# Patient Record
Sex: Male | Born: 1960
Health system: Southern US, Community
[De-identification: ages and names within clinical notes are randomized; demographics above are authoritative.]

## PROBLEM LIST (undated history)

## (undated) DIAGNOSIS — T7840XA Allergy, unspecified, initial encounter: Secondary | ICD-10-CM

## (undated) DIAGNOSIS — D239 Other benign neoplasm of skin, unspecified: Secondary | ICD-10-CM

## (undated) DIAGNOSIS — D179 Benign lipomatous neoplasm, unspecified: Secondary | ICD-10-CM

## (undated) DIAGNOSIS — I10 Essential (primary) hypertension: Secondary | ICD-10-CM

## (undated) DIAGNOSIS — K2210 Ulcer of esophagus without bleeding: Secondary | ICD-10-CM

## (undated) DIAGNOSIS — K219 Gastro-esophageal reflux disease without esophagitis: Secondary | ICD-10-CM

## (undated) HISTORY — PX: SHOULDER SURGERY: SHX246

## (undated) HISTORY — DX: Other benign neoplasm of skin, unspecified: D23.9

## (undated) HISTORY — PX: COLONOSCOPY: SHX174

## (undated) HISTORY — DX: Benign lipomatous neoplasm, unspecified: D17.9

## (undated) HISTORY — DX: Gastro-esophageal reflux disease without esophagitis: K21.9

## (undated) HISTORY — DX: Ulcer of esophagus without bleeding: K22.10

## (undated) HISTORY — DX: Allergy, unspecified, initial encounter: T78.40XA

## (undated) HISTORY — DX: Essential (primary) hypertension: I10

## (undated) HISTORY — PX: BICEPS TENDON REPAIR: SHX566

## (undated) HISTORY — PX: KNEE SURGERY: SHX244

---

## 1999-10-16 ENCOUNTER — Encounter: Admission: RE | Admit: 1999-10-16 | Discharge: 1999-11-15 | Payer: Self-pay | Admitting: Family Medicine

## 2004-07-10 ENCOUNTER — Ambulatory Visit: Payer: Self-pay | Admitting: Family Medicine

## 2004-10-21 ENCOUNTER — Ambulatory Visit: Payer: Self-pay | Admitting: Family Medicine

## 2004-12-03 ENCOUNTER — Ambulatory Visit: Payer: Self-pay | Admitting: Family Medicine

## 2005-02-06 ENCOUNTER — Ambulatory Visit: Payer: Self-pay | Admitting: Family Medicine

## 2006-04-15 ENCOUNTER — Ambulatory Visit: Payer: Self-pay | Admitting: Family Medicine

## 2006-04-29 ENCOUNTER — Ambulatory Visit: Payer: Self-pay | Admitting: Gastroenterology

## 2006-05-20 ENCOUNTER — Encounter (INDEPENDENT_AMBULATORY_CARE_PROVIDER_SITE_OTHER): Payer: Self-pay | Admitting: *Deleted

## 2006-05-20 ENCOUNTER — Ambulatory Visit: Payer: Self-pay | Admitting: Gastroenterology

## 2006-07-14 ENCOUNTER — Ambulatory Visit: Payer: Self-pay | Admitting: Gastroenterology

## 2006-07-29 ENCOUNTER — Ambulatory Visit: Payer: Self-pay | Admitting: Family Medicine

## 2006-08-28 ENCOUNTER — Ambulatory Visit: Payer: Self-pay | Admitting: Family Medicine

## 2007-01-13 ENCOUNTER — Ambulatory Visit: Payer: Self-pay | Admitting: Family Medicine

## 2007-01-13 LAB — CONVERTED CEMR LAB
ALT: 38 units/L (ref 0–40)
AST: 41 units/L — ABNORMAL HIGH (ref 0–37)
Alkaline Phosphatase: 44 units/L (ref 39–117)
BUN: 17 mg/dL (ref 6–23)
Basophils Absolute: 0 10*3/uL (ref 0.0–0.1)
Basophils Relative: 0.2 % (ref 0.0–1.0)
CO2: 34 meq/L — ABNORMAL HIGH (ref 19–32)
Cholesterol: 228 mg/dL (ref 0–200)
Creatinine, Ser: 0.9 mg/dL (ref 0.4–1.5)
Eosinophils Absolute: 0.3 10*3/uL (ref 0.0–0.6)
Eosinophils Relative: 6.4 % — ABNORMAL HIGH (ref 0.0–5.0)
HDL: 38.2 mg/dL — ABNORMAL LOW (ref >39.0)
MCHC: 35.6 g/dL (ref 30.0–36.0)
MCV: 83.9 fL (ref 78.0–100.0)
Monocytes Absolute: 0.4 10*3/uL (ref 0.2–0.7)
Neutro Abs: 2.8 10*3/uL (ref 1.4–7.7)
Platelets: 200 10*3/uL (ref 150–400)
Sodium: 143 meq/L (ref 135–145)
Total Protein: 7.5 g/dL (ref 6.0–8.3)
Triglycerides: 129 mg/dL (ref 0–149)
WBC: 5.1 10*3/uL (ref 4.5–10.5)

## 2007-01-21 ENCOUNTER — Ambulatory Visit: Payer: Self-pay | Admitting: Family Medicine

## 2007-02-10 ENCOUNTER — Ambulatory Visit: Payer: Self-pay | Admitting: Family Medicine

## 2007-02-22 ENCOUNTER — Encounter: Payer: Self-pay | Admitting: Family Medicine

## 2007-02-22 DIAGNOSIS — J309 Allergic rhinitis, unspecified: Secondary | ICD-10-CM

## 2007-02-22 DIAGNOSIS — I10 Essential (primary) hypertension: Secondary | ICD-10-CM | POA: Insufficient documentation

## 2007-02-22 DIAGNOSIS — J45909 Unspecified asthma, uncomplicated: Secondary | ICD-10-CM

## 2007-03-17 ENCOUNTER — Ambulatory Visit: Payer: Self-pay | Admitting: Family Medicine

## 2007-03-17 DIAGNOSIS — F528 Other sexual dysfunction not due to a substance or known physiological condition: Secondary | ICD-10-CM

## 2007-07-16 DIAGNOSIS — J069 Acute upper respiratory infection, unspecified: Secondary | ICD-10-CM | POA: Insufficient documentation

## 2007-07-20 ENCOUNTER — Ambulatory Visit: Payer: Self-pay | Admitting: Family Medicine

## 2007-11-21 DIAGNOSIS — J029 Acute pharyngitis, unspecified: Secondary | ICD-10-CM | POA: Insufficient documentation

## 2007-11-22 ENCOUNTER — Ambulatory Visit: Payer: Self-pay | Admitting: Family Medicine

## 2007-11-22 LAB — CONVERTED CEMR LAB: Rapid Strep: NEGATIVE

## 2008-01-14 ENCOUNTER — Ambulatory Visit: Payer: Self-pay | Admitting: Family Medicine

## 2008-01-14 LAB — CONVERTED CEMR LAB
ALT: 26 units/L (ref 0–53)
BUN: 14 mg/dL (ref 6–23)
Basophils Absolute: 0 10*3/uL (ref 0.0–0.1)
Calcium: 9.3 mg/dL (ref 8.4–10.5)
Creatinine, Ser: 0.8 mg/dL (ref 0.4–1.5)
Eosinophils Relative: 2.9 % (ref 0.0–5.0)
GFR calc Af Amer: 134 mL/min
HCT: 43.1 % (ref 39.0–52.0)
LDL Cholesterol: 131 mg/dL — ABNORMAL HIGH (ref 0–99)
Lymphocytes Relative: 20.9 % (ref 12.0–46.0)
MCHC: 34.9 g/dL (ref 30.0–36.0)
Neutro Abs: 4.7 10*3/uL (ref 1.4–7.7)
Neutrophils Relative %: 69.1 % (ref 43.0–77.0)
Potassium: 4.4 meq/L (ref 3.5–5.1)
Sodium: 140 meq/L (ref 135–145)
Total CHOL/HDL Ratio: 5.8
Triglycerides: 144 mg/dL (ref 0–149)
VLDL: 29 mg/dL (ref 0–40)
WBC: 6.8 10*3/uL (ref 4.5–10.5)

## 2008-01-20 ENCOUNTER — Encounter: Payer: Self-pay | Admitting: Family Medicine

## 2008-01-21 ENCOUNTER — Ambulatory Visit: Payer: Self-pay | Admitting: Family Medicine

## 2008-01-21 DIAGNOSIS — R0789 Other chest pain: Secondary | ICD-10-CM | POA: Insufficient documentation

## 2008-01-24 ENCOUNTER — Telehealth: Payer: Self-pay | Admitting: *Deleted

## 2008-01-24 ENCOUNTER — Telehealth (INDEPENDENT_AMBULATORY_CARE_PROVIDER_SITE_OTHER): Payer: Self-pay | Admitting: *Deleted

## 2008-01-28 ENCOUNTER — Ambulatory Visit: Payer: Self-pay | Admitting: Family Medicine

## 2008-01-31 ENCOUNTER — Ambulatory Visit: Payer: Self-pay | Admitting: Family Medicine

## 2008-01-31 DIAGNOSIS — D179 Benign lipomatous neoplasm, unspecified: Secondary | ICD-10-CM

## 2008-02-05 DIAGNOSIS — I781 Nevus, non-neoplastic: Secondary | ICD-10-CM

## 2008-02-07 ENCOUNTER — Ambulatory Visit: Payer: Self-pay | Admitting: Family Medicine

## 2008-05-08 ENCOUNTER — Ambulatory Visit: Payer: Self-pay | Admitting: Family Medicine

## 2009-06-21 ENCOUNTER — Ambulatory Visit: Payer: Self-pay | Admitting: Family Medicine

## 2009-06-21 DIAGNOSIS — F325 Major depressive disorder, single episode, in full remission: Secondary | ICD-10-CM | POA: Insufficient documentation

## 2009-06-28 ENCOUNTER — Ambulatory Visit: Payer: Self-pay | Admitting: Family Medicine

## 2009-07-23 ENCOUNTER — Ambulatory Visit: Payer: Self-pay | Admitting: Family Medicine

## 2009-07-23 LAB — CONVERTED CEMR LAB
Albumin: 4.2 g/dL (ref 3.5–5.2)
BUN: 22 mg/dL (ref 6–23)
Basophils Absolute: 0 10*3/uL (ref 0.0–0.1)
Basophils Relative: 0.7 % (ref 0.0–3.0)
CO2: 32 meq/L (ref 19–32)
Calcium: 9 mg/dL (ref 8.4–10.5)
Cholesterol: 193 mg/dL (ref 0–200)
Eosinophils Absolute: 0.2 10*3/uL (ref 0.0–0.7)
Eosinophils Relative: 5.7 % — ABNORMAL HIGH (ref 0.0–5.0)
Glucose, Bld: 94 mg/dL (ref 70–99)
HCT: 46 % (ref 39.0–52.0)
HDL: 39.4 mg/dL (ref >39.00)
Ketones, ur: NEGATIVE mg/dL
LDL Cholesterol: 140 mg/dL — ABNORMAL HIGH (ref 0–99)
Leukocytes, UA: NEGATIVE
Monocytes Relative: 17.3 % — ABNORMAL HIGH (ref 3.0–12.0)
Neutro Abs: 1.3 10*3/uL — ABNORMAL LOW (ref 1.4–7.7)
Neutrophils Relative %: 42.4 % — ABNORMAL LOW (ref 43.0–77.0)
Potassium: 4 meq/L (ref 3.5–5.1)
Sodium: 142 meq/L (ref 135–145)
Specific Gravity, Urine: >=1.03 (ref 1.000–1.030)
TSH: 0.65 microintl units/mL (ref 0.35–5.50)
Total Bilirubin: 1.5 mg/dL — ABNORMAL HIGH (ref 0.3–1.2)
Total CHOL/HDL Ratio: 5
Urobilinogen, UA: 0.2 (ref 0.0–1.0)
VLDL: 14 mg/dL (ref 0.0–40.0)

## 2009-07-30 ENCOUNTER — Ambulatory Visit: Payer: Self-pay | Admitting: Family Medicine

## 2009-07-30 DIAGNOSIS — R195 Other fecal abnormalities: Secondary | ICD-10-CM

## 2009-07-31 ENCOUNTER — Encounter (INDEPENDENT_AMBULATORY_CARE_PROVIDER_SITE_OTHER): Payer: Self-pay | Admitting: *Deleted

## 2009-09-19 ENCOUNTER — Encounter (INDEPENDENT_AMBULATORY_CARE_PROVIDER_SITE_OTHER): Payer: Self-pay | Admitting: *Deleted

## 2009-09-19 ENCOUNTER — Ambulatory Visit: Payer: Self-pay | Admitting: Gastroenterology

## 2009-09-19 DIAGNOSIS — K921 Melena: Secondary | ICD-10-CM

## 2009-09-19 DIAGNOSIS — K219 Gastro-esophageal reflux disease without esophagitis: Secondary | ICD-10-CM

## 2009-11-06 ENCOUNTER — Ambulatory Visit: Payer: Self-pay | Admitting: Gastroenterology

## 2009-11-06 LAB — HM COLONOSCOPY

## 2009-11-07 ENCOUNTER — Encounter: Payer: Self-pay | Admitting: Gastroenterology

## 2009-11-07 ENCOUNTER — Telehealth (INDEPENDENT_AMBULATORY_CARE_PROVIDER_SITE_OTHER): Payer: Self-pay | Admitting: *Deleted

## 2009-11-22 ENCOUNTER — Ambulatory Visit (HOSPITAL_COMMUNITY): Admission: RE | Admit: 2009-11-22 | Discharge: 2009-11-22 | Payer: Self-pay | Admitting: Gastroenterology

## 2010-09-10 NOTE — Letter (Signed)
Summary: [facility] Instructions  [facility]  [address] [place], [place] [address]   Phone: [phone]  Fax: [phone]       Eduardo Turner    Jan 29, 1961    MRN: [id number]        Procedure Day /Date: Tuesday March 8th, 2011     Arrival Time: 1:30pm     Procedure Time: 2:30pm     Location of Procedure:                    _x _  [facility] (4th Floor)                        PREPARATION FOR COLONOSCOPY WITH MOVIPREP   Starting 5 days prior to your procedure  10/11/09 do not eat nuts, seeds, popcorn, corn, beans, peas,  salads, or any raw vegetables.  Do not take any fiber supplements (e.g. Metamucil, Citrucel, and Benefiber).  THE DAY BEFORE YOUR PROCEDURE         DATE:  10/15/09   DAY:  Monday   1.  Drink clear liquids the entire day-NO SOLID FOOD  2.  Do not drink anything colored red or purple.  Avoid juices with pulp.  No orange juice.  3.  Drink at least 64 oz. (8 glasses) of fluid/clear liquids during the day to prevent dehydration and help the prep work efficiently.  CLEAR LIQUIDS INCLUDE: Water Jello Ice Popsicles Tea (sugar ok, no milk/cream) Powdered fruit flavored drinks Coffee (sugar ok, no milk/cream) Gatorade Juice: apple, white grape, white cranberry  Lemonade Clear bullion, consomm, broth Carbonated beverages (any kind) Strained chicken noodle soup Hard Candy                             4.  In the morning, mix first dose of MoviPrep solution:    Empty 1 Pouch A and 1 Pouch B into the disposable container    Add lukewarm drinking water to the top line of the container. Mix to dissolve    Refrigerate (mixed solution should be used within 24 hrs)  5.  Begin drinking the prep at 5:00 p.m. The MoviPrep container is divided by 4 marks.   Every 15 minutes drink the solution down to the next mark (approximately 8 oz) until the full liter is complete.   6.  Follow completed prep with 16 oz of clear liquid of your choice  (Nothing red or purple).  Continue to drink clear liquids until bedtime.  7.  Before going to bed, mix second dose of MoviPrep solution:    Empty 1 Pouch A and 1 Pouch B into the disposable container    Add lukewarm drinking water to the top line of the container. Mix to dissolve    Refrigerate  THE DAY OF YOUR PROCEDURE      DATE:  10/16/09  DAY:  Tuesday  Beginning at  9:30 a.m. (5 hours before procedure):         1. Every 15 minutes, drink the solution down to the next mark (approx 8 oz) until the full liter is complete.  2. Follow completed prep with 16 oz. of clear liquid of your choice.    3. You may drink clear liquids until  12:30pm  (2 HOURS BEFORE PROCEDURE).   MEDICATION INSTRUCTIONS  Unless otherwise instructed, you should take regular prescription medications with a small sip of  water   as early as possible the morning of your procedure.           OTHER INSTRUCTIONS  You will need a responsible adult at least 49 years of age to accompany you and drive you home.   This person must remain in the waiting room during your procedure.  Wear loose fitting clothing that is easily removed.  Leave jewelry and other valuables at home.  However, you may wish to bring a book to read or  an iPod/MP3 player to listen to music as you wait for your procedure to start.  Remove all body piercing jewelry and leave at home.  Total time from sign-in until discharge is approximately 2-3 hours.  You should go home directly after your procedure and rest.  You can resume normal activities the  day after your procedure.  The day of your procedure you should not:   Drive   Make legal decisions   Operate machinery   Drink alcohol   Return to work  You will receive specific instructions about eating, activities and medications before you leave.    The above instructions have been reviewed and explained to me by   Marchelle Folks.     I fully understand and can verbalize these  instructions _____________________________ Date _________

## 2010-09-10 NOTE — Procedures (Signed)
Summary: Colonoscopy  Patient: Eduardo Turner Note: All result statuses are Final unless otherwise noted.  Tests: (1) Colonoscopy (COL)   COL Colonoscopy           DONE     [facility]     [address]     [place], [place]  [address]           COLONOSCOPY PROCEDURE REPORT           PATIENT:  Eduardo Turner, Eduardo Turner  MR#:  [id number]     BIRTHDATE:  [birth year], 48 yrs. old  GENDER:  male           ENDOSCOPIST:  Judie Petit T. [name], MD, [facility]     Referred by:  Eugenio Hoes Tawanna Cooler, M.D.           PROCEDURE DATE:  11/06/2009     PROCEDURE:  Colonoscopy 19147     ASA CLASS:  Class II     INDICATIONS:  1) hematochezia           MEDICATIONS:   Fentanyl 125 mcg IV, Versed 13 mg IV           DESCRIPTION OF PROCEDURE:   After the risks benefits and     alternatives of the procedure were thoroughly explained, informed     consent was obtained.  Digital rectal exam was performed and     revealed no abnormalities.  The LB PCF-[id number] endoscope     was introduced through the anus and advanced to the hepatic     flexure, limited by a tortuous and redundant colon, and patient     discomfort.  The quality of the prep was excellent, using     MoviPrep.  The instrument was then slowly withdrawn as the colon     was fully examined.     <<PROCEDUREIMAGES>>           FINDINGS:  The hepatic flexure, transverse, splenic flexure,     descending, sigmoid colon, and rectum appeared unremarkable.     Retroflexed views in the rectum revealed no abnormalities.  The     time to cecum =  18.25  minutes. The scope was then withdrawn     (time =  7  min) from the patient and the procedure completed.           COMPLICATIONS:  None           ENDOSCOPIC IMPRESSION:     1) Normal colon           RECOMMENDATIONS:     1) Air Contrast Barium Enema in 2-3 weeks           Cymone Yeske T. [name], MD, [facility] Graham           n.     eSIGNED:   Venita Lick. Lis Savitt at 11/06/2009 03:15 PM           Ramond Dial, [id number]  Note:  An exclamation mark (!) indicates a result that was not dispersed into the flowsheet. Document Creation Date: 11/06/2009 3:16 PM _______________________________________________________________________  (1) Order result status: Final Collection or observation date-time: 11/06/2009 15:07 Requested date-time:  Receipt date-time:  Reported date-time:  Referring Physician:   Ordering Physician: Claudette Head [phone]) Specimen Source:  Source: Launa Grill Order Number: [id number] Lab site:

## 2010-09-10 NOTE — Assessment & Plan Note (Signed)
Summary: INTERMITTENT RECTAL BLEED X12MONTHS/YF   History of Present Illness Visit Type: Initial Consult Primary GI MD: Elie Goody MD [facility] Primary Provider: Kelle Darting, MD Requesting Provider: Kelle Darting, MD Chief Complaint: Patient complains of rectal bleeding that started about 6 months ago, which has increased recently. He denies any other complaints, no constipation, no hemorrhoids.  History of Present Illness:   This is a 50 year oldwhite male who relates an increasing frequency of bright red blood per rectum year. He is noted. The symptoms for about 6 months, but they have increased in the past few weeks. He frequently notes blood on the tissue and in the commode. He has a history of a peptic esophageal stricture and has breakthrough reflux symptoms occurring about every 2-3 days.   GI Review of Systems      Denies abdominal pain, acid reflux, belching, bloating, chest pain, dysphagia with liquids, dysphagia with solids, heartburn, loss of appetite, nausea, vomiting, vomiting blood, weight loss, and  weight gain.      Reports rectal bleeding.     Denies anal fissure, black tarry stools, change in bowel habit, constipation, diarrhea, diverticulosis, fecal incontinence, heme positive stool, hemorrhoids, irritable bowel syndrome, jaundice, light color stool, liver problems, and  rectal pain.   Current Medications (verified): 1)  Azmacort 75 Mcg/act Aers (Triamcinolone Acetonide) .... Prn 2)  Maxair Autohaler 200 Mcg/inh Aerb (Pirbuterol Acetate) .... Prn 3)  Prilosec 20 Mg Cpdr (Omeprazole) .... One Daily 4)  Claritin 10 Mg  Caps (Loratadine) .... As Needed For Seasonal Allergies 5)  Lisinopril 20 Mg  Tabs (Lisinopril) .... Take 1 Tablet By Mouth Every Morning 6)  Celexa 20 Mg Tabs (Citalopram Hydrobromide) .[name].. 1 Tab @ Bedtime  Allergies (verified): 1)  Erythromycin Ethylsuccinate  Past History:  Past Medical History: Allergic  rhinitis Asthma Hypertension lipomas Dysplastic nevi Peptic esophageal stricture GERD  Past Surgical History: bilateral SHOULDER SURG  Family History: Reviewed history from 09/18/2009 and no changes required. late 50s in good health.  Mother, her mid 28s in good health.  No brothers.  One sister in good health  bipolar depression No FH of Colon Cancer  Social History: Occupation: Production designer, theatre/television/film at [employer] Married Never Smoked Drug use-no Regular exercise-yes Alcohol Use - yes social  Daily Caffeine Use 4-5 cans per day  Review of Systems       The patient complains of fatigue, headaches-new, muscle pains/cramps, shortness of breath, sleeping problems, and urination - excessive.         The pertinent positives and negatives are noted as above and in the HPI. All other ROS were reviewed and were negative.   Vital Signs:  Patient profile:   50 year old male Height:      69.5 inches Weight:      219.0 pounds BMI:     31.99 Pulse rate:   64 / minute Pulse rhythm:   regular BP sitting:   114 / 74  (left arm) Cuff size:   regular  Vitals Entered By: Harlow Mares CMA Duncan Dull) (September 19, 2009 1:50 PM)  Physical Exam  General:  Well developed, well nourished, no acute distress. Head:  Normocephalic and atraumatic. Eyes:  PERRLA, no icterus. Ears:  Normal auditory acuity. Mouth:  No deformity or lesions, dentition normal. Neck:  Supple; no masses or thyromegaly. Lungs:  Clear throughout to auscultation. Heart:  Regular rate and rhythm; no murmurs, rubs,  or bruits. Abdomen:  Soft, nontender and nondistended. No masses, hepatosplenomegaly or hernias  noted. Normal bowel sounds. Rectal:  deferred until time of colonoscopy.  DRE performed by Dr. Tawanna Cooler 6 weeks ago showed no lesions and Hemoccult-positive stool. Msk:  Symmetrical with no gross deformities. Normal posture. Pulses:  Normal pulses noted. Extremities:  No clubbing, cyanosis, edema or deformities noted. Neurologic:   Alert and  oriented x4;  grossly normal neurologically. Skin:  Intact without significant lesions or rashes. Cervical Nodes:  No significant cervical adenopathy. Axillary Nodes:  No significant axillary adenopathy. Psych:  Alert and cooperative. Normal mood and affect.  Impression & Recommendations:  Problem # 1:  HEMATOCHEZIA (ICD-578.1) Worsening small-volume hematochezia. Rule out hemorrhoids, proctitis, colorectal neoplasms, and other disorders. The risks, benefits and alternatives to colonoscopy with possible biopsy, possible destruction of hemorrhoids and possible polypectomy were discussed with the patient and they consent to proceed. The procedure will be scheduled electively. Orders: Colonoscopy (Colon)  Problem # 2:  BLOOD IN STOOL, OCCULT (ICD-792.1) As in problem #1.  Problem # 3:  GERD (ICD-530.81) GERD with a history of prior peptic stricture. Frequent breakthrough symptoms. Intensify antireflux measures. Change omeprazole 40 mg q.a.m.  Patient Instructions: 1)  Colonoscopy brochure given.  2)  Conscious Sedation brochure given.  3)  Pick up your prescription at your pharmacy.  4)  Copy sent to : Kelle Darting, MD 5)  The medication list was reviewed and reconciled.  All changed / newly prescribed medications were explained.  A complete medication list was provided to the patient / caregiver. 6)  Avoid foods high in acid content ( tomatoes, citrus juices, spicy foods) . Avoid eating within 3 to 4 hours of lying down or before exercising. Do not over eat; try smaller more frequent meals. Elevate head of bed four inches when sleeping.   Prescriptions: OMEPRAZOLE 40 MG CPDR (OMEPRAZOLE) one tablet by mouth once daily  #30 x 11   Entered by:   Christie Nottingham CMA (AAMA)   Authorized by:   Meryl Dare MD [facility]   Signed by:   Meryl Dare MD [facility] on 09/19/2009   Method used:   Electronically to        [facility]* (retail)       [address] [place], [place]  [address]       Ph: [phone]       Fax: [phone]   RxID:   [id number] MOVIPREP 100 GM  SOLR (PEG-KCL-NACL-NASULF-NA ASC-C) As per prep instructions.  #1 x 0   Entered by:   Christie Nottingham CMA (AAMA)   Authorized by:   Meryl Dare MD [facility]   Signed by:   Meryl Dare MD [facility] on 09/19/2009   Method used:   Electronically to        [facility]* (retail)       [address] [place], [place]  [address]       Ph: [phone]       Fax: [phone]   RxID:   [id number]

## 2010-09-10 NOTE — Miscellaneous (Signed)
Summary: ac. Barium Enema order  Clinical Lists Changes  Orders: Added new Test order of Air Contrast  BE (Air Contrast BE) - Signed

## 2010-09-10 NOTE — Procedures (Signed)
Summary: EGD and biopsy   EGD  Procedure date:  05/20/2006  Findings:      Findings: Stricture:  Location: [facility]   Patient Name: Eduardo Turner, Eduardo Turner. MRN:  Procedure Procedures: Panendoscopy (EGD) CPT: 43235.    with biopsy(s)/brushing(s). CPT: D1846139.    with esophageal dilation. CPT: G9296129.  Personnel: Endoscopist: Venita Lick. [name], MD, [facility] Graham.  Referred By: Eugenio Hoes Tawanna Cooler, MD.  Exam Location: Exam performed in Outpatient Clinic. Outpatient  Patient Consent: Procedure, Alternatives, Risks and Benefits discussed, consent obtained, from patient. Consent was obtained by the RN.  Indications Symptoms: Dysphagia. Reflux symptoms  History  Current Medications: Patient is not currently taking Coumadin.  Pre-Exam Physical: Performed May 20, 2006  Cardio-pulmonary exam, HEENT exam, Abdominal exam, Mental status exam WNL.  Comments: Pt. history reviewed/updated, physical exam performed prior to initiation of sedation?Yes Exam Exam Info: Maximum depth of insertion Duodenum, intended Duodenum. Vocal cords not visualized. Gastric retroflexion performed. ASA Classification: II. Tolerance: excellent.  Sedation Meds: Patient assessed and found to be appropriate for moderate (conscious) sedation. Fentanyl 75 mcg. given IV. Versed 7 mg. given IV. Cetacaine Spray 2 sprays given aerosolized.  Monitoring: BP and pulse monitoring done. Oximetry used. Supplemental O2 given  Findings Normal: Proximal Esophagus to Mid Esophagus.  STRICTURE / STENOSIS: Stricture in Distal Esophagus.  39 cm from mouth. Lumen diameter is 15 mm. Biopsy of Stricture/Steno  taken. ICD9: Esophageal Stricture: 530.3.  - Dilation: Distal Esophagus. Savary dilator used, Diameter: 16 mm, Minimal Resistance, No Heme present on extraction. Savary dilator used, Diameter: 17 mm, Minimal Resistance, No Heme present on extraction. Patient tolerance excellent. Outcome: successful.  Normal: Cardia to  Duodenal 2nd Portion.   Assessment  Diagnoses: 530.3: Esophageal Stricture.   Events  Unplanned Intervention: No unplanned interventions were required.  Unplanned Events: There were no complications. Plans Instructions: Nothing to eat or drink for 1 hour.  Clear or full liquids: 1 hour.  Medication(s): Await pathology. PPI: QAM, for indefinitely.   Patient Education: Patient given standard instructions for: Reflux. Stenosis / Stricture.  Disposition: After procedure patient sent to recovery. After recovery patient sent home.  Scheduling: Office Visit, to [employer]. [name], MD, [facility], around Jun 20, 2006.    This report was created from the original endoscopy report, which was reviewed and signed by the above listed endoscopist.    cc: Tinnie Gens A. Tawanna Cooler, MD       SP Surgical Pathology - STATUS: Final             By: Gwenlyn Saran  ,        Perform Date: 10Oct07 00:01  Ordered By: Rica Records Date: 11Oct07 14:39  Facility: [facility]                               Department: CPATH  Service Report Text  [facility]   [address] [address]   [place], [place] [address]   Telephone [phone] or [phone] Fax [phone]    REPORT OF SURGICAL PATHOLOGY    Case #: [id number]   Patient Name: Eduardo Turner   Office Chart Number: [id number]    MRN: [id number]   Pathologist: Berneta Levins, MD   DOB/Age 07-Jul-1961 (Age: 50) Gender: M   Date Taken: 05/20/2006   Date Received: 05/21/2006    FINAL DIAGNOSIS    ***  MICROSCOPIC EXAMINATION AND DIAGNOSIS***    ESOPHAGUS: FINDINGS CONSISTENT WITH GASTROESOPHAGEAL REFLUX. NO   INTESTINAL METAPLASIA, DYSPLASIA OR MALIGNANCY IDENTIFIED.   (BIOPSIES, DISTAL STRICTURE)    COMMENT   An Alcian Blue stain is performed to determine the presence of   intestinal metaplasia (goblet cell metaplasia). No intestinal   metaplasia (goblet cell metaplasia) is identified with the  Alcian   Blue stain. The control stained appropriately. There is   gastroesophageal junction type mucosa associated with mild   inflammation. Intraepithelial eosinophils are seen in the   squamous component consistent with gastroesophageal reflux   disease in the appropriate clinical setting. There is no atypia   or evidence of malignancy. (JAS:mj 05/22/06)    mj   Date Reported: 05/22/2006 Berneta Levins, MD   *** Electronically Signed Out By JAS ***    Clinical information   R/O peptic stricture; dysphagia/GERD (gt)    specimen(s) obtained   Esophagus, biopsy, distal stricture    Gross Description   Received in formalin are tan, soft tissue fragments that are   submitted in toto. Number: three   Size: 0.1 to 0.4 cm. block summary: toto one cassette. (JBM:gt,   05/21/06)    gdt/

## 2010-09-10 NOTE — Progress Notes (Signed)
   Phone Note Outgoing Call   Summary of Call: Pt. contacted and scheduled for [facility] Barium Enema on 11/22/09 at 9 a.m.per order of Dr.Stark.

## 2010-09-20 ENCOUNTER — Encounter: Payer: Self-pay | Admitting: Family Medicine

## 2010-09-20 ENCOUNTER — Ambulatory Visit (INDEPENDENT_AMBULATORY_CARE_PROVIDER_SITE_OTHER): Payer: BC Managed Care – PPO | Admitting: Family Medicine

## 2010-09-20 VITALS — BP 120/88 | Temp 97.9°F | Ht 69.0 in | Wt 236.0 lb

## 2010-09-20 DIAGNOSIS — G43109 Migraine with aura, not intractable, without status migrainosus: Secondary | ICD-10-CM

## 2010-09-20 DIAGNOSIS — G43809 Other migraine, not intractable, without status migrainosus: Secondary | ICD-10-CM

## 2010-09-20 NOTE — Progress Notes (Signed)
  Subjective:    Patient ID: Eduardo Turner, male    DOB: 11-12-1960, 50 y.o.   MRN: [id number]  HPICarl is a 50 year old male, who comes in today for evaluation of ophthalmic migraine.  He drinks 5 cans of caffeinated Coca-Cola per day.  This morning at work.  He had a 15 minute episodes of blurred vision without a headache.  It went away and then came back.  Again, no headache.  Neurologic review of systems negative except that he states 20 years ago.  He had increased CSF pressure from taking steroids for asthma and had to have a spinal tap to relieve the pressure.  He set a history of many migraines in the past.  Negative, except for above    Review of Systems     Objective:   Physical Exam    Well-developed well-nourished, male in no acute distress.  Examination of the HEENT was negative.  Neck was supple.  Neurologic exam normal.  Disk flat    Assessment & Plan:  Ophthalmic migraine.  Caffeine abuse.  Plan slowly decreased her caffeine over a couple weeks.  Return p.r.n.

## 2010-09-20 NOTE — Patient Instructions (Signed)
Slowly decreased her caffeine consumption over the next couple weeks, and the symptoms u described  should go away if they do not have become worse and let us know

## 2010-09-25 ENCOUNTER — Other Ambulatory Visit: Payer: Self-pay | Admitting: Family Medicine

## 2010-09-25 ENCOUNTER — Telehealth: Payer: Self-pay | Admitting: Family Medicine

## 2010-09-25 ENCOUNTER — Telehealth: Payer: Self-pay | Admitting: *Deleted

## 2010-09-25 DIAGNOSIS — L509 Urticaria, unspecified: Secondary | ICD-10-CM

## 2010-09-25 MED ORDER — PREDNISONE 20 MG PO TABS: 20.0000 mg | ORAL_TABLET | Freq: Every day | ORAL | Status: AC

## 2010-09-25 NOTE — Telephone Encounter (Signed)
Please advise 

## 2010-09-25 NOTE — Telephone Encounter (Signed)
Left message on machine for patient and rx sent

## 2010-09-25 NOTE — Telephone Encounter (Signed)
Has poison ivy, requesting rx sent to [facility] in [place]

## 2010-09-25 NOTE — Telephone Encounter (Signed)
Prednisone 20 mg, dispense 30 tabs directions two tabs x 3 days, one x 3 days, a half x 3 days, then half a tablet Monday, Wednesday, Friday, for a two week taper, refills x 1 

## 2010-09-25 NOTE — Telephone Encounter (Signed)
Appt scheduled for today.

## 2010-09-26 ENCOUNTER — Ambulatory Visit: Payer: BC Managed Care – PPO | Admitting: Family Medicine

## 2010-10-28 ENCOUNTER — Other Ambulatory Visit: Payer: Self-pay | Admitting: Family Medicine

## 2010-11-05 ENCOUNTER — Other Ambulatory Visit: Payer: Self-pay | Admitting: Gastroenterology

## 2010-12-25 ENCOUNTER — Other Ambulatory Visit: Payer: Self-pay | Admitting: Family Medicine

## 2010-12-27 NOTE — Assessment & Plan Note (Signed)
[facility]                           GASTROENTEROLOGY OFFICE NOTE   Eduardo Turner, Eduardo Turner                        MRN:          [id number]  DATE:04/29/2006                            DOB:          [birth year]    REFERRING PHYSICIAN:  Tinnie Gens A. Tawanna Cooler, MD   REASON FOR REFERRAL:  Dysphagia and heartburn.   HISTORY OF PRESENT ILLNESS:  Eduardo Turner is a very nice 50 year old white  male referred through the courtesy of Eduardo Turner.  He relates a  history of reflux symptoms for about the past 2-3 years.  He has been taking  either generic omeprazole or Prilosec OTC for about the past 2 years with  fairly good control of his symptoms.  Over the past several months, though  he does not occasionally breakthrough symptoms despite remaining on Prilosec  quite regularly.  Over the past 2 months he has had several episodes of  solid food dysphagia.  These symptoms are associated with significant chest  discomfort until the food passes.  He has noted occasional mild  periumbilical pain and some rare episodes of a scant amount of blood on the  tissue paper occurring maybe every 3-4 months.  He is most concerned about  his swallowing difficulties and chest pain.  He notes no change in stool  caliber, odynophagia, or weight loss.  There is no family history of colon  cancer, colon polyps, or inflammatory bowel disease.   CURRENT MEDICATIONS:  Listed on the chart are being reviewed.   MEDICATION ALLERGIES:  ERYTHROMYCIN.   PAST MEDICAL HISTORY:  1. Asthma.  2. Allergies.  3. GERD.  4. History of an arm fracture as a child.   SOCIAL HISTORY:  Per the handwritten evaluation form.   REVIEW OF SYSTEMS:  Per the handwritten evaluation form.   PHYSICAL EXAMINATION:  GENERAL:  No acute distress.  VITAL SIGNS:  Height 5 feet 9 inches, weight 215.4 pounds, blood pressure  128/86, pulse 70 regular.  HEENT:  Anicteric sclerae, oropharynx clear.  CHEST:  Clear to  auscultation bilaterally.  CARDIAC:  Regular rate and rhythm without murmurs.  ABDOMEN:  Soft, nontender, nondistended.  Normoactive bowel sounds.  No  palpable organomegaly, masses, or hernias.  EXTREMITIES:  Without clubbing, cyanosis, or edema.  NEUROLOGIC:  Alert and oriented x3.  Grossly nonfocal.   ASSESSMENT/PLAN:  Chronic GERD with new onset solid food dysphagia.  Rule  out peptic strictures and esophagitis.  Discontinue Prilosec and begin  Prevacid 30 mg p.o. q.a.m.  Begin all standard anti-reflux measures.  Risks,  benefits and alternatives to upper endoscopy with possible biopsy, possible  dilation discussed with the patient and he consents to proceed.  This will  be scheduled electively.                                   Venita Lick. [name], MD, [facility]   MTS/MedQ  DD:  04/29/2006  DT:  05/01/2006  Job #:  [id number]   cc:   Eduardo Gens A. Tawanna Cooler,  MD

## 2010-12-27 NOTE — Assessment & Plan Note (Signed)
[name]                         GASTROENTEROLOGY OFFICE NOTE   Eduardo Turner, Eduardo Turner                        MRN:          [id number]  DATE:07/14/2006                            DOB:          [birth year]    This is a return office visit for reflux and dysphagia.  Upper endoscopy  revealed a peptic stricture, which was dilated.  Biopsies revealed  evidence of GERD with no other abnormalities.  He has had an excellent  response to dilation and notes no dysphagia.  His reflux symptoms are  under very good control on daily Prilosec OTC.   CURRENT MEDICATIONS:  Listed on the chart, updated, and reviewed.   MEDICATION ALLERGIES:  ERYTHROMYCIN.   EXAM:  In no acute distress.  Weight 221 pounds.  Blood pressure is 136/80, pulse 68 and regular.  CHEST:  Clear to auscultation bilaterally.  CARDIAC:  Regular rate and rhythm without murmurs.  ABDOMEN:  Soft and nontender with normoactive bowel sounds.   ASSESSMENT AND PLAN:  Gastroesophageal reflux disease complicated by a  peptic stricture.  Maintain standard antireflux measures and a proton  pump inhibitor on a daily basis, long term.  Return office visit with me  p.r.n.  Ongoing followup with Dr. Kelle Darting.     Venita Lick. [name], MD, [facility]  Electronically Signed    MTS/MedQ  DD: 07/14/2006  DT: 07/14/2006  Job #: [id number]   cc:   Tinnie Gens A. Tawanna Cooler, MD

## 2011-01-23 ENCOUNTER — Other Ambulatory Visit: Payer: BC Managed Care – PPO

## 2011-01-30 ENCOUNTER — Encounter: Payer: BC Managed Care – PPO | Admitting: Family Medicine

## 2011-03-12 ENCOUNTER — Other Ambulatory Visit (INDEPENDENT_AMBULATORY_CARE_PROVIDER_SITE_OTHER): Payer: BC Managed Care – PPO

## 2011-03-12 DIAGNOSIS — Z0000 Encounter for general adult medical examination without abnormal findings: Secondary | ICD-10-CM

## 2011-03-12 LAB — BASIC METABOLIC PANEL
BUN: 18 mg/dL (ref 6–23)
Calcium: 8.8 mg/dL (ref 8.4–10.5)
Chloride: 100 mEq/L (ref 96–112)
Glucose, Bld: 83 mg/dL (ref 70–99)
Sodium: 140 mEq/L (ref 135–145)

## 2011-03-12 LAB — CBC WITH DIFFERENTIAL/PLATELET
HCT: 44.6 % (ref 39.0–52.0)
Lymphocytes Relative: 24.3 % (ref 12.0–46.0)
Lymphs Abs: 1.8 10*3/uL (ref 0.7–4.0)
Monocytes Relative: 5.6 % (ref 3.0–12.0)
Neutrophils Relative %: 66 % (ref 43.0–77.0)
RBC: 5.13 Mil/uL (ref 4.22–5.81)
WBC: 7.3 10*3/uL (ref 4.5–10.5)

## 2011-03-12 LAB — POCT URINALYSIS DIPSTICK
Glucose, UA: NEGATIVE
Spec Grav, UA: 1.03

## 2011-03-12 LAB — LIPID PANEL
Cholesterol: 220 mg/dL — ABNORMAL HIGH (ref 0–200)
Triglycerides: 217 mg/dL — ABNORMAL HIGH (ref 0.0–149.0)
VLDL: 43.4 mg/dL — ABNORMAL HIGH (ref 0.0–40.0)

## 2011-03-13 ENCOUNTER — Other Ambulatory Visit: Payer: BC Managed Care – PPO

## 2011-03-13 LAB — HEPATIC FUNCTION PANEL
AST: 26 U/L (ref 0–37)
Alkaline Phosphatase: 47 U/L (ref 39–117)
Total Bilirubin: 0.8 mg/dL (ref 0.3–1.2)

## 2011-03-27 ENCOUNTER — Ambulatory Visit (INDEPENDENT_AMBULATORY_CARE_PROVIDER_SITE_OTHER): Payer: BC Managed Care – PPO | Admitting: Family Medicine

## 2011-03-27 ENCOUNTER — Encounter: Payer: Self-pay | Admitting: Family Medicine

## 2011-03-27 DIAGNOSIS — J309 Allergic rhinitis, unspecified: Secondary | ICD-10-CM

## 2011-03-27 DIAGNOSIS — K219 Gastro-esophageal reflux disease without esophagitis: Secondary | ICD-10-CM

## 2011-03-27 DIAGNOSIS — Z0000 Encounter for general adult medical examination without abnormal findings: Secondary | ICD-10-CM

## 2011-03-27 DIAGNOSIS — F329 Major depressive disorder, single episode, unspecified: Secondary | ICD-10-CM

## 2011-03-27 DIAGNOSIS — I10 Essential (primary) hypertension: Secondary | ICD-10-CM

## 2011-03-27 DIAGNOSIS — F3289 Other specified depressive episodes: Secondary | ICD-10-CM

## 2011-03-27 MED ORDER — CITALOPRAM HYDROBROMIDE 20 MG PO TABS: 20.0000 mg | ORAL_TABLET | Freq: Every day | ORAL | Status: DC

## 2011-03-27 MED ORDER — LISINOPRIL 20 MG PO TABS: 20.0000 mg | ORAL_TABLET | Freq: Every day | ORAL | Status: DC

## 2011-03-27 NOTE — Progress Notes (Signed)
  Subjective:    Patient ID: Eduardo Turner, male    DOB: 08-24-1960, 50 y.o.   MRN: [id number]  HPIkarl Is a 50 year old, married male, nonsmoker, who comes in today for evaluation of depression, hypertension, allergic rhinitis and reflux esophagitis.  He takes Celexa 20 mg nightly for mild depression.  Doing well.  He takes 20 mg of lisinopril daily.  BP 120/88.  He takes OTC Claritin 10 mg plain for allergic rhinitis.  He takes OTC Prilosec 40 mg daily for reflux.  Review of systems negative except 49 year old daughter Jerrel Ivory is not doing well in school and is changed.  Groups.  Advised to see Dr.  Marvell Fuller  for evaluation immediately    Review of Systems  Constitutional: Negative.   HENT: Negative.   Eyes: Negative.   Respiratory: Negative.   Cardiovascular: Negative.   Gastrointestinal: Negative.   Genitourinary: Negative.   Musculoskeletal: Negative.   Skin: Negative.   Neurological: Negative.   Hematological: Negative.   Psychiatric/Behavioral: Negative.        Objective:   Physical Exam  Constitutional: He is oriented to person, place, and time. He appears well-developed and well-nourished.  HENT:  Head: Normocephalic and atraumatic.  Right Ear: External ear normal.  Left Ear: External ear normal.  Nose: Nose normal.  Mouth/Throat: Oropharynx is clear and moist.  Eyes: Conjunctivae and EOM are normal. Pupils are equal, round, and reactive to light.  Neck: Normal range of motion. Neck supple. No JVD present. No tracheal deviation present. No thyromegaly present.  Cardiovascular: Normal rate, regular rhythm, normal heart sounds and intact distal pulses.  Exam reveals no gallop and no friction rub.   No murmur heard. Pulmonary/Chest: Effort normal and breath sounds normal. No stridor. No respiratory distress. He has no wheezes. He has no rales. He exhibits no tenderness.  Abdominal: Soft. Bowel sounds are normal. He exhibits no distension and no mass. There is no  tenderness. There is no rebound and no guarding.  Genitourinary: Rectum normal, prostate normal and penis normal. Guaiac negative stool. No penile tenderness.  Musculoskeletal: Normal range of motion. He exhibits no edema and no tenderness.  Lymphadenopathy:    He has no cervical adenopathy.  Neurological: He is alert and oriented to person, place, and time. He has normal reflexes. No cranial nerve deficit. He exhibits normal muscle tone.  Skin: Skin is warm and dry. No rash noted. No erythema. No pallor.  Psychiatric: He has a normal mood and affect. His behavior is normal. Judgment and thought content normal.          Assessment & Plan:  Healthy male.  History of mild depression continue Celexa 20 nightly  Hypertension.  Continue lisinopril 20 daily  Allergic rhinitis.  Continue Claritin 10 daily.  Reflux esophagitis.  Continue on omeprazole 40 daily OTC.  Return in one year, sooner if any problems

## 2011-03-27 NOTE — Patient Instructions (Signed)
Continue your current medications.  Return in one year, sooner for any problems.  Take a baby aspirin daily

## 2011-05-05 ENCOUNTER — Ambulatory Visit (INDEPENDENT_AMBULATORY_CARE_PROVIDER_SITE_OTHER): Payer: BC Managed Care – PPO | Admitting: Family Medicine

## 2011-05-05 ENCOUNTER — Encounter: Payer: Self-pay | Admitting: Family Medicine

## 2011-05-05 VITALS — BP 120/76 | HR 72 | Temp 98.5°F | Wt 230.0 lb

## 2011-05-05 DIAGNOSIS — IMO0002 Reserved for concepts with insufficient information to code with codable children: Secondary | ICD-10-CM

## 2011-05-05 MED ORDER — CEPHALEXIN 500 MG PO CAPS: 500.0000 mg | ORAL_CAPSULE | Freq: Three times a day (TID) | ORAL | Status: AC

## 2011-05-05 NOTE — Progress Notes (Signed)
  Subjective:    Patient ID: Eduardo Turner, male    DOB: April 08, 1961, 50 y.o.   MRN: [id number]  HPI Here for one week of a painful swollen left thumb. No hx of trauma.    Review of Systems  Constitutional: Negative.   Skin: Positive for color change.       Objective:   Physical Exam  Constitutional: He appears well-developed and well-nourished.  Skin:       The skin at the base of the left thumb is red, swollen, and tender          Assessment & Plan:  Use hot soaks with Epsom salts.

## 2011-11-11 ENCOUNTER — Telehealth: Payer: Self-pay | Admitting: Family Medicine

## 2011-11-11 NOTE — Telephone Encounter (Signed)
Pt called and said that he woke up yesterday morning and his rt elbow is swollen and possibly has some fluid in it. Pt said that it feels warm to touch, redness, and is painful. Pt req call back from nurse asap and would like a work in ov to see Dr Tawanna Cooler tomorrow, if possible.

## 2011-11-11 NOTE — Telephone Encounter (Signed)
Appointment made

## 2011-11-12 ENCOUNTER — Ambulatory Visit (INDEPENDENT_AMBULATORY_CARE_PROVIDER_SITE_OTHER): Payer: BC Managed Care – PPO | Admitting: Family Medicine

## 2011-11-12 ENCOUNTER — Encounter: Payer: Self-pay | Admitting: Family Medicine

## 2011-11-12 VITALS — BP 130/80 | Temp 98.3°F | Wt 234.0 lb

## 2011-11-12 DIAGNOSIS — IMO0002 Reserved for concepts with insufficient information to code with codable children: Secondary | ICD-10-CM

## 2011-11-12 DIAGNOSIS — L03119 Cellulitis of unspecified part of limb: Secondary | ICD-10-CM

## 2011-11-12 MED ORDER — DOXYCYCLINE HYCLATE 100 MG PO TABS: 100.0000 mg | ORAL_TABLET | Freq: Two times a day (BID) | ORAL | Status: AC

## 2011-11-12 MED ORDER — CEFTRIAXONE SODIUM 1 G IJ SOLR
1.0000 g | INTRAMUSCULAR | Status: AC
  Administered 2011-11-12: 1 g via INTRAMUSCULAR

## 2011-11-12 MED ORDER — SULFAMETHOXAZOLE-TRIMETHOPRIM 800-160 MG PO TABS: 1.0000 | ORAL_TABLET | Freq: Two times a day (BID) | ORAL | Status: AC

## 2011-11-12 NOTE — Patient Instructions (Signed)
Doxycycline and Septra one of each twice daily  Continuous elevation of your right arm with heating pad low heat,,,,,,,,, remember to wrap a towel around her arm and put the heating pad over-  Return Thursday afternoon for followup

## 2011-11-12 NOTE — Progress Notes (Signed)
  Subjective:    Patient ID: Eduardo Turner, male    DOB: 18-Jul-1961, 51 y.o.   MRN: [id number]  HPI Eduardo Turner is a 51 year old married male nonsmoker who comes in today for atraumatic swelling of his right elbow for 2 days  He is from he woke up the other day his elbow is a little sore yesterday began turning red. No history of trauma   Review of Systems Gen. an infectious review of systems negative no fever chills    Objective:   Physical Exam Well-developed well nourished male in no acute distress examination arm is normal except for some superficial erythema around the elbow full range of motion of his joints       Assessment & Plan:  Superficial cellulitis plan 1 g of Rocephin IM oral antibiotics bed rest at home elevation followup in 24 hours

## 2011-11-13 ENCOUNTER — Encounter: Payer: Self-pay | Admitting: Family Medicine

## 2011-11-13 ENCOUNTER — Ambulatory Visit (INDEPENDENT_AMBULATORY_CARE_PROVIDER_SITE_OTHER): Payer: BC Managed Care – PPO | Admitting: Family Medicine

## 2011-11-13 VITALS — BP 140/90 | Temp 97.9°F | Wt 231.0 lb

## 2011-11-13 DIAGNOSIS — IMO0002 Reserved for concepts with insufficient information to code with codable children: Secondary | ICD-10-CM

## 2011-11-13 DIAGNOSIS — L03119 Cellulitis of unspecified part of limb: Secondary | ICD-10-CM

## 2011-11-13 NOTE — Patient Instructions (Signed)
I have made you an appointment to see Dr. Norlene Campbell orthopedist tomorrow at [phone]

## 2011-11-13 NOTE — Progress Notes (Signed)
  Subjective:    Patient ID: Eduardo Turner, male    DOB: 07-Jun-1961, 51 y.o.   MRN: [id number]  HPI Clavin is a 51 year old male nonsmoker who comes in today for followup of a infected bursa right elbow  We saw him 2 days ago there was an area of erythema about 2 inches. Minimal swelling. We gave him a gram of Rocephin IM and start him on antibiotics doxycycline and Septra 1 of each twice a day he comes back today and is no better. The erythema has increased it's now about 4 inches in diameter   Review of Systems Gen. an orthopedic review of systems otherwise negative    Objective:   Physical Exam  Well-developed well-nourished male in no acute distress examination of right arm shows a 4 inch area of erythema now there is swelling      Assessment & Plan:  Cellulitis right elbow question infected bursa plan consult with Dr. Norlene Campbell tomorrow at [phone]

## 2012-04-16 ENCOUNTER — Other Ambulatory Visit: Payer: Self-pay | Admitting: *Deleted

## 2012-04-16 DIAGNOSIS — I10 Essential (primary) hypertension: Secondary | ICD-10-CM

## 2012-04-16 DIAGNOSIS — F329 Major depressive disorder, single episode, unspecified: Secondary | ICD-10-CM

## 2012-04-16 DIAGNOSIS — F3289 Other specified depressive episodes: Secondary | ICD-10-CM

## 2012-04-16 MED ORDER — LISINOPRIL 20 MG PO TABS: 20.0000 mg | ORAL_TABLET | Freq: Every day | ORAL | Status: DC

## 2012-04-16 MED ORDER — CITALOPRAM HYDROBROMIDE 20 MG PO TABS: 20.0000 mg | ORAL_TABLET | Freq: Every day | ORAL | Status: DC

## 2012-12-10 ENCOUNTER — Encounter: Payer: Self-pay | Admitting: Family Medicine

## 2012-12-10 ENCOUNTER — Ambulatory Visit (INDEPENDENT_AMBULATORY_CARE_PROVIDER_SITE_OTHER): Payer: BC Managed Care – PPO | Admitting: Family Medicine

## 2012-12-10 VITALS — BP 120/80 | Temp 98.3°F | Wt 231.0 lb

## 2012-12-10 DIAGNOSIS — IMO0002 Reserved for concepts with insufficient information to code with codable children: Secondary | ICD-10-CM

## 2012-12-10 DIAGNOSIS — S86911A Strain of unspecified muscle(s) and tendon(s) at lower leg level, right leg, initial encounter: Secondary | ICD-10-CM

## 2012-12-10 NOTE — Progress Notes (Signed)
Chief Complaint  Patient presents with  . calf pain    kind of feels like a cramp     HPI:  Acute visit for calf pain: -R leg cramp - started last night while playing ball - while running felt a strain in muscle and has had some pain since -pain is cramping pain, constant, 5/10 pain -worse with walking -better with ice and ibuprofen -denies: snapping sound, weakness, unable to bear weight, swelling or redness, numbness, fevers  ROS: See pertinent positives and negatives per HPI.  Past Medical History  Diagnosis Date  . Allergy   . Asthma   . Hypertension   . Multiple lipomas   . Dysplastic nevi   . Peptic esophageal ulcer   . GERD (gastroesophageal reflux disease)     Family History  Problem Relation Age of Onset  . Bipolar disorder Sister     History   Social History  . Marital Status: Married    Spouse Name: N/A    Number of Children: N/A  . Years of Education: N/A   Social History Main Topics  . Smoking status: Never Smoker   . Smokeless tobacco: Never Used  . Alcohol Use: 0.6 oz/week    1 Cans of beer per week  . Drug Use: No  . Sexually Active: None   Other Topics Concern  . None   Social History Narrative  . None    Current outpatient prescriptions:citalopram (CELEXA) 20 MG tablet, Take 1 tablet (20 mg total) by mouth daily., Disp: 100 tablet, Rfl: 3;  ibuprofen (ADVIL,MOTRIN) 200 MG tablet, Take 200 mg by mouth as needed.  , Disp: , Rfl: ;  lisinopril (PRINIVIL,ZESTRIL) 20 MG tablet, Take 1 tablet (20 mg total) by mouth daily., Disp: 100 tablet, Rfl: 3;  loratadine (CLARITIN) 10 MG tablet, Take 10 mg by mouth daily.  , Disp: , Rfl:  omeprazole (PRILOSEC) 40 MG capsule, Take 40 mg by mouth daily.  , Disp: , Rfl:   EXAM:  Filed Vitals:   12/10/12 1258  BP: 120/80  Temp: 98.3 F (36.8 C)    Body mass index is 33.64 kg/(m^2).  GENERAL: vitals reviewed and listed above, alert, oriented, appears well hydrated and in no acute distress  MS: moves  all extremities without noticeable abnormality Normal gait Normal appearance both LEs - no swelling, erythema, rash, deformity TTP medial gastroc muscle belly Otherwise normal exam of LE and knees, NV intact, normal muscle strength   PSYCH: pleasant and cooperative, no obvious depression or anxiety  ASSESSMENT AND PLAN:  Discussed the following assessment and plan:  Muscle strain of lower leg, right, initial encounter  -advised per below -HEP provided, return precautions discussed -follow up 4 weeks -Patient advised to return or notify a doctor sooner if symptoms worsen or persist or new concerns arise.  There are no Patient Instructions on file for this visit.   Kriste Basque R.

## 2012-12-10 NOTE — Patient Instructions (Signed)
-  heat for 15 minutes twice daily  -stay active with gentle activity and stretching - no strenuous activity for 1 week  -ibuprofen 400-600mg  twice daily or tylenol 500-1000mg  up to 3 times daily as needed for pain  -topical sports creams such as capsaicin or menthol are also helpful  -exercises provided  -follow up in 4 weeks if needed

## 2013-01-10 ENCOUNTER — Ambulatory Visit: Payer: BC Managed Care – PPO | Admitting: Family Medicine

## 2013-06-14 ENCOUNTER — Other Ambulatory Visit: Payer: Self-pay | Admitting: Family Medicine

## 2013-07-20 ENCOUNTER — Telehealth: Payer: Self-pay | Admitting: Family Medicine

## 2013-07-20 NOTE — Telephone Encounter (Signed)
Per Cornell Barman, RN  Patient Information: Caller Name: Eduardo Turner Phone: [phone] Patient: Eduardo Turner, Eduardo Turner Gender: Male DOB: May 30, 1961 Age: 52 Years PCP: Kelle Darting [facility]  Office Follow Up: Does the office need to follow up with this patient?: No Instructions For The Office: N/A  RN Note: contacted office and spoke with [place] . Appt scheduled for 07/21/13 with cody Daphine Deutscher PA  at [facility] at 09:30 a.m.  Care advice and call back parameters reviewed. Understanding expressed.  Symptoms Reason For Call & Symptoms: Patient states onset of illness Sunday night 07/17/13.  Vomiting on Sunday that stopped.  +headache, +cough non productive  and  Slight runny nose,  +sneezing . He has pain in rib cage with coughing. Feels Hot no thermometer x2 days Reviewed Health History In EMR: Yes Reviewed Medications In EMR: Yes Reviewed Allergies In EMR: Yes Reviewed Surgeries / Procedures: Yes Date of Onset of Symptoms: 07/17/2013 Treatments Tried: Advil and clairten Treatments Tried Worked: No Any Fever: Yes Fever Taken: Tactile Fever Time Of Reading: 14:37:00 Fever Last Reading: N/A  Guideline(s) Used: Cough  Disposition Per Guideline:   See Today or Tomorrow in Office  Reason For Disposition Reached:   Patient wants to be seen  Advice Given: Reassurance Coughing is the way that our lungs remove irritants and mucus. It helps protect our lungs from getting pneumonia. You can get a dry hacking cough after a chest cold. Sometimes this type of cough can last 1-3 weeks, and be worse at night. Cough Medicines: OTC Cough Syrups: The most common cough suppressant in OTC cough medications is dextromethorphan. Often the letters "DM" appear in the name. OTC Cough Drops: Cough drops can help a lot, especially for mild coughs. They reduce coughing by soothing your irritated throat and removing that tickle sensation in the back of the throat. Cough drops also have the  advantage of portability - you can carry them with you. Home Remedy - Hard Candy: Hard candy works just as well as medicine-flavored OTC cough drops. Diabetics should use sugar-free candy. Home Remedy - Honey: This old home remedy has been shown to help decrease coughing at night. The adult dosage is 2 teaspoons (10 ml) at bedtime. Honey should not be given to infants under one year of age. OTC Cough Syrup - Dextromethorphan: Cough syrups containing the cough suppressant dextromethorphan (DM) may help decrease your cough. Cough syrups work best for coughs that keep you awake at night. They can also sometimes help in the late stages of a respiratory infection when the cough is dry and hacking. They can be used along with cough drops. Examples: Benylin, Robitussin DM, Vicks 44 Cough Relief Read the package instructions for dosage, contraindications, and other important information. Coughing Spasms: Drink warm fluids. Inhale warm mist (Reason: both relax the airway and loosen up the phlegm). Suck on cough drops or hard candy to coat the irritated throat. Prevent Dehydration: Drink adequate liquids. This will help soothe an irritated or dry throat and loosen up the phlegm. Fever Medicines: For fevers above 101 F (38.3 C) take either acetaminophen or ibuprofen. They are over-the-counter (OTC) drugs that help treat both fever and pain. You can buy them at the drugstore.l Acetaminophen (e.g., Tylenol): Regular Strength Tylenol: Take 650 mg (two 325 mg pills) by mouth every 4-6 hours as needed. Each Regular Strength Tylenol pill has 325 mg of acetaminophen. Extra Strength Tylenol: Take 1,000 mg (two 500 mg pills) every 8 hours as needed. Each Extra Strength  Tylenol pill has 500 mg of acetaminophen. Ibuprofen (e.g., Motrin, Advil): Another choice is to take 600 mg (three 200 mg pills) by mouth every 8 hours. Call Back If: Difficulty breathing Cough lasts more than 3 weeks Fever lasts > 3 days You  become worse.  Patient Will Follow Care Advice: YES

## 2013-07-21 ENCOUNTER — Encounter: Payer: Self-pay | Admitting: Physician Assistant

## 2013-07-21 ENCOUNTER — Ambulatory Visit (INDEPENDENT_AMBULATORY_CARE_PROVIDER_SITE_OTHER): Payer: BC Managed Care – PPO | Admitting: Physician Assistant

## 2013-07-21 VITALS — BP 132/102 | HR 71 | Temp 98.1°F | Ht 69.5 in | Wt 232.0 lb

## 2013-07-21 DIAGNOSIS — J069 Acute upper respiratory infection, unspecified: Secondary | ICD-10-CM

## 2013-07-21 NOTE — Patient Instructions (Signed)
Please increase fluid intake.  Rest.  Saline nasal spray. Take a daily probiotic.  Place a humidifier in the bedroom.  Continue Advil for tenderness with coughing.  Albuterol refilled.  Please call if symptoms do not continue to improve.  Upper Respiratory Infection, Adult An upper respiratory infection (URI) is also known as the common cold. It is often caused by a type of germ (virus). Colds are easily spread (contagious). You can pass it to others by kissing, coughing, sneezing, or drinking out of the same glass. Usually, you get better in 1 or 2 weeks.  HOME CARE   Only take medicine as told by your doctor.  Use a warm mist humidifier or breathe in steam from a hot shower.  Drink enough water and fluids to keep your pee (urine) clear or pale yellow.  Get plenty of rest.  Return to work when your temperature is back to normal or as told by your doctor. You may use a face mask and wash your hands to stop your cold from spreading. GET HELP RIGHT AWAY IF:   After the first few days, you feel you are getting worse.  You have questions about your medicine.  You have chills, shortness of breath, or brown or red spit (mucus).  You have yellow or brown snot (nasal discharge) or pain in the face, especially when you bend forward.  You have a fever, puffy (swollen) neck, pain when you swallow, or white spots in the back of your throat.  You have a bad headache, ear pain, sinus pain, or chest pain.  You have a high-pitched whistling sound when you breathe in and out (wheezing).  You have a lasting cough or cough up blood.  You have sore muscles or a stiff neck. MAKE SURE YOU:   Understand these instructions.  Will watch your condition.  Will get help right away if you are not doing well or get worse. Document Released: 01/14/2008 Document Revised: 10/20/2011 Document Reviewed: 12/02/2010 [facility] Patient Information 2014 [place], [place].

## 2013-07-21 NOTE — Progress Notes (Signed)
Pre visit review using our clinic review tool, if applicable. No additional management support is needed unless otherwise documented below in the visit note. 

## 2013-07-23 NOTE — Assessment & Plan Note (Signed)
Physical examination unremarkable. Good movement of air throughout lungs. Giving changing symptoms, most likely viral in nature. Increase fluid intake. Rest. Probiotic. Humidifier in bedroom. Saline nasal spray. Delsym for cough. Continue with Claritin daily. Albuterol inhaler as needed. Call or return to clinic if symptoms do not continue to improve.

## 2013-07-23 NOTE — Progress Notes (Signed)
Patient ID: YAW ESCOTO, male   DOB: 1960/11/12, 52 y.o.   MRN: [id number]  Patient presents to clinic today complaining of nonproductive cough and nasal congestion x5 days. Patient states symptoms began gradually. At first, patient experienced postnasal drip, scratchy throat head congestion, that has since resolved. The patient endorses nasal congestion and persistent cough. Denies sputum production. Denies fever, chills, malaise and fatigue. Does have history of asthma and allergy. Denies shortness of breath or wheezing.  Patient with history of high blood pressure. BP elevated at 132/102 in clinic today. Patient states has not taken his medicine. Denies chest pain, palpitations, vision changes, lightheadedness or dizziness.   Past Medical History  Diagnosis Date  . Allergy   . Asthma   . Hypertension   . Multiple lipomas   . Dysplastic nevi   . Peptic esophageal ulcer   . GERD (gastroesophageal reflux disease)     Current Outpatient Prescriptions on File Prior to Visit  Medication Sig Dispense Refill  . citalopram (CELEXA) 20 MG tablet Take 1 tablet (20 mg total) by mouth daily.  100 tablet  3  . ibuprofen (ADVIL,MOTRIN) 200 MG tablet Take 200 mg by mouth as needed.        [name] lisinopril (PRINIVIL,ZESTRIL) 20 MG tablet TAKE 1 TABLET BY MOUTH DAILY  100 tablet  0  . loratadine (CLARITIN) 10 MG tablet Take 10 mg by mouth daily.        [name] omeprazole (PRILOSEC) 40 MG capsule Take 40 mg by mouth daily.         No current facility-administered medications on file prior to visit.    Allergies  Allergen Reactions  . Erythromycin Ethylsuccinate     REACTION: HIVES    Family History  Problem Relation Age of Onset  . Bipolar disorder Sister     History   Social History  . Marital Status: Married    Spouse Name: N/A    Number of Children: N/A  . Years of Education: N/A   Social History Main Topics  . Smoking status: Never Smoker   . Smokeless tobacco: Never Used  . Alcohol  Use: 0.6 oz/week    1 Cans of beer per week  . Drug Use: No  . Sexual Activity: None   Other Topics Concern  . None   Social History Narrative  . None   Review of Systems - see history of present illness. All other review of systems are negative.  Filed Vitals:   07/21/13 0925  BP: 132/102  Pulse: 71  Temp: 98.1 F (36.7 C)   Physical Exam  Vitals reviewed. Constitutional: He is oriented to person, place, and time and well-developed, well-nourished, and in no distress.  HENT:  Head: Normocephalic and atraumatic.  Right Ear: External ear normal.  Left Ear: External ear normal.  Nose: Nose normal.  Mouth/Throat: Oropharynx is clear and moist. No oropharyngeal exudate.  Tympanic membranes within normal limits bilaterally. No tenderness to percussion of sinuses noted on examination.  Eyes: Conjunctivae are normal. Pupils are equal, round, and reactive to light.  Neck: Neck supple.  Cardiovascular: Normal rate, regular rhythm, normal heart sounds and intact distal pulses.   Pulmonary/Chest: Effort normal and breath sounds normal. No respiratory distress. He has no wheezes. He has no rales. He exhibits no tenderness.  Lymphadenopathy:    He has no cervical adenopathy.  Neurological: He is alert and oriented to person, place, and time.  Skin: Skin is warm and dry. No rash noted.  Psychiatric: Affect normal.   Assessment/Plan: VIRAL URI Physical examination unremarkable. Good movement of air throughout lungs. Giving changing symptoms, most likely viral in nature. Increase fluid intake. Rest. Probiotic. Humidifier in bedroom. Saline nasal spray. Delsym for cough. Continue with Claritin daily. Albuterol inhaler as needed. Call or return to clinic if symptoms do not continue to improve.

## 2014-06-30 ENCOUNTER — Encounter: Payer: Self-pay | Admitting: Family Medicine

## 2014-06-30 ENCOUNTER — Ambulatory Visit (INDEPENDENT_AMBULATORY_CARE_PROVIDER_SITE_OTHER): Payer: BC Managed Care – PPO | Admitting: Family Medicine

## 2014-06-30 VITALS — BP 112/74 | HR 83 | Temp 98.2°F | Ht 69.5 in | Wt 223.0 lb

## 2014-06-30 DIAGNOSIS — K120 Recurrent oral aphthae: Secondary | ICD-10-CM

## 2014-06-30 MED ORDER — PREDNISONE 10 MG PO TABS: ORAL_TABLET | ORAL | Status: DC

## 2014-06-30 NOTE — Progress Notes (Signed)
Pre visit review using our clinic review tool, if applicable. No additional management support is needed unless otherwise documented below in the visit note. 

## 2014-06-30 NOTE — Progress Notes (Signed)
   Subjective:    Patient ID: Eduardo Turner, male    DOB: [birth year], 53 y.o.   MRN: [id number]  HPI Here for 5 days of painful ulcers in the mouth. He has never had these before. No recent illnesses but he admits to a lot of stress in his life.    Review of Systems  Constitutional: Negative.   HENT: Positive for mouth sores.   Eyes: Negative.   Respiratory: Negative.        Objective:   Physical Exam  Constitutional: He appears well-developed and well-nourished.  HENT:  Right Ear: External ear normal.  Left Ear: External ear normal.  Nose: Nose normal.  There are multiple small ulcers in the posterior OP, on the cheeks, and on the lower lip  Eyes: Conjunctivae are normal.  Pulmonary/Chest: Effort normal and breath sounds normal.  Lymphadenopathy:    He has no cervical adenopathy.          Assessment & Plan:  Given a prednisone taper.

## 2014-08-22 ENCOUNTER — Other Ambulatory Visit: Payer: Self-pay | Admitting: Family Medicine

## 2014-09-04 ENCOUNTER — Telehealth: Payer: Self-pay | Admitting: Family Medicine

## 2014-09-04 MED ORDER — LISINOPRIL 20 MG PO TABS: 20.0000 mg | ORAL_TABLET | Freq: Every day | ORAL | Status: DC

## 2014-09-04 NOTE — Telephone Encounter (Signed)
Refill sent.

## 2014-09-04 NOTE — Telephone Encounter (Signed)
Pt request refill lisinopril (PRINIVIL,ZESTRIL) 20 MG tablet Pt has made cpe appt, however is out of this med and needs asap. [facility]/ [place] 30 day w/ one refill to get pt to appt

## 2014-09-04 NOTE — Addendum Note (Signed)
Addended by: Westley Hummer B on: 09/04/2014 01:30 PM   Modules accepted: Orders

## 2014-10-10 ENCOUNTER — Other Ambulatory Visit (INDEPENDENT_AMBULATORY_CARE_PROVIDER_SITE_OTHER): Payer: BLUE CROSS/BLUE SHIELD

## 2014-10-10 DIAGNOSIS — Z0000 Encounter for general adult medical examination without abnormal findings: Secondary | ICD-10-CM

## 2014-10-10 LAB — HEPATIC FUNCTION PANEL
ALBUMIN: 4.5 g/dL (ref 3.5–5.2)
ALT: 31 U/L (ref 0–53)
AST: 24 U/L (ref 0–37)
Alkaline Phosphatase: 64 U/L (ref 39–117)
BILIRUBIN DIRECT: 0.2 mg/dL (ref 0.0–0.3)
TOTAL PROTEIN: 6.9 g/dL (ref 6.0–8.3)
Total Bilirubin: 1.9 mg/dL — ABNORMAL HIGH (ref 0.2–1.2)

## 2014-10-10 LAB — POCT URINALYSIS DIPSTICK
Blood, UA: NEGATIVE
GLUCOSE UA: NEGATIVE
Leukocytes, UA: NEGATIVE
Nitrite, UA: NEGATIVE
Protein, UA: NEGATIVE
SPEC GRAV UA: 1.015
Urobilinogen, UA: 2
pH, UA: 5.5

## 2014-10-10 LAB — BASIC METABOLIC PANEL
BUN: 17 mg/dL (ref 6–23)
CALCIUM: 9.3 mg/dL (ref 8.4–10.5)
CO2: 33 mEq/L — ABNORMAL HIGH (ref 19–32)
CREATININE: 0.9 mg/dL (ref 0.40–1.50)
Chloride: 102 mEq/L (ref 96–112)
GFR: 94.1 mL/min (ref >60.00)
GLUCOSE: 94 mg/dL (ref 70–99)
Potassium: 4.4 mEq/L (ref 3.5–5.1)
SODIUM: 139 meq/L (ref 135–145)

## 2014-10-10 LAB — LIPID PANEL
CHOL/HDL RATIO: 6
CHOLESTEROL: 201 mg/dL — AB (ref 0–200)
HDL: 36.2 mg/dL — ABNORMAL LOW (ref >39.00)
LDL CALC: 132 mg/dL — AB (ref 0–99)
NonHDL: 164.8
Triglycerides: 162 mg/dL — ABNORMAL HIGH (ref 0.0–149.0)
VLDL: 32.4 mg/dL (ref 0.0–40.0)

## 2014-10-10 LAB — CBC WITH DIFFERENTIAL/PLATELET
Basophils Absolute: 0 10*3/uL (ref 0.0–0.1)
Basophils Relative: 0.6 % (ref 0.0–3.0)
EOS PCT: 3.9 % (ref 0.0–5.0)
Eosinophils Absolute: 0.2 10*3/uL (ref 0.0–0.7)
HEMATOCRIT: 46.7 % (ref 39.0–52.0)
Hemoglobin: 15.9 g/dL (ref 13.0–17.0)
LYMPHS PCT: 30.6 % (ref 12.0–46.0)
Lymphs Abs: 1.6 10*3/uL (ref 0.7–4.0)
MCHC: 34.1 g/dL (ref 30.0–36.0)
MCV: 85 fl (ref 78.0–100.0)
MONOS PCT: 7.5 % (ref 3.0–12.0)
Monocytes Absolute: 0.4 10*3/uL (ref 0.1–1.0)
NEUTROS PCT: 57.4 % (ref 43.0–77.0)
Neutro Abs: 3 10*3/uL (ref 1.4–7.7)
PLATELETS: 220 10*3/uL (ref 150.0–400.0)
RBC: 5.49 Mil/uL (ref 4.22–5.81)
RDW: 13.3 % (ref 11.5–15.5)
WBC: 5.3 10*3/uL (ref 4.0–10.5)

## 2014-10-10 LAB — TSH: TSH: 1.88 u[IU]/mL (ref 0.35–4.50)

## 2014-10-10 LAB — PSA: PSA: 0.49 ng/mL (ref 0.10–4.00)

## 2014-10-12 ENCOUNTER — Telehealth: Payer: Self-pay | Admitting: Family Medicine

## 2014-10-12 NOTE — Telephone Encounter (Signed)
[facility] - Client [place] [facility]  Patient Name: Eduardo Turner  DOB: Mar 02, 1961    Initial Comment Caller States he is lightheaded and hot feeling.    Nurse Assessment  Nurse: Justine Null, RN, Rodena Piety Date/Time (Eastern Time): 10/12/2014 3:24:42 PM  Confirm and document reason for call. If symptomatic, describe symptoms. ---Caller States he is lightheaded and hot feeling. caller stated that he has been having this for the past 15 minutes ago and he was standing and he felt hot and then he was faint and has still has some lightheaded ness at present  Has the patient traveled out of the country within the last 30 days? ---No  Does the patient require triage? ---Yes  Related visit to physician within the last 2 weeks? ---Yes  Does the PT have any chronic conditions? (i.e. diabetes, asthma, etc.) ---Yes  List chronic conditions. ---asthma HTN     Guidelines    Guideline Title Affirmed Question Affirmed Notes  Dizziness - Lightheadedness SEVERE dizziness (e.g., unable to stand, requires support to walk, feels like passing out now)    Final Disposition User   Go to ED Now (or PCP triage) Justine Null, RN, Rodena Piety

## 2014-10-12 NOTE — Telephone Encounter (Signed)
Followed up with pt who is currently at [facility] ED.  Pt states he is scheduled to see Dr. Sherren Mocha on Monday, 10/16/14, pt advised to keep appt with PCP.

## 2014-10-13 ENCOUNTER — Encounter: Payer: Self-pay | Admitting: Family Medicine

## 2014-10-13 NOTE — Telephone Encounter (Signed)
noted 

## 2014-10-16 ENCOUNTER — Ambulatory Visit (INDEPENDENT_AMBULATORY_CARE_PROVIDER_SITE_OTHER): Payer: BLUE CROSS/BLUE SHIELD | Admitting: Family Medicine

## 2014-10-16 ENCOUNTER — Encounter: Payer: Self-pay | Admitting: Family Medicine

## 2014-10-16 VITALS — BP 152/98 | HR 85 | Temp 98.3°F | Ht 69.0 in | Wt 226.0 lb

## 2014-10-16 DIAGNOSIS — Z23 Encounter for immunization: Secondary | ICD-10-CM

## 2014-10-16 DIAGNOSIS — Z0000 Encounter for general adult medical examination without abnormal findings: Secondary | ICD-10-CM

## 2014-10-16 DIAGNOSIS — K219 Gastro-esophageal reflux disease without esophagitis: Secondary | ICD-10-CM

## 2014-10-16 DIAGNOSIS — I10 Essential (primary) hypertension: Secondary | ICD-10-CM

## 2014-10-16 DIAGNOSIS — F32A Depression, unspecified: Secondary | ICD-10-CM

## 2014-10-16 DIAGNOSIS — F329 Major depressive disorder, single episode, unspecified: Secondary | ICD-10-CM

## 2014-10-16 MED ORDER — OMEPRAZOLE 40 MG PO CPDR: 40.0000 mg | DELAYED_RELEASE_CAPSULE | Freq: Every day | ORAL | Status: DC

## 2014-10-16 MED ORDER — LISINOPRIL 40 MG PO TABS: 40.0000 mg | ORAL_TABLET | Freq: Every day | ORAL | Status: DC

## 2014-10-16 NOTE — Progress Notes (Signed)
Pre visit review using our clinic review tool, if applicable. No additional management support is needed unless otherwise documented below in the visit note. 

## 2014-10-16 NOTE — Progress Notes (Signed)
   Subjective:    Patient ID: Eduardo Turner, male    DOB: April 30, 1961, 54 y.o.   MRN: [id number]  HPI Lopez is a 54 year old married male nonsmoker who comes in today for general physical examination because of a history of mild depression, hypertension, allergic rhinitis, reflux esophagitis  He takes lisinopril 20 mg daily BP not at goal 152/98  He gets routine eye care, dental care, colonoscopy 2011 normal, vaccinations up-to-date except he is due a tetanus booster. Last tetanus booster was 2004. Tetanus booster given today  He's been out of his Celexa for couple months and wishes to restart it.  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   HENT: Negative.   Eyes: Negative.   Respiratory: Negative.   Cardiovascular: Negative.   Gastrointestinal: Negative.   Endocrine: Negative.   Genitourinary: Negative.   Musculoskeletal: Negative.   Skin: Negative.   Allergic/Immunologic: Negative.   Neurological: Negative.   Hematological: Negative.   Psychiatric/Behavioral: Negative.        Objective:   Physical Exam  Constitutional: He is oriented to person, place, and time. He appears well-developed and well-nourished.  HENT:  Head: Normocephalic and atraumatic.  Right Ear: External ear normal.  Left Ear: External ear normal.  Nose: Nose normal.  Mouth/Throat: Oropharynx is clear and moist.  Eyes: Conjunctivae and EOM are normal. Pupils are equal, round, and reactive to light.  Neck: Normal range of motion. Neck supple. No JVD present. No tracheal deviation present. No thyromegaly present.  Cardiovascular: Normal rate, regular rhythm, normal heart sounds and intact distal pulses.  Exam reveals no gallop and no friction rub.   No murmur heard. Pulmonary/Chest: Effort normal and breath sounds normal. No stridor. No respiratory distress. He has no wheezes. He has no rales. He exhibits no tenderness.  Abdominal: Soft. Bowel  sounds are normal. He exhibits no distension and no mass. There is no tenderness. There is no rebound and no guarding.  Genitourinary: Rectum normal, prostate normal and penis normal. Guaiac negative stool. No penile tenderness.  Musculoskeletal: Normal range of motion. He exhibits no edema or tenderness.  Lymphadenopathy:    He has no cervical adenopathy.  Neurological: He is alert and oriented to person, place, and time. He has normal reflexes. No cranial nerve deficit. He exhibits normal muscle tone.  Skin: Skin is warm and dry. No rash noted. No erythema. No pallor.  Total body skin exam normal  Psychiatric: He has a normal mood and affect. His behavior is normal. Judgment and thought content normal.  Nursing note and vitals reviewed.         Assessment & Plan:  Healthy male  Hypertension not at goal,,,,, increase lisinopril to 40 mg daily,,,,,,,, BP check every morning follow-up in 6 weeks  History of mild depression,,,,,, restart Celexa 20 mg daily  Allergic rhinitis continue Claritin  Reflux esophagitis continue omeprazole 40 mg daily,,,

## 2014-10-16 NOTE — Patient Instructions (Signed)
Lisinopril 40 mg,,,,, 1 daily in the morning  Celexa 20 mg,,,,,,, 1 at bedtime  Blood pressure check every morning,,,,,,, an hour after you take your medication,,,,,,,,,,,OMRON,,,,,,, pump up digital blood pressure cuff.... [place]  When you return in April bring a copy of all your data and the device.

## 2014-10-17 ENCOUNTER — Telehealth: Payer: Self-pay | Admitting: Family Medicine

## 2014-10-17 NOTE — Telephone Encounter (Signed)
emmi mailed  °

## 2014-11-23 ENCOUNTER — Ambulatory Visit (INDEPENDENT_AMBULATORY_CARE_PROVIDER_SITE_OTHER): Payer: BLUE CROSS/BLUE SHIELD | Admitting: Family Medicine

## 2014-11-23 ENCOUNTER — Encounter: Payer: Self-pay | Admitting: Family Medicine

## 2014-11-23 VITALS — BP 146/100 | Temp 98.4°F | Wt 231.2 lb

## 2014-11-23 DIAGNOSIS — F32A Depression, unspecified: Secondary | ICD-10-CM

## 2014-11-23 DIAGNOSIS — F329 Major depressive disorder, single episode, unspecified: Secondary | ICD-10-CM

## 2014-11-23 DIAGNOSIS — I10 Essential (primary) hypertension: Secondary | ICD-10-CM

## 2014-11-23 MED ORDER — CITALOPRAM HYDROBROMIDE 40 MG PO TABS: 40.0000 mg | ORAL_TABLET | Freq: Every day | ORAL | Status: DC

## 2014-11-23 MED ORDER — LISINOPRIL 10 MG PO TABS: 10.0000 mg | ORAL_TABLET | Freq: Every day | ORAL | Status: DC

## 2014-11-23 NOTE — Patient Instructions (Signed)
Increase the lisinopril to 50 mg daily  Increase the Celexa to 40 mg daily  Check your blood pressure daily in the morning  Return in 2 weeks for follow-up

## 2014-11-23 NOTE — Progress Notes (Signed)
   Subjective:    Patient ID: Eduardo Turner, male    DOB: 1961-04-23, 54 y.o.   MRN: [id number]  HPI Eduardo Turner is a 54 year old married male nonsmoker who comes in today for evaluation of 2 issues  He has a history of mild depression and there is some family issues and he would like to increase his Celexa.  His blood pressures not at goal on the 40 mg of lisinopril. We increased it from 20-40. BP today is running 146/100. No side effects from medication   Review of Systems Review of systems otherwise negative    Objective:   Physical Exam  Well-developed well-nourished male no acute distress vital signs stable is afebrile BP 146/100      Assessment & Plan:  Hypertension not at goal........ increase lisinopril to 60 mg daily follow-up in 2 weeks  Depression......... increase Celexa 30 mg daily follow-up in 2 weeks.

## 2014-11-23 NOTE — Progress Notes (Signed)
Pre visit review using our clinic review tool, if applicable. No additional management support is needed unless otherwise documented below in the visit note. 

## 2014-12-07 ENCOUNTER — Encounter: Payer: Self-pay | Admitting: Family Medicine

## 2014-12-07 ENCOUNTER — Ambulatory Visit (INDEPENDENT_AMBULATORY_CARE_PROVIDER_SITE_OTHER): Payer: BLUE CROSS/BLUE SHIELD | Admitting: Family Medicine

## 2014-12-07 VITALS — BP 118/80 | Temp 98.2°F | Wt 225.0 lb

## 2014-12-07 DIAGNOSIS — I10 Essential (primary) hypertension: Secondary | ICD-10-CM

## 2014-12-07 NOTE — Progress Notes (Signed)
   Subjective:    Patient ID: Eduardo Turner, male    DOB: 1960-12-13, 54 y.o.   MRN: [id number]  HPI Call is a 54 year old male married nonsmoker who comes in today for reevaluation of hypertension  Her increase his lisinopril to 50 mg daily because his blood pressure was not at goal. Now his blood pressures normal. 118/80   Review of Systems    no side effects from medication Objective:   Physical Exam  Well-developed well-nourished male no acute distress vital signs stable is afebrile BP 118/80 right arm sitting position      Assessment & Plan:  Hypertension at goal,,,,,,,,, continue current therapy,,,,,, monitor blood pressure weekly,,,,,,,, return in March 2017 annual exam sooner if any problem

## 2014-12-07 NOTE — Patient Instructions (Signed)
Continue current medications  Check your blood pressure weekly  Return when necessary  March 2017 second week annual exam........ call in December  Rachel's extension is [place].......[name] our new adult nurse practitioner from [facility]

## 2014-12-07 NOTE — Progress Notes (Signed)
Pre visit review using our clinic review tool, if applicable. No additional management support is needed unless otherwise documented below in the visit note. 

## 2015-02-04 ENCOUNTER — Other Ambulatory Visit: Payer: Self-pay | Admitting: Family Medicine

## 2015-04-24 ENCOUNTER — Telehealth: Payer: Self-pay | Admitting: Family Medicine

## 2015-04-24 NOTE — Telephone Encounter (Signed)
Pt wants Rx for lisinopril 20 mg and lisinopril 40 mg sent to mail order. [facility] [phone]

## 2015-04-25 MED ORDER — LISINOPRIL 20 MG PO TABS: 20.0000 mg | ORAL_TABLET | Freq: Every day | ORAL | Status: DC

## 2015-04-25 MED ORDER — LISINOPRIL 40 MG PO TABS: 40.0000 mg | ORAL_TABLET | Freq: Every day | ORAL | Status: DC

## 2016-01-14 ENCOUNTER — Other Ambulatory Visit: Payer: Self-pay | Admitting: Family Medicine

## 2016-01-14 MED ORDER — LISINOPRIL 20 MG PO TABS: 20.0000 mg | ORAL_TABLET | Freq: Every day | ORAL | Status: DC

## 2016-01-14 MED ORDER — LISINOPRIL 40 MG PO TABS: 40.0000 mg | ORAL_TABLET | Freq: Every day | ORAL | Status: DC

## 2016-01-14 NOTE — Telephone Encounter (Signed)
Spoke to pt, told him Rx's sent to [facility] as requested but needs to schedule physical you are overdue. Pt verbalized understanding and will call back and schedule.

## 2016-01-14 NOTE — Telephone Encounter (Signed)
Pt needs refills on lisinopril 20 mg and 40 mg #90 each w/refills [facility]

## 2016-04-21 ENCOUNTER — Telehealth: Payer: Self-pay | Admitting: Family Medicine

## 2016-04-21 ENCOUNTER — Other Ambulatory Visit: Payer: Self-pay

## 2016-04-21 MED ORDER — LISINOPRIL 20 MG PO TABS: 20.0000 mg | ORAL_TABLET | Freq: Every day | ORAL | 0 refills | Status: DC

## 2016-04-21 NOTE — Telephone Encounter (Signed)
Pt need new Rx for lisinopril 20 and  40 mg     Pharm:  [facility]

## 2016-04-21 NOTE — Telephone Encounter (Signed)
Prescription sent to pharmacy as requested.

## 2016-07-21 ENCOUNTER — Other Ambulatory Visit: Payer: Self-pay | Admitting: Family Medicine

## 2016-07-21 ENCOUNTER — Ambulatory Visit (INDEPENDENT_AMBULATORY_CARE_PROVIDER_SITE_OTHER): Payer: BLUE CROSS/BLUE SHIELD | Admitting: Family Medicine

## 2016-07-21 ENCOUNTER — Encounter: Payer: Self-pay | Admitting: Family Medicine

## 2016-07-21 VITALS — BP 116/78 | HR 73 | Temp 98.6°F | Ht 69.75 in | Wt 231.4 lb

## 2016-07-21 DIAGNOSIS — J4520 Mild intermittent asthma, uncomplicated: Secondary | ICD-10-CM

## 2016-07-21 DIAGNOSIS — Z0000 Encounter for general adult medical examination without abnormal findings: Secondary | ICD-10-CM | POA: Diagnosis not present

## 2016-07-21 DIAGNOSIS — F325 Major depressive disorder, single episode, in full remission: Secondary | ICD-10-CM

## 2016-07-21 DIAGNOSIS — Z114 Encounter for screening for human immunodeficiency virus [HIV]: Secondary | ICD-10-CM

## 2016-07-21 DIAGNOSIS — K219 Gastro-esophageal reflux disease without esophagitis: Secondary | ICD-10-CM

## 2016-07-21 DIAGNOSIS — Z20828 Contact with and (suspected) exposure to other viral communicable diseases: Secondary | ICD-10-CM

## 2016-07-21 DIAGNOSIS — I10 Essential (primary) hypertension: Secondary | ICD-10-CM | POA: Diagnosis not present

## 2016-07-21 DIAGNOSIS — Z1159 Encounter for screening for other viral diseases: Secondary | ICD-10-CM

## 2016-07-21 DIAGNOSIS — E785 Hyperlipidemia, unspecified: Secondary | ICD-10-CM | POA: Insufficient documentation

## 2016-07-21 LAB — LIPID PANEL
CHOLESTEROL: 261 mg/dL — AB (ref 0–200)
HDL: 42.5 mg/dL (ref >39.00)
LDL CALC: 178 mg/dL — AB (ref 0–99)
NONHDL: 218.48
Total CHOL/HDL Ratio: 6
Triglycerides: 200 mg/dL — ABNORMAL HIGH (ref 0.0–149.0)
VLDL: 40 mg/dL (ref 0.0–40.0)

## 2016-07-21 LAB — CBC
HEMATOCRIT: 48.9 % (ref 39.0–52.0)
Hemoglobin: 16.6 g/dL (ref 13.0–17.0)
MCHC: 34 g/dL (ref 30.0–36.0)
MCV: 85.5 fl (ref 78.0–100.0)
Platelets: 244 10*3/uL (ref 150.0–400.0)
RBC: 5.72 Mil/uL (ref 4.22–5.81)
RDW: 13.3 % (ref 11.5–15.5)
WBC: 7.9 10*3/uL (ref 4.0–10.5)

## 2016-07-21 LAB — PSA: PSA: 0.46 ng/mL (ref 0.10–4.00)

## 2016-07-21 LAB — COMPREHENSIVE METABOLIC PANEL
ALT: 35 U/L (ref 0–53)
AST: 26 U/L (ref 0–37)
Albumin: 4.8 g/dL (ref 3.5–5.2)
Alkaline Phosphatase: 52 U/L (ref 39–117)
BUN: 19 mg/dL (ref 6–23)
CHLORIDE: 99 meq/L (ref 96–112)
CO2: 31 mEq/L (ref 19–32)
Calcium: 9.6 mg/dL (ref 8.4–10.5)
Creatinine, Ser: 0.79 mg/dL (ref 0.40–1.50)
GFR: 108.64 mL/min (ref >60.00)
GLUCOSE: 82 mg/dL (ref 70–99)
POTASSIUM: 4.2 meq/L (ref 3.5–5.1)
SODIUM: 138 meq/L (ref 135–145)
Total Bilirubin: 1.5 mg/dL — ABNORMAL HIGH (ref 0.2–1.2)
Total Protein: 7.4 g/dL (ref 6.0–8.3)

## 2016-07-21 MED ORDER — ALBUTEROL SULFATE HFA 108 (90 BASE) MCG/ACT IN AERS: 2.0000 | INHALATION_SPRAY | Freq: Four times a day (QID) | RESPIRATORY_TRACT | 0 refills | Status: DC | PRN

## 2016-07-21 NOTE — Assessment & Plan Note (Signed)
S: controlled on lisinopril 60 mg. Patient had been uncontrolled at 40mg  and was increased above typical dose BP Readings from Last 3 Encounters:  07/21/16 116/78  12/07/14 118/80  11/23/14 (!) 146/100  A/P:Continue current meds:  Doing well despite atypical regimen

## 2016-07-21 NOTE — Assessment & Plan Note (Signed)
S: depression controlled on celexa 20mg  dose A/P: PHQ2 of 0 - continue current medicines

## 2016-07-21 NOTE — Progress Notes (Signed)
Phone: [phone]  Subjective:  Patient presents today for their annual physical. Chief complaint-noted.   See problem oriented charting- ROS- full  review of systems was completed and negative except for: some reflux symptoms if misses dose of omeprazole  The following were reviewed and entered/updated in epic: Past Medical History:  Diagnosis Date  . Allergy   . Asthma   . Dysplastic nevi   . GERD (gastroesophageal reflux disease)   . Hypertension   . Multiple lipomas   . Peptic esophageal ulcer    Patient Active Problem List   Diagnosis Date Noted  . Hyperlipidemia 07/21/2016    Priority: Medium  . GERD 09/19/2009    Priority: Medium  . Depression 06/21/2009    Priority: Medium  . Essential hypertension 02/22/2007    Priority: Medium  . Asthma 02/22/2007    Priority: Medium  . Ophthalmic migraine 09/20/2010    Priority: Low  . DYSPLASTIC NEVUS, BACK 02/05/2008    Priority: Low  . Lipoma 01/31/2008    Priority: Low  . ERECTILE DYSFUNCTION, MILD 03/17/2007    Priority: Low  . Allergic rhinitis 02/22/2007    Priority: Low   Past Surgical History:  Procedure Laterality Date  . SHOULDER SURGERY     bilateral    Family History  Problem Relation Age of Onset  . Bipolar disorder Sister     Medications- reviewed and updated Current Outpatient Prescriptions  Medication Sig Dispense Refill  . citalopram (CELEXA) 20 MG tablet Take 1 tablet (20 mg total) by mouth daily. 100 tablet 3  . ibuprofen (ADVIL,MOTRIN) 200 MG tablet Take 200 mg by mouth as needed.      [name] lisinopril (PRINIVIL,ZESTRIL) 20 MG tablet Take 1 tablet (20 mg total) by mouth daily. 90 tablet 0  . lisinopril (PRINIVIL,ZESTRIL) 40 MG tablet Take 1 tablet (40 mg total) by mouth daily. 90 tablet 0  . loratadine (CLARITIN) 10 MG tablet Take 10 mg by mouth daily.      [name] omeprazole (PRILOSEC) 40 MG capsule Take 1 capsule (40 mg total) by mouth daily. 100 capsule 3  . albuterol (PROVENTIL HFA;VENTOLIN  HFA) 108 (90 Base) MCG/ACT inhaler Inhale 2 puffs into the lungs every 6 (six) hours as needed for wheezing or shortness of breath. 1 Inhaler 0   No current facility-administered medications for this visit.     Allergies-reviewed and updated Allergies  Allergen Reactions  . Erythromycin Ethylsuccinate     REACTION: HIVES    Social History   Social History  . Marital status: Married    Spouse name: N/A  . Number of children: N/A  . Years of education: N/A   Social History Main Topics  . Smoking status: Never Smoker  . Smokeless tobacco: Never Used  . Alcohol use 0.0 oz/week     Comment: occ  . Drug use: No  . Sexual activity: Not Asked   Other Topics Concern  . None   Social History Narrative  . None    Objective: BP 116/78 (BP Location: Left Arm, Patient Position: Sitting, Cuff Size: Large)   Pulse 73   Temp 98.6 F (37 C) (Oral)   Ht 5' 9.75" (1.772 m)   Wt 231 lb 6.4 oz (105 kg)   SpO2 93%   BMI 33.44 kg/m  Gen: NAD, resting comfortably HEENT: Mucous membranes are moist. Oropharynx normal Neck: no thyromegaly CV: RRR no murmurs rubs or gallops Lungs: CTAB no crackles, wheeze, rhonchi Abdomen: soft/nontender/nondistended/normal bowel sounds. No rebound or guarding.  Ext: no edema Skin: warm, dry Neuro: grossly normal, moves all extremities, PERRLA Rectal: normal tone, normal sized prostate, no masses or tenderness  Assessment/Plan:  55 y.o. male presenting for annual physical.  Health Maintenance counseling: 1. Anticipatory guidance: Patient counseled regarding regular dental exams, eye exams, wearing seatbelts.  2. Risk factor reduction:  Advised patient of need for regular exercise and diet rich and fruits and vegetables to reduce risk of heart attack and stroke.  3. Immunizations/screenings/ancillary studies Immunization History  Administered Date(s) Administered  . Influenza Whole 05/08/2008, 06/21/2009  . Influenza-Unspecified 06/11/2016  . Td  08/11/2002  . Tdap 10/16/2014   Health Maintenance Due  Topic Date Due  . Hepatitis C Screening  - screen today 03-29-1961  . HIV Screening - screen today 07/21/1977   4. Prostate cancer screening- low risk based off rectal exam, trend PSA with labs today  Lab Results  Component Value Date   PSA 0.49 10/10/2014   5. Colon cancer screening - 2011 with 10 year repeat 6. Skin cancer prevention- advised sunscreen use  Status of chronic or acute concerns   Essential hypertension S: controlled on lisinopril 60 mg. Patient had been uncontrolled at 40mg  and was increased above typical dose BP Readings from Last 3 Encounters:  07/21/16 116/78  12/07/14 118/80  11/23/14 (!) 146/100  A/P:Continue current meds:  Doing well despite atypical regimen  GERD S: acid reflux controlled on prilosec 40mg  (takes 2 of the 20mg  otc from [facility]) A/P: will trial 20mg  if fails may return to 40mg  dose. Also discussed weight loss- plays softball, golf, bowling- also some walking. [employer] weight 190 or 200 was 165 in HS. Encouraged to cut out soda (right now 3-4 a day)  Depression S: depression controlled on celexa 20mg  dose A/P: PHQ2 of 0 - continue current medicines  Hyperlipidemia S: poorly controlled on no medicine. No myalgias.  Lab Results  Component Value Date   CHOL 201 (H) 10/10/2014   HDL 36.20 (L) 10/10/2014   LDLCALC 132 (H) 10/10/2014   LDLDIRECT 137.8 03/12/2011   TRIG 162.0 (H) 10/10/2014   CHOLHDL 6 10/10/2014   A/P: discussed weight loss and recheck 6 months at physical    No Follow-up on file. Gym membership for Christmas  No orders of the defined types were placed in this encounter.   Meds ordered this encounter  Medications  . albuterol (PROVENTIL HFA;VENTOLIN HFA) 108 (90 Base) MCG/ACT inhaler    Sig: Inhale 2 puffs into the lungs every 6 (six) hours as needed for wheezing or shortness of breath.    Dispense:  1 Inhaler    Refill:  0    Return precautions advised.    Garret Reddish, MD

## 2016-07-21 NOTE — Patient Instructions (Addendum)
Goal BP <130/80 with new guildelines- looks great today  Labs before you leave  No changes in medicine except trial 20mg  of prilosec instead of 40mg - may return to higher dose if doesn't help  Let's focus on weight loss with ultimate goal 190 or 200 but with goal within a year of at least down 21 (210 on our scale!)  Happy to see you back sooner if needed  Would advise regular sunscreen use, see dentist every 6 months, consider eye doctor visits

## 2016-07-21 NOTE — Progress Notes (Signed)
Pre visit review using our clinic review tool, if applicable. No additional management support is needed unless otherwise documented below in the visit note. 

## 2016-07-21 NOTE — Assessment & Plan Note (Signed)
S: acid reflux controlled on prilosec 40mg  (takes 2 of the 20mg  otc from [facility]) A/P: will trial 20mg  if fails may return to 40mg  dose. Also discussed weight loss- plays softball, golf, bowling- also some walking. [employer] weight 190 or 200 was 165 in HS. Encouraged to cut out soda (right now 3-4 a day)

## 2016-07-21 NOTE — Assessment & Plan Note (Addendum)
S: poorly controlled on no medicine. No myalgias.  Lab Results  Component Value Date   CHOL 201 (H) 10/10/2014   HDL 36.20 (L) 10/10/2014   LDLCALC 132 (H) 10/10/2014   LDLDIRECT 137.8 03/12/2011   TRIG 162.0 (H) 10/10/2014   CHOLHDL 6 10/10/2014   A/P: discussed weight loss and recheck 1 year CPE (update today to determine risk level for baseline)

## 2016-07-22 LAB — HEPATITIS C ANTIBODY: HCV AB: NEGATIVE

## 2016-07-22 LAB — HIV ANTIBODY (ROUTINE TESTING W REFLEX): HIV: NONREACTIVE

## 2016-08-04 ENCOUNTER — Other Ambulatory Visit: Payer: Self-pay | Admitting: Family Medicine

## 2016-08-05 MED ORDER — LISINOPRIL 40 MG PO TABS: 40.0000 mg | ORAL_TABLET | Freq: Every day | ORAL | 1 refills | Status: DC

## 2016-08-05 MED ORDER — LISINOPRIL 20 MG PO TABS: 20.0000 mg | ORAL_TABLET | Freq: Every day | ORAL | 0 refills | Status: DC

## 2016-12-30 ENCOUNTER — Other Ambulatory Visit: Payer: Self-pay | Admitting: Family Medicine

## 2017-03-26 ENCOUNTER — Other Ambulatory Visit: Payer: Self-pay | Admitting: Family Medicine

## 2017-06-11 ENCOUNTER — Other Ambulatory Visit: Payer: Self-pay | Admitting: Family Medicine

## 2017-10-05 ENCOUNTER — Ambulatory Visit: Payer: Self-pay | Admitting: *Deleted

## 2017-10-05 NOTE — Telephone Encounter (Signed)
See note

## 2017-10-05 NOTE — Telephone Encounter (Signed)
Eduardo Turner called in with complaint of pain around his heart now for 24-48 hours. He describes it as nagging stating the discomfort mostly just sits there. He does report some shortness of breath at times. Feels clammy today just sitting. Denies pain radiating. He reports he has had a cough 2-3 weeks and at times the coughing is severe however the cough has improved lately.  Reason for Disposition . [1] Chest pain lasts > 5 minutes AND [2] age > 81 AND [3] at least one cardiac risk factor (i.e., hypertension, diabetes, obesity, smoker or strong family history of heart disease)  Answer Assessment - Initial Assessment Questions 1. LOCATION: "Where does it hurt?"       Around the heart  2. RADIATION: "Does the pain go anywhere else?" (e.g., into neck, jaw, arms, back)    Comes and goes but feels like a nagging pain almost all the time for the last 24 hours.  3. ONSET: "When did the chest pain begin?" (Minutes, hours or days)      24-48 hours ago along with vomiting on Saturday night. 4. PATTERN "Does the pain come and go, or has it been constant since it started?"  "Does it get worse with exertion?"      Comes and goes but not tied to exertion.Just sitting this morning and it hurts. 5. DURATION: "How long does it last" (e.g., seconds, minutes, hours)     Almost nagging-like for last 24 hours. 6. SEVERITY: "How bad is the pain?"  (e.g., Scale 1-10; mild, moderate, or severe)    - MILD (1-3): doesn't interfere with normal activities     - MODERATE (4-7): interferes with normal activities or awakens from sleep    - SEVERE (8-10): excruciating pain, unable to do any normal activities       4-7 7. CARDIAC RISK FACTORS: "Do you have any history of heart problems or risk factors for heart disease?" (e.g., prior heart attack, angina; high blood pressure, diabetes, being overweight, high cholesterol, smoking, or strong family history of heart disease)    no 8. PULMONARY RISK FACTORS: "Do you have any  history of lung disease?"  (e.g., blood clots in lung, asthma, emphysema, birth control pills)   asthma 9. CAUSE: "What do you think is causing the chest pain?"    Has had a bad productive cough for 2-3 weeks that has been horrible and hurts to cough at times.  10. OTHER SYMPTOMS: "Do you have any other symptoms?" (e.g., dizziness, nausea, vomiting, sweating, fever, difficulty breathing, cough)       Feels sweaty/clammy today. Cough-has been on mucinex 11. PREGNANCY: "Is there any chance you are pregnant?" "When was your last menstrual period?"     na  Protocols used: CHEST PAIN-A-AH

## 2017-10-05 NOTE — Telephone Encounter (Signed)
Reasonable- please make sure he is seen there by end of day or give him a call at that time if not in ER

## 2017-10-06 ENCOUNTER — Encounter: Payer: Self-pay | Admitting: Family Medicine

## 2017-10-06 ENCOUNTER — Ambulatory Visit (INDEPENDENT_AMBULATORY_CARE_PROVIDER_SITE_OTHER): Payer: BLUE CROSS/BLUE SHIELD | Admitting: Family Medicine

## 2017-10-06 VITALS — BP 142/84 | HR 60 | Temp 97.6°F | Ht 69.75 in | Wt 233.4 lb

## 2017-10-06 DIAGNOSIS — J329 Chronic sinusitis, unspecified: Secondary | ICD-10-CM

## 2017-10-06 DIAGNOSIS — R05 Cough: Secondary | ICD-10-CM

## 2017-10-06 DIAGNOSIS — R059 Cough, unspecified: Secondary | ICD-10-CM

## 2017-10-06 MED ORDER — BENZONATATE 200 MG PO CAPS: 200.0000 mg | ORAL_CAPSULE | Freq: Two times a day (BID) | ORAL | 0 refills | Status: DC | PRN

## 2017-10-06 MED ORDER — DOXYCYCLINE HYCLATE 100 MG PO TABS: 100.0000 mg | ORAL_TABLET | Freq: Two times a day (BID) | ORAL | 0 refills | Status: DC

## 2017-10-06 NOTE — Patient Instructions (Signed)
Start the doxycycline and tessalon.  Please stay well hydrated.  You can take tylenol and/or motrin as needed for low grade fever and pain.  Please let me know if your symptoms worsen or fail to improve.  Take care, Dr Jerline Pain

## 2017-10-06 NOTE — Telephone Encounter (Signed)
Patient was seen by Dr. Dimas Chyle in office this morning

## 2017-10-06 NOTE — Progress Notes (Signed)
    Subjective:  Eduardo Turner is a 57 y.o. male who presents today for same-day appointment with a chief complaint of cough.   HPI:  Cough, Acute Problem Started about 3 weeks ago.  Stable over that time.  Associated with sinus congestion and sputum production.  Occasional chills.  No fevers.  No shortness of breath.  Symptoms are worse at night.  Tried over-the-counter Mucinex which is helped some.  Cough will occasionally wake him up at night.  Went on a cruise about 3 weeks ago with a friend who had upper respiratory tract infection symptoms.  No other obvious alleviating or aggravating factors.  ROS: Per HPI  PMH: He reports that  has never smoked. he has never used smokeless tobacco. He reports that he drinks alcohol. He reports that he does not use drugs.  Objective:  Physical Exam: BP (!) 142/84 (BP Location: Left Arm, Patient Position: Sitting, Cuff Size: Normal)   Pulse 60   Temp 97.6 F (36.4 C) (Oral)   Ht 5' 9.75" (1.772 m)   Wt 233 lb 6.4 oz (105.9 kg)   SpO2 100%   BMI 33.73 kg/m   Gen: NAD, resting comfortably HEENT: TMs clear bilaterally.  Left maxillary sinus decreased to transillumination.  Right maxillary sinus clear to transillumination.  Oropharynx clear.  No lymphadenopathy. CV: RRR with no murmurs appreciated Pulm: NWOB, CTAB with no crackles, wheezes, or rhonchi  Assessment/Plan:  Cough/Sinusitis Given 3-week course, will treat with antibiotics.  Start doxycycline 100 mg twice daily for 7-day course.  We will also send in Tessalon for his cough. Recommended tylenol and/or motrin as needed for low grade fever and pain. Encouraged good oral hydration. Return precautions reviewed. Follow up as needed.   Algis Greenhouse. Jerline Pain, MD 10/06/2017 8:27 AM

## 2017-10-08 ENCOUNTER — Ambulatory Visit: Payer: BLUE CROSS/BLUE SHIELD | Admitting: Family Medicine

## 2018-03-02 ENCOUNTER — Ambulatory Visit: Payer: Self-pay

## 2018-03-02 NOTE — Telephone Encounter (Signed)
Pt. called to report 2 episodes of bright red blood in urine.  Stated he noted his urine was a dark amber color, starting on Sunday, and has continued to be dark amber.  Reported noting a 4-5 " string of blood x 1 yesterday, in toilet bowl, with urination.  C/o several drops of bright red blood in the toilet water x 1 today.  Denied fever/ chills.  Denied low abdominal, back, or flank pain.  Denied burning, frequency, or urgency with urination.  Stated his urinary stream is normal.  Denied difficulty emptying bladder.  Reported he had a stomach bug last week with chills, nausea, and diarrhea.  Stated he has not had chills for about a week.  Sched. appt. 7/24 @ 11:00 AM with PCP office.  Pt. strongly advised to go to the ER tonight if has continued presence of bright red blood or passage of clots with urination.  Verb. Understanding.  Agrees with plan.              Reason for Disposition . Blood in urine  (Exception: could be normal menstrual bleeding)  Answer Assessment - Initial Assessment Questions 1. COLOR of URINE: "Describe the color of the urine."  (e.g., tea-colored, pink, red, blood clots, bloody)     Dark amber with drops of bright red blood today x one episode today; then amber 2. ONSET: "When did the bleeding start?"      2 days ago, urine noted to be darker than normal; string of blood in urine   3. EPISODES: "How many times has there been blood in the urine?" or "How many times today?"     2 episodes ; once yesterday and once today 4. PAIN with URINATION: "Is there any pain with passing your urine?" If so, ask: "How bad is the pain?"  (Scale 1-10; or mild, moderate, severe)    - MILD - complains slightly about urination hurting    - MODERATE - interferes with normal activities      - SEVERE - excruciating, unwilling or unable to urinate because of the pain      Denied pain 5. FEVER: "Do you have a fever?" If so, ask: "What is your temperature, how was it measured, and when did it  start?"     Not in past couple of days; reported fever about one week ago  6. ASSOCIATED SYMPTOMS: "Are you passing urine more frequently than usual?"    Denied frequency 7. OTHER SYMPTOMS: "Do you have any other symptoms?" (e.g., back/flank pain, abdominal pain, vomiting)     Denied lower abd. Pain or back/ flank pain ; c/o nausea ; had a stomach virus one week ago with chills, nausea and diarrhea.  Protocols used: URINE - BLOOD IN-A-AH

## 2018-03-03 ENCOUNTER — Encounter: Payer: Self-pay | Admitting: Family Medicine

## 2018-03-03 ENCOUNTER — Ambulatory Visit (INDEPENDENT_AMBULATORY_CARE_PROVIDER_SITE_OTHER): Payer: BLUE CROSS/BLUE SHIELD | Admitting: Family Medicine

## 2018-03-03 VITALS — BP 140/84 | HR 82 | Temp 98.6°F | Ht 70.0 in | Wt 236.0 lb

## 2018-03-03 DIAGNOSIS — R319 Hematuria, unspecified: Secondary | ICD-10-CM

## 2018-03-03 DIAGNOSIS — R822 Biliuria: Secondary | ICD-10-CM

## 2018-03-03 DIAGNOSIS — I10 Essential (primary) hypertension: Secondary | ICD-10-CM

## 2018-03-03 LAB — POCT URINALYSIS DIPSTICK
Glucose, UA: NEGATIVE
Ketones, UA: NEGATIVE
Leukocytes, UA: NEGATIVE
Nitrite, UA: NEGATIVE
Protein, UA: NEGATIVE
RBC UA: NEGATIVE
UROBILINOGEN UA: 0.2 U/dL
pH, UA: 5.5 (ref 5.0–8.0)

## 2018-03-03 LAB — CBC
HEMATOCRIT: 42.1 % (ref 39.0–52.0)
Hemoglobin: 14.5 g/dL (ref 13.0–17.0)
MCHC: 34.4 g/dL (ref 30.0–36.0)
MCV: 85.3 fl (ref 78.0–100.0)
Platelets: 218 10*3/uL (ref 150.0–400.0)
RBC: 4.94 Mil/uL (ref 4.22–5.81)
RDW: 13.3 % (ref 11.5–15.5)
WBC: 6.5 10*3/uL (ref 4.0–10.5)

## 2018-03-03 LAB — COMPREHENSIVE METABOLIC PANEL
ALT: 30 U/L (ref 0–53)
AST: 23 U/L (ref 0–37)
Albumin: 4.4 g/dL (ref 3.5–5.2)
Alkaline Phosphatase: 48 U/L (ref 39–117)
BUN: 18 mg/dL (ref 6–23)
CHLORIDE: 104 meq/L (ref 96–112)
CO2: 30 meq/L (ref 19–32)
Calcium: 9 mg/dL (ref 8.4–10.5)
Creatinine, Ser: 0.84 mg/dL (ref 0.40–1.50)
GFR: 100.6 mL/min (ref >60.00)
GLUCOSE: 107 mg/dL — AB (ref 70–99)
POTASSIUM: 3.3 meq/L — AB (ref 3.5–5.1)
SODIUM: 141 meq/L (ref 135–145)
Total Bilirubin: 1.3 mg/dL — ABNORMAL HIGH (ref 0.2–1.2)
Total Protein: 7.3 g/dL (ref 6.0–8.3)

## 2018-03-03 NOTE — Telephone Encounter (Signed)
FYI

## 2018-03-03 NOTE — Patient Instructions (Signed)
It was very nice to see you today!  I think your dark urine was a result of the GI illness running its course. You symptoms should continue to improve over the next 1-2 weeks.  We will check blood work today to make sure there is nothing else going on.  Please let me know or let Dr. Yong Channel know if you have more blood clots in your urine.  Take care, Dr Jerline Pain

## 2018-03-03 NOTE — Telephone Encounter (Signed)
Patient is being seen in the office now. 

## 2018-03-03 NOTE — Progress Notes (Signed)
   Subjective:  Eduardo Turner is a 57 y.o. male who presents today for same-day appointment with a chief complaint of hematuria.   HPI:  Hematuria, Acute problem Started 3 days ago.  Recently had a GI illness about a week ago with associated chills, abdominal pain, nausea, and diarrhea.  Symptoms resolved about a week ago.  He has not had any recurrence of those symptoms.  About 3 days ago, he passed a large clot through his urine.  He has noticed a few drops of red blood in the toilet after urinating over last couple of days.  No further passage of clots.  No dysuria.  No flank pain.  No nausea or vomiting.  No hesitancy or frequency.  No urgency.  No obvious precipitating events.  No other obvious alleviating or aggravating factors.  ROS: Per HPI  PMH: He reports that he has never smoked. He has never used smokeless tobacco. He reports that he drinks alcohol. He reports that he does not use drugs.  Objective:  Physical Exam: BP 140/84 (BP Location: Left Arm, Patient Position: Sitting, Cuff Size: Normal)   Pulse 82   Temp 98.6 F (37 C) (Oral)   Ht 5\' 10"  (1.778 m)   Wt 236 lb (107 kg)   SpO2 97%   BMI 33.86 kg/m   Gen: NAD, resting comfortably CV: RRR with no murmurs appreciated Pulm: NWOB, CTAB with no crackles, wheezes, or rhonchi GI: Normal bowel sounds present. Soft, Nontender, Nondistended. MSK: No edema, cyanosis, or clubbing noted.  No CVA tenderness. Skin: Warm, dry Neuro: Grossly normal, moves all extremities Psych: Normal affect and thought content  Results for orders placed or performed in visit on 03/03/18 (from the past 24 hour(s))  POCT urinalysis dipstick     Status: Abnormal   Collection Time: 03/03/18 10:59 AM  Result Value Ref Range   Color, UA dark yellow    Clarity, UA clear    Glucose, UA Negative Negative   Bilirubin, UA 1+    Ketones, UA N    Spec Grav, UA >=1.030 (A) 1.010 - 1.025   Blood, UA N    pH, UA 5.5 5.0 - 8.0   Protein, UA Negative  Negative   Urobilinogen, UA 0.2 0.2 or 1.0 E.U./dL   Nitrite, UA N    Leukocytes, UA Negative Negative   Appearance     Odor      Assessment/Plan:  Gross hematuria/bilirubinuria Unclear etiology.  He does not have any blood on his UA today, however does have 1+ bilirubin and elevated specific gravity.  It is possible that his symptoms could be sequela of his recent GI illness.  Does not have any clinical signs or symptoms of nephrolithiasis or UTI.  We will check CBC and CMET to evaluate renal function, serum bilirubin, hemoglobin, and liver function.  Given the symptoms seem to be resolving, would not pursue further work-up at this point pending results of his blood work.  Would repeat UA in 3 to 4 weeks to ensure hematuria has resolved.  If symptoms return, or worsen, would consider full urological evaluation possible including cystoscopy and advanced imaging.  Discussed reasons to return to care.  Essential hypertension Borderline blood pressure today.  He is currently on lisinopril 40 mg daily.  We will check renal function as noted above.  Continue current medications.  If has significant renal abnormalities, will need to switch to different antihypertensive.  Algis Greenhouse. Jerline Pain, MD 03/03/2018 11:01 AM

## 2018-03-03 NOTE — Telephone Encounter (Signed)
Noted.  Algis Greenhouse. Jerline Pain, MD 03/03/2018 10:12 AM

## 2018-03-04 NOTE — Progress Notes (Signed)
Please inform patient of the following:  His labs did not show any significant abnormalities. I would like for him to come back in 1-2 weeks for a lab visit to recheck his UA. He should let us know if he has any recurrence of symptoms before his lab appointment.  Eduardo Turner. Jerline Pain, MD 03/04/2018 3:44 PM

## 2018-04-12 ENCOUNTER — Other Ambulatory Visit: Payer: Self-pay | Admitting: Family Medicine

## 2018-04-20 ENCOUNTER — Other Ambulatory Visit: Payer: Self-pay

## 2018-05-25 ENCOUNTER — Ambulatory Visit (INDEPENDENT_AMBULATORY_CARE_PROVIDER_SITE_OTHER): Payer: BLUE CROSS/BLUE SHIELD

## 2018-05-25 DIAGNOSIS — Z23 Encounter for immunization: Secondary | ICD-10-CM

## 2018-05-25 NOTE — Patient Instructions (Signed)
There are no preventive care reminders to display for this patient.  No flowsheet data found.  

## 2018-05-25 NOTE — Progress Notes (Signed)
Patient in today for Flu Vaccine. VIS given. Administered in right arm. Patient tolerated well.

## 2018-06-07 ENCOUNTER — Other Ambulatory Visit: Payer: Self-pay | Admitting: Family Medicine

## 2018-06-23 ENCOUNTER — Encounter: Payer: Self-pay | Admitting: Physician Assistant

## 2018-06-23 ENCOUNTER — Ambulatory Visit (INDEPENDENT_AMBULATORY_CARE_PROVIDER_SITE_OTHER): Payer: BLUE CROSS/BLUE SHIELD | Admitting: Physician Assistant

## 2018-06-23 ENCOUNTER — Other Ambulatory Visit: Payer: Self-pay | Admitting: Family Medicine

## 2018-06-23 VITALS — BP 136/80 | HR 60 | Temp 97.6°F | Ht 70.0 in | Wt 235.2 lb

## 2018-06-23 DIAGNOSIS — J069 Acute upper respiratory infection, unspecified: Secondary | ICD-10-CM

## 2018-06-23 MED ORDER — BENZONATATE 200 MG PO CAPS: 200.0000 mg | ORAL_CAPSULE | Freq: Two times a day (BID) | ORAL | 0 refills | Status: DC | PRN

## 2018-06-23 MED ORDER — AMOXICILLIN-POT CLAVULANATE 875-125 MG PO TABS: 1.0000 | ORAL_TABLET | Freq: Two times a day (BID) | ORAL | 0 refills | Status: DC

## 2018-06-23 NOTE — Progress Notes (Signed)
Eduardo Turner is a 57 y.o. male here for a new problem.  I acted as a Education administrator for [employer], PA-C Anselmo Pickler, LPN  History of Present Illness:   Chief Complaint  Patient presents with  . Cough    Cough  This is a new problem. Episode onset: Started 2 weeks ago. The problem has been gradually worsening. The problem occurs every few hours. The cough is productive of sputum (Expectorating green sputum). Associated symptoms include nasal congestion (Green drainage), postnasal drip, a sore throat, shortness of breath and wheezing. Pertinent negatives include no chills, ear congestion, ear pain, fever or headaches. Associated symptoms comments: Nose bleed last night. The symptoms are aggravated by lying down. Treatments tried: Nyquil, Mucinex. The treatment provided mild relief. His past medical history is significant for asthma. There is no history of bronchitis or pneumonia.    Past Medical History:  Diagnosis Date  . Allergy   . Asthma   . Dysplastic nevi   . GERD (gastroesophageal reflux disease)   . Hypertension   . Multiple lipomas   . Peptic esophageal ulcer      Social History   Socioeconomic History  . Marital status: Married    Spouse name: Not on file  . Number of children: Not on file  . Years of education: Not on file  . Highest education level: Not on file  Occupational History  . Not on file  Social Needs  . Financial resource strain: Not on file  . Food insecurity:    Worry: Not on file    Inability: Not on file  . Transportation needs:    Medical: Not on file    Non-medical: Not on file  Tobacco Use  . Smoking status: Never Smoker  . Smokeless tobacco: Never Used  Substance and Sexual Activity  . Alcohol use: Yes    Alcohol/week: 0.0 standard drinks    Comment: occ  . Drug use: No  . Sexual activity: Not on file  Lifestyle  . Physical activity:    Days per week: Not on file    Minutes per session: Not on file  . Stress: Not on file   Relationships  . Social connections:    Talks on phone: Not on file    Gets together: Not on file    Attends religious service: Not on file    Active member of club or organization: Not on file    Attends meetings of clubs or organizations: Not on file    Relationship status: Not on file  . Intimate partner violence:    Fear of current or ex partner: Not on file    Emotionally abused: Not on file    Physically abused: Not on file    Forced sexual activity: Not on file  Other Topics Concern  . Not on file  Social History Narrative   Married. 2 children both out of house. 1 granddaughter in 2017.       Radio broadcast assistant at [employer].     Past Surgical History:  Procedure Laterality Date  . SHOULDER SURGERY     bilateral    Family History  Problem Relation Age of Onset  . Other Mother        routine surgery lead to sepsis- hysterectomy  . Heart attack Father        age 36  . Bipolar disorder Sister     Allergies  Allergen Reactions  . Erythromycin Ethylsuccinate     REACTION:  HIVES    Current Medications:   Current Outpatient Medications:  .  citalopram (CELEXA) 20 MG tablet, Take 1 tablet (20 mg total) by mouth daily., Disp: 100 tablet, Rfl: 3 .  ibuprofen (ADVIL,MOTRIN) 200 MG tablet, Take 200 mg by mouth as needed.  , Disp: , Rfl:  .  lisinopril (PRINIVIL,ZESTRIL) 20 MG tablet, Take by mouth., Disp: , Rfl:  .  lisinopril (PRINIVIL,ZESTRIL) 40 MG tablet, TAKE 1 TABLET BY MOUTH  DAILY, Disp: 90 tablet, Rfl: 0 .  loratadine (CLARITIN) 10 MG tablet, Take 10 mg by mouth daily.  , Disp: , Rfl:  .  omeprazole (PRILOSEC) 40 MG capsule, Take 1 capsule (40 mg total) by mouth daily., Disp: 100 capsule, Rfl: 3 .  albuterol (PROVENTIL HFA;VENTOLIN HFA) 108 (90 Base) MCG/ACT inhaler, Inhale 2 puffs into the lungs every 6 (six) hours as needed for wheezing or shortness of breath. (Patient not taking: Reported on 06/23/2018), Disp: 1 Inhaler, Rfl: 0 .  amoxicillin-clavulanate  (AUGMENTIN) 875-125 MG tablet, Take 1 tablet by mouth 2 (two) times daily., Disp: 20 tablet, Rfl: 0 .  benzonatate (TESSALON) 200 MG capsule, Take 1 capsule (200 mg total) by mouth 2 (two) times daily as needed for cough., Disp: 20 capsule, Rfl: 0   Review of Systems:   Review of Systems  Constitutional: Negative for chills and fever.  HENT: Positive for postnasal drip and sore throat. Negative for ear pain.   Respiratory: Positive for cough, shortness of breath and wheezing.   Neurological: Negative for headaches.    Vitals:   Vitals:   06/23/18 1411  BP: 136/80  Pulse: 60  Temp: 97.6 F (36.4 C)  TempSrc: Oral  SpO2: 95%  Weight: 235 lb 4 oz (106.7 kg)  Height: 5\' 10"  (1.778 m)     Body mass index is 33.75 kg/m.  Physical Exam:   Physical Exam  Constitutional: He appears well-developed. He is cooperative.  Non-toxic appearance. He does not have a sickly appearance. He does not appear ill. No distress.  HENT:  Head: Normocephalic and atraumatic.  Right Ear: External ear and ear canal normal. Tympanic membrane is erythematous. Tympanic membrane is not retracted and not bulging. A middle ear effusion is present.  Left Ear: External ear and ear canal normal. Tympanic membrane is erythematous. Tympanic membrane is not retracted and not bulging. A middle ear effusion is present.  Nose: Nose normal. Right sinus exhibits no maxillary sinus tenderness and no frontal sinus tenderness. Left sinus exhibits no maxillary sinus tenderness and no frontal sinus tenderness.  Mouth/Throat: Uvula is midline. No posterior oropharyngeal edema or posterior oropharyngeal erythema.  TM erythema R >> L  Eyes: Conjunctivae and lids are normal.  Neck: Trachea normal.  Cardiovascular: Normal rate, regular rhythm, S1 normal, S2 normal and normal heart sounds.  Pulmonary/Chest: Effort normal and breath sounds normal. He has no decreased breath sounds. He has no wheezes. He has no rhonchi. He has no  rales.  Lymphadenopathy:    He has no cervical adenopathy.  Neurological: He is alert.  Skin: Skin is warm, dry and intact.  Psychiatric: He has a normal mood and affect. His speech is normal and behavior is normal.  Nursing note and vitals reviewed.   Assessment and Plan:    Rustyn was seen today for cough.  Diagnoses and all orders for this visit:  Upper respiratory tract infection, unspecified type  Other orders -     amoxicillin-clavulanate (AUGMENTIN) 875-125 MG tablet; Take 1 tablet by  mouth 2 (two) times daily. -     benzonatate (TESSALON) 200 MG capsule; Take 1 capsule (200 mg total) by mouth 2 (two) times daily as needed for cough.   No red flags on exam. Bilateral otitis effusion. Will initiate Augmentin and Tessalon perles per orders. Discussed taking medications as prescribed. Reviewed return precautions including worsening fever, SOB, worsening cough or other concerns. Push fluids and rest. I recommend that patient follow-up if symptoms worsen or persist despite treatment x 7-10 days, sooner if needed.  . Reviewed expectations re: course of current medical issues. . Discussed self-management of symptoms. . Outlined signs and symptoms indicating need for more acute intervention. . Patient verbalized understanding and all questions were answered. . See orders for this visit as documented in the electronic medical record. . Patient received an After-Visit Summary.  CMA or LPN served as scribe during this visit. History, Physical, and Plan performed by medical provider. The above documentation has been reviewed and is accurate and complete.  Inda Coke, PA-C

## 2018-06-23 NOTE — Patient Instructions (Signed)
It was great to see you!  Use medication as prescribed: Augmentin antibiotic and Tessalon perles for cough  Push fluids and get plenty of rest. Please return if you are not improving as expected, or if you have high fevers (>101.5) or difficulty swallowing or worsening productive cough.  Call clinic with questions.  I hope you start feeling better soon!   Follow-up at your convenience [facility]) to discuss blood pressure control with Dr. Yong Channel.

## 2018-07-01 ENCOUNTER — Encounter: Payer: Self-pay | Admitting: Family Medicine

## 2018-07-03 ENCOUNTER — Encounter: Payer: Self-pay | Admitting: Family Medicine

## 2018-07-15 ENCOUNTER — Encounter: Payer: BLUE CROSS/BLUE SHIELD | Admitting: Family Medicine

## 2018-07-23 ENCOUNTER — Encounter: Payer: Self-pay | Admitting: Family Medicine

## 2018-07-23 ENCOUNTER — Ambulatory Visit (INDEPENDENT_AMBULATORY_CARE_PROVIDER_SITE_OTHER): Payer: BLUE CROSS/BLUE SHIELD | Admitting: Family Medicine

## 2018-07-23 VITALS — BP 132/78 | HR 95 | Temp 98.3°F | Ht 70.0 in | Wt 233.0 lb

## 2018-07-23 DIAGNOSIS — Z0001 Encounter for general adult medical examination with abnormal findings: Secondary | ICD-10-CM

## 2018-07-23 DIAGNOSIS — E785 Hyperlipidemia, unspecified: Secondary | ICD-10-CM

## 2018-07-23 DIAGNOSIS — Z125 Encounter for screening for malignant neoplasm of prostate: Secondary | ICD-10-CM

## 2018-07-23 DIAGNOSIS — K219 Gastro-esophageal reflux disease without esophagitis: Secondary | ICD-10-CM

## 2018-07-23 DIAGNOSIS — F325 Major depressive disorder, single episode, in full remission: Secondary | ICD-10-CM

## 2018-07-23 DIAGNOSIS — I10 Essential (primary) hypertension: Secondary | ICD-10-CM

## 2018-07-23 DIAGNOSIS — L918 Other hypertrophic disorders of the skin: Secondary | ICD-10-CM

## 2018-07-23 LAB — COMPREHENSIVE METABOLIC PANEL
ALT: 34 U/L (ref 0–53)
AST: 28 U/L (ref 0–37)
Albumin: 4.4 g/dL (ref 3.5–5.2)
Alkaline Phosphatase: 46 U/L (ref 39–117)
BILIRUBIN TOTAL: 1.3 mg/dL — AB (ref 0.2–1.2)
BUN: 16 mg/dL (ref 6–23)
CO2: 30 meq/L (ref 19–32)
CREATININE: 0.76 mg/dL (ref 0.40–1.50)
Calcium: 9.1 mg/dL (ref 8.4–10.5)
Chloride: 102 mEq/L (ref 96–112)
GFR: 112.76 mL/min (ref >60.00)
Glucose, Bld: 94 mg/dL (ref 70–99)
Potassium: 4.2 mEq/L (ref 3.5–5.1)
Sodium: 141 mEq/L (ref 135–145)
TOTAL PROTEIN: 6.8 g/dL (ref 6.0–8.3)

## 2018-07-23 LAB — URINALYSIS
Bilirubin Urine: NEGATIVE
Hgb urine dipstick: NEGATIVE
Ketones, ur: NEGATIVE
LEUKOCYTES UA: NEGATIVE
Nitrite: NEGATIVE
Specific Gravity, Urine: 1.015 (ref 1.000–1.030)
Total Protein, Urine: NEGATIVE
Urine Glucose: NEGATIVE
Urobilinogen, UA: 1 (ref 0.0–1.0)
pH: 7 (ref 5.0–8.0)

## 2018-07-23 LAB — CBC
HCT: 45.8 % (ref 39.0–52.0)
Hemoglobin: 15.5 g/dL (ref 13.0–17.0)
MCHC: 34 g/dL (ref 30.0–36.0)
MCV: 86.4 fl (ref 78.0–100.0)
PLATELETS: 171 10*3/uL (ref 150.0–400.0)
RBC: 5.29 Mil/uL (ref 4.22–5.81)
RDW: 13.7 % (ref 11.5–15.5)
WBC: 5.7 10*3/uL (ref 4.0–10.5)

## 2018-07-23 LAB — LIPID PANEL
CHOL/HDL RATIO: 6
Cholesterol: 234 mg/dL — ABNORMAL HIGH (ref 0–200)
HDL: 42.5 mg/dL (ref >39.00)
LDL Cholesterol: 166 mg/dL — ABNORMAL HIGH (ref 0–99)
NonHDL: 191.81
Triglycerides: 129 mg/dL (ref 0.0–149.0)
VLDL: 25.8 mg/dL (ref 0.0–40.0)

## 2018-07-23 LAB — PSA: PSA: 0.48 ng/mL (ref 0.10–4.00)

## 2018-07-23 NOTE — Assessment & Plan Note (Signed)
S: controlled on lisinopril 40 mg-prior provider had him on 60 mg dose but has done okay on 40 mg BP Readings from Last 3 Encounters:  07/23/18 132/78  06/23/18 136/80  03/03/18 140/84  A/P: We discussed blood pressure goal of <140/90. Continue current meds

## 2018-07-23 NOTE — Progress Notes (Signed)
Phone: [phone]  Subjective:  Patient presents today for their annual physical. Chief complaint-noted.   See problem oriented charting- ROS- full  review of systems was completed and negative per full review of systems sheet completed by patient  The following were reviewed and entered/updated in epic: Past Medical History:  Diagnosis Date  . Allergy   . Asthma   . Dysplastic nevi   . GERD (gastroesophageal reflux disease)   . Hypertension   . Multiple lipomas   . Peptic esophageal ulcer    Patient Active Problem List   Diagnosis Date Noted  . Hyperlipidemia 07/21/2016    Priority: Medium  . GERD 09/19/2009    Priority: Medium  . Major depression in full remission ([place]) 06/21/2009    Priority: Medium  . Essential hypertension 02/22/2007    Priority: Medium  . Asthma 02/22/2007    Priority: Medium  . Ophthalmic migraine 09/20/2010    Priority: Low  . DYSPLASTIC NEVUS, BACK 02/05/2008    Priority: Low  . Lipoma 01/31/2008    Priority: Low  . ERECTILE DYSFUNCTION, MILD 03/17/2007    Priority: Low  . Allergic rhinitis 02/22/2007    Priority: Low   Past Surgical History:  Procedure Laterality Date  . SHOULDER SURGERY     bilateral    Family History  Problem Relation Age of Onset  . Other Mother        routine surgery lead to sepsis- hysterectomy  . Heart attack Father        age 38  . Bipolar disorder Sister     Medications- reviewed and updated Current Outpatient Medications  Medication Sig Dispense Refill  . ibuprofen (ADVIL,MOTRIN) 200 MG tablet Take 200 mg by mouth as needed.      [name] lisinopril (PRINIVIL,ZESTRIL) 40 MG tablet TAKE 1 TABLET BY MOUTH  DAILY 90 tablet 0  . loratadine (CLARITIN) 10 MG tablet Take 10 mg by mouth daily.      [name] omeprazole (PRILOSEC) 40 MG capsule Take 1 capsule (40 mg total) by mouth daily. 100 capsule 3   Allergies-reviewed and updated Allergies  Allergen Reactions  . Erythromycin Ethylsuccinate     REACTION: HIVES      Social History   Social History Narrative   Married. 2 children both out of house. 1 granddaughter in 2017.       Radio broadcast assistant at [employer].     Objective: BP 132/78 (BP Location: Left Arm, Patient Position: Sitting, Cuff Size: Normal)   Pulse 95   Temp 98.3 F (36.8 C) (Oral)   Ht 5\' 10"  (1.778 m)   Wt 233 lb (105.7 kg)   SpO2 96%   BMI 33.43 kg/m  Gen: NAD, resting comfortably HEENT: Mucous membranes are moist. Oropharynx normal Neck: no thyromegaly CV: RRR no murmurs rubs or gallops Lungs: CTAB no crackles, wheeze, rhonchi Abdomen: soft/nontender/nondistended/normal bowel sounds. No rebound or guarding.  Ext: no edema Skin: warm, dry, in left axilla there is a skin tag with 1/2 cm base and then 1/2 by 1 cm fleshy portoin Neuro: grossly normal, moves all extremities, PERRLA   Right axilla skin tag removal Completed today. 0.5 cc of lidocaine with epinephrine used to anesthetize. Then betadine used to further cleanse before using pick ups to pick up lesion and scissors to cut the base. Hemostasis achieved with silver nitrate. Patient tolerated procedure well. Verbal consent before visit and post procedure aftercare was discussed.    Assessment/Plan:  57 y.o. male presenting for annual physical.  Health Maintenance counseling: 1. Anticipatory guidance: Patient counseled regarding regular dental exams -q6 months, eye exams -yearly,  avoiding smoking and second hand smoke, limiting alcohol to 2 beverages per day - mainly with boweling.   2. Risk factor reduction:  Advised patient of need for regular exercise and diet rich and fruits and vegetables to reduce risk of heart attack and stroke. Exercise- work schedule making exercise difficult.  Trying to line up schedule with wife to help have same schedule- they exercise well [place]. Diet-he states trying on weight loss- cut out soda and still having struggle- sips breakfast and then may snack on something like crackers,  often skips lunch and then eats big dinner and not always the best choices such as pizza or burger- wife works opposite hours and not really able to help with prep for his meals- he is picking up a lot of food. He is working 5-6 days a week. Has cut out sodas- doesn't drink tea- beer only about once a week with bowling. Suggested more home prep and more regular meals.  Wt Readings from Last 3 Encounters:  07/23/18 233 lb (105.7 kg)  06/23/18 235 lb 4 oz (106.7 kg)  03/03/18 236 lb (107 kg)  3. Immunizations/screenings/ancillary studies-discussed Shingrix- declines until next year- will rediscuss  Immunization History  Administered Date(s) Administered  . Influenza Whole 05/08/2008, 06/21/2009  . Influenza,inj,Quad PF,6+ Mos 05/25/2018  . Influenza-Unspecified 06/11/2016  . Td 08/11/2002  . Tdap 10/16/2014  4. Prostate cancer screening-psa low risk trend- will get psa today- defer rectal unless PSA trend concerning  Lab Results  Component Value Date   PSA 0.46 07/21/2016   PSA 0.49 10/10/2014   5. Colon cancer screening - 11/06/2009 with 10-year follow-up 6. Skin cancer screening- no dermatologist. advised regular sunscreen use. Denies worrisome, changing, or new skin lesions- other than skin tag in axilla and known lipoma  7.  Never smoker  Status of chronic or acute concerns   Skin tag S: skin tag right axilla for 2 years- intermittently gets red/inflammed/irritated- not presently irritated.  Stalk like projection A/P: Large skin tag in right axilla-recurrent irritation and inflammation-this is reasonable to remove in light of this.  Procedure performed today-see note above  Asthma history- no recent issues  Essential hypertension S: controlled on lisinopril 40 mg-prior provider had him on 60 mg dose but has done okay on 40 mg BP Readings from Last 3 Encounters:  07/23/18 132/78  06/23/18 136/80  03/03/18 140/84  A/P: We discussed blood pressure goal of <140/90. Continue current  meds   Hyperlipidemia Hyperlipidemia-poorly controlled on no medication-plan have been healthy lifestyle changes.  Weight up 2 pounds since last physical. Dad with MI at 56- will use 12% cut off for ascvd risk. Prefers no statin if possible  GERD GERD-patient compliant with Prilosec 40 mg previously-recommended trial 20 mg- he failed this. Might try again with weight loss when/if occurs in future  Major depression in full remission ([place]) S: Depression was reasonably controlled on Celexa 20 mg but he has not taken this in years and continues to do well-  PHQ 9 of 3 today. A/P: Stable-continue current medications. He thinks work stress being better has helped- had some prior family issues as well  Future Appointments  Date Time Provider [place]  07/26/2019  2:20 PM Marin Olp, MD [facility]  1 year physical planned  Lab/Order associations: FASTING  Encounter for general adult medical examination with abnormal findings - Plan: CBC, Lipid  panel, Comprehensive metabolic panel, PSA, Urinalysis, Urinalysis  Hyperlipidemia, unspecified hyperlipidemia type - Plan: CBC, Lipid panel, Comprehensive metabolic panel, Urinalysis, Urinalysis  Screening for prostate cancer - Plan: PSA  Essential hypertension  Gastroesophageal reflux disease without esophagitis  Major depressive disorder with single episode, in full remission ([place])  Skin tag  Return precautions advised.  Garret Reddish, MD

## 2018-07-23 NOTE — Assessment & Plan Note (Signed)
Hyperlipidemia-poorly controlled on no medication-plan have been healthy lifestyle changes.  Weight up 2 pounds since last physical. Dad with MI at 58- will use 12% cut off for ascvd risk. Prefers no statin if possible

## 2018-07-23 NOTE — Patient Instructions (Addendum)
Please stop by lab before you go  Consider shingrix next year  Want to see you drop at least 10 lbs over next year if not more- healthy eating - particularly home made meals - and regular exercise will likely help. We do have a PA here who is also a dietician - if you feel like you are not making good progress- we can refer to her for session . I do like weight watchers as well- has helped several of my patients with weight loss.   Removed large skin tag in right axilla/armpit today. IF rebleeds- hold pressure for up to 30 minutes or even an hour- if continues to bleed- seek care at hospital if after hours. Watch out for redness/swelling/pain around site- if worsens please see [place] back

## 2018-07-23 NOTE — Assessment & Plan Note (Signed)
GERD-patient compliant with Prilosec 40 mg previously-recommended trial 20 mg- he failed this. Might try again with weight loss when/if occurs in future

## 2018-07-23 NOTE — Assessment & Plan Note (Signed)
S: Depression was reasonably controlled on Celexa 20 mg but he has not taken this in years and continues to do well-  PHQ 9 of 3 today. A/P: Stable-continue current medications. He thinks work stress being better has helped- had some prior family issues as well

## 2018-08-18 ENCOUNTER — Other Ambulatory Visit: Payer: Self-pay | Admitting: Family Medicine

## 2018-08-24 ENCOUNTER — Encounter

## 2018-08-24 ENCOUNTER — Encounter: Payer: BLUE CROSS/BLUE SHIELD | Admitting: Family Medicine

## 2018-10-14 ENCOUNTER — Telehealth: Payer: Self-pay | Admitting: Family Medicine

## 2018-10-14 NOTE — Telephone Encounter (Signed)
Copied from [place] [phone]. Topic: Medical Record Request - Patient ROI Request >> Oct 14, 2018 10:34 AM Scherrie Gerlach wrote: Pt returning call from [facility]. Pt would like a copy of the office visit with Dr Yong Channel DOS 07/23/18 .  Pt would like to send this for insurance purposes.  Pt will come to the office tomorrow and sign release for that information.

## 2018-10-14 NOTE — Telephone Encounter (Signed)
Please advise.   Copied from [place] [id number]. Topic: Medical Record Request - Patient ROI Request >> Oct 14, 2018 10:34 AM Scherrie Gerlach wrote: Pt returning call from [facility]. Pt would like a copy of the office visit with Dr Yong Channel DOS 07/23/18 .  Pt would like to send this for insurance purposes.  Pt will come to the office tomorrow and sign release for that information.

## 2018-11-10 ENCOUNTER — Encounter: Payer: Self-pay | Admitting: Family Medicine

## 2018-12-20 ENCOUNTER — Encounter: Payer: Self-pay | Admitting: Gastroenterology

## 2018-12-27 ENCOUNTER — Other Ambulatory Visit: Payer: Self-pay

## 2018-12-27 ENCOUNTER — Encounter: Payer: Self-pay | Admitting: Family Medicine

## 2018-12-27 ENCOUNTER — Telehealth: Payer: Self-pay | Admitting: Gastroenterology

## 2018-12-27 NOTE — Telephone Encounter (Signed)
Pt called and  Wanted to cx his procdure. He stated that he wanted to wait close to the 17yr mark. He advised he will call back to reschd close to that time.

## 2018-12-27 NOTE — Telephone Encounter (Signed)
Noted. Recall in [place].

## 2019-01-10 ENCOUNTER — Encounter: Payer: BLUE CROSS/BLUE SHIELD | Admitting: Gastroenterology

## 2019-04-30 ENCOUNTER — Other Ambulatory Visit: Payer: Self-pay | Admitting: Family Medicine

## 2019-06-01 ENCOUNTER — Other Ambulatory Visit: Payer: Self-pay

## 2019-06-01 ENCOUNTER — Ambulatory Visit (INDEPENDENT_AMBULATORY_CARE_PROVIDER_SITE_OTHER): Payer: BC Managed Care – PPO

## 2019-06-01 DIAGNOSIS — Z23 Encounter for immunization: Secondary | ICD-10-CM

## 2019-07-20 ENCOUNTER — Other Ambulatory Visit: Payer: Self-pay | Admitting: Family Medicine

## 2019-07-25 DIAGNOSIS — Z20828 Contact with and (suspected) exposure to other viral communicable diseases: Secondary | ICD-10-CM | POA: Diagnosis not present

## 2019-07-26 ENCOUNTER — Encounter: Payer: BLUE CROSS/BLUE SHIELD | Admitting: Family Medicine

## 2019-07-28 ENCOUNTER — Encounter: Payer: Self-pay | Admitting: Family Medicine

## 2019-09-12 ENCOUNTER — Other Ambulatory Visit: Payer: Self-pay

## 2019-09-13 ENCOUNTER — Ambulatory Visit (INDEPENDENT_AMBULATORY_CARE_PROVIDER_SITE_OTHER): Payer: BC Managed Care – PPO | Admitting: Family Medicine

## 2019-09-13 ENCOUNTER — Encounter: Payer: Self-pay | Admitting: Family Medicine

## 2019-09-13 VITALS — BP 128/82 | HR 74 | Temp 98.0°F | Ht 70.0 in | Wt 228.6 lb

## 2019-09-13 DIAGNOSIS — F3341 Major depressive disorder, recurrent, in partial remission: Secondary | ICD-10-CM

## 2019-09-13 DIAGNOSIS — J4520 Mild intermittent asthma, uncomplicated: Secondary | ICD-10-CM

## 2019-09-13 DIAGNOSIS — E785 Hyperlipidemia, unspecified: Secondary | ICD-10-CM

## 2019-09-13 DIAGNOSIS — K219 Gastro-esophageal reflux disease without esophagitis: Secondary | ICD-10-CM | POA: Diagnosis not present

## 2019-09-13 DIAGNOSIS — Z125 Encounter for screening for malignant neoplasm of prostate: Secondary | ICD-10-CM | POA: Diagnosis not present

## 2019-09-13 DIAGNOSIS — Z79899 Other long term (current) drug therapy: Secondary | ICD-10-CM | POA: Diagnosis not present

## 2019-09-13 DIAGNOSIS — I10 Essential (primary) hypertension: Secondary | ICD-10-CM | POA: Diagnosis not present

## 2019-09-13 DIAGNOSIS — Z0000 Encounter for general adult medical examination without abnormal findings: Secondary | ICD-10-CM | POA: Diagnosis not present

## 2019-09-13 MED ORDER — OMEPRAZOLE 20 MG PO CPDR: 20.0000 mg | DELAYED_RELEASE_CAPSULE | Freq: Every day | ORAL | 3 refills | Status: AC

## 2019-09-13 MED ORDER — CITALOPRAM HYDROBROMIDE 10 MG PO TABS: 10.0000 mg | ORAL_TABLET | Freq: Every day | ORAL | 3 refills | Status: DC

## 2019-09-13 NOTE — Assessment & Plan Note (Signed)
Gastroesophageal reflux disease without esophagitis-well-controlled on omeprazole 40 mg.  We discussed possible trial of 20 mg and he is going to try- has lost some weight and this may be enough- he can go back up if needed -we will check a b12 level today since he has fatigue and omeprazole can cause low b12

## 2019-09-13 NOTE — Progress Notes (Signed)
Phone [phone] In person visit   Subjective:   Eduardo Turner is a 59 y.o. year old very pleasant male patient who presents for/with See problem oriented charting-worsening depression, stable GERD  This visit occurred during the SARS-CoV-2 public health emergency.  Safety protocols were in place, including screening questions prior to the visit, additional usage of staff PPE, and extensive cleaning of exam room while observing appropriate contact time as indicated for disinfecting solutions.   Past Medical History-  Patient Active Problem List   Diagnosis Date Noted  . Hyperlipidemia 07/21/2016    Priority: Medium  . GERD 09/19/2009    Priority: Medium  . Depression, major, recurrent, in partial remission ([place]) 06/21/2009    Priority: Medium  . Essential hypertension 02/22/2007    Priority: Medium  . Asthma 02/22/2007    Priority: Medium  . Ophthalmic migraine 09/20/2010    Priority: Low  . DYSPLASTIC NEVUS, BACK 02/05/2008    Priority: Low  . Lipoma 01/31/2008    Priority: Low  . ERECTILE DYSFUNCTION, MILD 03/17/2007    Priority: Low  . Allergic rhinitis 02/22/2007    Priority: Low    Medications- reviewed and updated Current Outpatient Medications  Medication Sig Dispense Refill  . ibuprofen (ADVIL,MOTRIN) 200 MG tablet Take 200 mg by mouth as needed.      [name] lisinopril (ZESTRIL) 40 MG tablet TAKE 1 TABLET BY MOUTH  DAILY 90 tablet 3  . loratadine (CLARITIN) 10 MG tablet Take 10 mg by mouth daily.      [name] omeprazole (PRILOSEC) 20 MG capsule Take 1 capsule (20 mg total) by mouth daily. 90 capsule 3  . citalopram (CELEXA) 10 MG tablet Take 1 tablet (10 mg total) by mouth daily. 90 tablet 3   No current facility-administered medications for this visit.     Objective:  BP 128/82   Pulse 74   Temp 98 F (36.7 C)   Ht 5\' 10"  (1.778 m)   Wt 228 lb 9.6 oz (103.7 kg)   SpO2 96%   BMI 32.80 kg/m  Gen: NAD, resting comfortably    Assessment and Plan   #Had  COVID-30 July 2019-has had lingering fatigue since that time.  We agreed to do some work-up with B12 under high risk medication use, TSH under hyperlipidemia to rule out organic cause.  Possible COVID-19 fatigue contributing to depressed mood symptoms as well  Depression, major, recurrent, in partial remission ([place])  Major depressive disorder with single episode, in partial remission (HCC)-prior in full remission.  Had been on Celexa previously but was off as of last visit.  Some depressed mood related to COVID-19-. 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  -depression worsening - has done well on celexa in the past. We are going to restart lower dose at 10 mg since symptoms are on more mild side in regard to depression. He will give me an update in 6 weeks by mychart by filling out phq9 again (team will give him a copy today) and let me know the total Depression screen [facility] 2/9 09/13/2019 07/23/2018  Decreased Interest 0 0  Down, Depressed, Hopeless 2 1  PHQ - 2 Score 2 1  Altered sleeping 3 1  Tired, decreased energy 1 1  Change in appetite 0 0  Feeling bad or failure about yourself  1 0  Trouble concentrating  1 0  Moving slowly or fidgety/restless 0 0  Suicidal thoughts 0 0  PHQ-9 Score 8 3  Difficult doing work/chores Not difficult at all Not difficult at all  - we also considered counseling but he simply does not have time at present  GERD Gastroesophageal reflux disease without esophagitis-well-controlled on omeprazole 40 mg.  We discussed possible trial of 20 mg and he is going to try- has lost some weight and this may be enough- he can go back up if needed -we will check a b12 level today since he has fatigue and omeprazole can cause low b12   Recommended follow up: 6-week MyChart update or sooner if any thoughts of self-harm or worsening  symptoms  Lab/Order associations:   ICD-10-CM   1. Preventative health care  Z00.00 CBC with Differential/Platelet    Comprehensive metabolic panel    Lipid panel    PSA    TSH  2. Depression, major, recurrent, in partial remission ([place])  F33.41   3. Gastroesophageal reflux disease without esophagitis  K21.9   4. Hyperlipidemia, unspecified hyperlipidemia type  E78.5 CBC with Differential/Platelet    Comprehensive metabolic panel    Lipid panel    TSH  5. Essential hypertension  I10   6. Mild intermittent asthma without complication  A999333   7. Screening for prostate cancer  Z12.5 PSA  8. High risk medication use  Z79.899 Vitamin B12    Meds ordered this encounter  Medications  . citalopram (CELEXA) 10 MG tablet    Sig: Take 1 tablet (10 mg total) by mouth daily.    Dispense:  90 tablet    Refill:  3  . omeprazole (PRILOSEC) 20 MG capsule    Sig: Take 1 capsule (20 mg total) by mouth daily.    Dispense:  90 capsule    Refill:  3   Return precautions advised.  Garret Reddish, MD

## 2019-09-13 NOTE — Patient Instructions (Addendum)
-  depression worsening - has done well on celexa in the past. We are going to restart lower dose at 10 mg since symptoms are on more mild side in regard to depression. He will give me an update in 6 weeks by mychart by filling out phq9 again (team will give him a copy today) and let me know the total or send in a picture - also checking labs to make sure no lab based cause of fatigue or depression including thyroid test and b12  Make sure to schedule colonoscopy as right at 10 years at this point  Recommended follow up:  6 months for blood pressure recheck or sooner if needed, if we dont have that visit- definitely 1 year for physical. Definitely sooner if any issues with depression not improving - call us ASAP or seek care if any thoughts of self harm.   Please stop by lab before you go If you do not have mychart- we will call you about results within 5 business days of [place] receiving them.  If you have mychart- we will send your results within 3 business days of [place] receiving them.  If abnormal or we want to clarify a result, we will call or mychart you to make sure you receive the message.  If you have questions or concerns or don't hear within 5-7 days, please send [place] a message or call us.

## 2019-09-13 NOTE — Assessment & Plan Note (Signed)
  Major depressive disorder with single episode, in partial remission (HCC)-prior in full remission.  Had been on Celexa previously but was off as of last visit.  Some depressed mood related to COVID-19-. 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  -depression worsening - has done well on celexa in the past. We are going to restart lower dose at 10 mg since symptoms are on more mild side in regard to depression. He will give me an update in 6 weeks by mychart by filling out phq9 again (team will give him a copy today) and let me know the total Depression screen [facility] 2/9 09/13/2019 07/23/2018  Decreased Interest 0 0  Down, Depressed, Hopeless 2 1  PHQ - 2 Score 2 1  Altered sleeping 3 1  Tired, decreased energy 1 1  Change in appetite 0 0  Feeling bad or failure about yourself  1 0  Trouble concentrating 1 0  Moving slowly or fidgety/restless 0 0  Suicidal thoughts 0 0  PHQ-9 Score 8 3  Difficult doing work/chores Not difficult at all Not difficult at all  - we also considered counseling but he simply does not have time at present

## 2019-09-13 NOTE — Progress Notes (Signed)
Phone: [phone]    Subjective:  Patient presents today for their annual physical. Chief complaint-noted.   See problem oriented charting- ROS- full  review of systems was completed and negative  except for: fatigue since covid in December and even prior to that, light headedness if stands up quickly (trying to stay well hydrated), sad mood, muscle aches in shoulders both sides, anxiety, sleeep issues, decreased concentration, agitation from covid stresses  The following were reviewed and entered/updated in epic: Past Medical History:  Diagnosis Date  . Allergy   . Asthma   . Dysplastic nevi   . GERD (gastroesophageal reflux disease)   . Hypertension   . Multiple lipomas   . Peptic esophageal ulcer    Patient Active Problem List   Diagnosis Date Noted  . Hyperlipidemia 07/21/2016    Priority: Medium  . GERD 09/19/2009    Priority: Medium  . Depression, major, recurrent, in partial remission ([place]) 06/21/2009    Priority: Medium  . Essential hypertension 02/22/2007    Priority: Medium  . Asthma 02/22/2007    Priority: Medium  . Ophthalmic migraine 09/20/2010    Priority: Low  . DYSPLASTIC NEVUS, BACK 02/05/2008    Priority: Low  . Lipoma 01/31/2008    Priority: Low  . ERECTILE DYSFUNCTION, MILD 03/17/2007    Priority: Low  . Allergic rhinitis 02/22/2007    Priority: Low   Past Surgical History:  Procedure Laterality Date  . SHOULDER SURGERY     bilateral    Family History  Problem Relation Age of Onset  . Other Mother        routine surgery lead to sepsis- hysterectomy  . Heart attack Father        age 64  . Bipolar disorder Sister     Medications- reviewed and updated Current Outpatient Medications  Medication Sig Dispense Refill  . ibuprofen (ADVIL,MOTRIN) 200 MG tablet Take 200 mg by mouth as needed.      [name] lisinopril (ZESTRIL) 40 MG tablet TAKE 1 TABLET BY MOUTH  DAILY 90 tablet 3  . loratadine (CLARITIN) 10 MG tablet Take 10 mg by mouth daily.       [name] omeprazole (PRILOSEC) 20 MG capsule Take 1 capsule (20 mg total) by mouth daily. 90 capsule 3  . citalopram (CELEXA) 10 MG tablet Take 1 tablet (10 mg total) by mouth daily. 90 tablet 3   No current facility-administered medications for this visit.    Allergies-reviewed and updated Allergies  Allergen Reactions  . Erythromycin Ethylsuccinate     REACTION: HIVES    Social History   Social History Narrative   Married. 2 children both out of house. Granddaughter 6 and grandson 2 in 09/2019.       Radio broadcast assistant at [employer].       Objective:  BP 128/82   Pulse 74   Temp 98 F (36.7 C)   Ht 5\' 10"  (1.778 m)   Wt 228 lb 9.6 oz (103.7 kg)   SpO2 96%   BMI 32.80 kg/m  Gen: NAD, resting comfortably HEENT: Mask not removed due to covid 19. TM normal. Bridge of nose normal. Eyelids normal.  Neck: no thyromegaly or cervical lymphadenopathy  CV: RRR no murmurs rubs or gallops Lungs: CTAB no crackles, wheeze, rhonchi Abdomen: soft/nontender/nondistended/normal bowel sounds. No rebound or guarding.  Ext: no edema Skin: warm, dry Neuro: grossly normal, moves all extremities, PERRLA    Assessment and Plan:  59 y.o. male presenting for annual physical.  Health Maintenance counseling: 1. Anticipatory guidance: Patient counseled regarding regular dental exams yes q6 months, eye exams yes,  avoiding smoking and second hand smoke , limiting alcohol to 0 beverages per day.   2. Risk factor reduction:  Advised patient of need for regular exercise and diet rich and fruits and vegetables to reduce risk of heart  attack and stroke. Exercise- not as much as he should as discussed 150 minutes a week. Diet-eating at home more and trying to eat healthier diet-weight down 5 pounds from last year  Wt Readings from Last 3 Encounters:  09/13/19 228 lb 9.6 oz (103.7 kg)  07/23/18 233 lb (105.7 kg)  06/23/18 235 lb 4 oz (106.7 kg)  3. Immunizations/screenings/ancillary studies-recommended  COVID-19 vaccination when available. But at least 90 days post infection. Patient declines Shingrix at this time  Immunization History  Administered Date(s) Administered  . Influenza Whole 05/08/2008, 06/21/2009  . Influenza,inj,Quad PF,6+ Mos 05/25/2018, 06/01/2019  . Influenza-Unspecified 06/11/2016  . Td 08/11/2002  . Tdap 10/16/2014  4. Prostate cancer screening- defer rectal exam unless PSA trend concerning-update PSA today with labs Lab Results  Component Value Date   PSA 0.48 07/23/2018   PSA 0.46 07/21/2016   PSA 0.49 10/10/2014   5. Colon cancer screening - November 06, 2009 with 10-year follow-up-discussed patient will be due shortly-he has already received a call and he will set this up with GI 6. Skin cancer screening/prevention-no dermatologist. advised regular sunscreen use. Denies worrisome, changing, or new skin lesions.  7. STD screening- patient opts out as monogamous 8. never smoker  Status of chronic or acute concerns   Hyperlipidemia-patient has wanted to focus on healthy eating/regular exercise.  Father did have heart attack at age 27.  Prior ASCVD risk 10.4%.  We will update lipid panel today and discussed if above 12% strongly consider statin even if just once a week- prefers to minimize meds   Essential hypertension-well-controlled on lisinopril 40 mg  Mild intermittent asthma without complication-has not had to use albuterol in years- he will call if has issues  History of ophthalmologic migraine-followed ophthalmology-minimal recent issues   Recommended follow up:  6 months for blood pressure recheck or sooner if needed, if we dont have that visit- definitely 1 year for physical  Lab/Order associations: fasting   ICD-10-CM   1. Preventative health care  Z00.00 CBC with Differential/Platelet    Comprehensive metabolic panel    Lipid panel    PSA    TSH  4. Hyperlipidemia, unspecified hyperlipidemia type  E78.5 CBC with Differential/Platelet     Comprehensive metabolic panel    Lipid panel    TSH  5. Essential hypertension  I10   6. Mild intermittent asthma without complication  A999333   7. Screening for prostate cancer  Z12.5 PSA  8. High risk medication use  Z79.899 Vitamin B12   Return precautions advised.   Garret Reddish, MD

## 2019-09-14 LAB — COMPREHENSIVE METABOLIC PANEL
ALT: 36 U/L (ref 0–53)
AST: 28 U/L (ref 0–37)
Albumin: 4.7 g/dL (ref 3.5–5.2)
Alkaline Phosphatase: 61 U/L (ref 39–117)
BUN: 18 mg/dL (ref 6–23)
CO2: 27 mEq/L (ref 19–32)
Calcium: 9.6 mg/dL (ref 8.4–10.5)
Chloride: 101 mEq/L (ref 96–112)
Creatinine, Ser: 0.83 mg/dL (ref 0.40–1.50)
GFR: 95.44 mL/min (ref >60.00)
Glucose, Bld: 77 mg/dL (ref 70–99)
Potassium: 3.8 mEq/L (ref 3.5–5.1)
Sodium: 138 mEq/L (ref 135–145)
Total Bilirubin: 1.6 mg/dL — ABNORMAL HIGH (ref 0.2–1.2)
Total Protein: 7.3 g/dL (ref 6.0–8.3)

## 2019-09-14 LAB — CBC WITH DIFFERENTIAL/PLATELET
Basophils Absolute: 0.1 10*3/uL (ref 0.0–0.1)
Basophils Relative: 1.2 % (ref 0.0–3.0)
Eosinophils Absolute: 0.2 10*3/uL (ref 0.0–0.7)
Eosinophils Relative: 1.8 % (ref 0.0–5.0)
HCT: 44.9 % (ref 39.0–52.0)
Hemoglobin: 15.2 g/dL (ref 13.0–17.0)
Lymphocytes Relative: 23.5 % (ref 12.0–46.0)
Lymphs Abs: 2 10*3/uL (ref 0.7–4.0)
MCHC: 34 g/dL (ref 30.0–36.0)
MCV: 86.9 fl (ref 78.0–100.0)
Monocytes Absolute: 0.5 10*3/uL (ref 0.1–1.0)
Monocytes Relative: 5.8 % (ref 3.0–12.0)
Neutro Abs: 5.8 10*3/uL (ref 1.4–7.7)
Neutrophils Relative %: 67.7 % (ref 43.0–77.0)
Platelets: 232 10*3/uL (ref 150.0–400.0)
RBC: 5.17 Mil/uL (ref 4.22–5.81)
RDW: 13.1 % (ref 11.5–15.5)
WBC: 8.6 10*3/uL (ref 4.0–10.5)

## 2019-09-14 LAB — LIPID PANEL
Cholesterol: 246 mg/dL — ABNORMAL HIGH (ref 0–200)
HDL: 44.6 mg/dL (ref >39.00)
LDL Cholesterol: 178 mg/dL — ABNORMAL HIGH (ref 0–99)
NonHDL: 201.26
Total CHOL/HDL Ratio: 6
Triglycerides: 114 mg/dL (ref 0.0–149.0)
VLDL: 22.8 mg/dL (ref 0.0–40.0)

## 2019-09-14 LAB — VITAMIN B12: Vitamin B-12: 457 pg/mL (ref 211–911)

## 2019-09-14 LAB — TSH: TSH: 0.76 u[IU]/mL (ref 0.35–4.50)

## 2019-09-14 LAB — PSA: PSA: 2.02 ng/mL (ref 0.10–4.00)

## 2019-09-16 ENCOUNTER — Other Ambulatory Visit: Payer: Self-pay

## 2019-09-16 DIAGNOSIS — R972 Elevated prostate specific antigen [PSA]: Secondary | ICD-10-CM

## 2019-10-11 ENCOUNTER — Ambulatory Visit (INDEPENDENT_AMBULATORY_CARE_PROVIDER_SITE_OTHER): Payer: BC Managed Care – PPO | Admitting: Family Medicine

## 2019-10-11 ENCOUNTER — Encounter: Payer: Self-pay | Admitting: Family Medicine

## 2019-10-11 ENCOUNTER — Other Ambulatory Visit: Payer: Self-pay

## 2019-10-11 VITALS — BP 130/82 | HR 67 | Temp 98.1°F | Ht 70.0 in | Wt 233.2 lb

## 2019-10-11 DIAGNOSIS — K219 Gastro-esophageal reflux disease without esophagitis: Secondary | ICD-10-CM

## 2019-10-11 DIAGNOSIS — F3341 Major depressive disorder, recurrent, in partial remission: Secondary | ICD-10-CM

## 2019-10-11 DIAGNOSIS — F3342 Major depressive disorder, recurrent, in full remission: Secondary | ICD-10-CM

## 2019-10-11 DIAGNOSIS — R972 Elevated prostate specific antigen [PSA]: Secondary | ICD-10-CM

## 2019-10-11 LAB — PSA: PSA: 0.67 ng/mL (ref 0.10–4.00)

## 2019-10-11 NOTE — Assessment & Plan Note (Signed)
S:Changed to Omeprazole 20mg  from 40mg  at last visit. He feels that the 20mg  is helping will today.  A/P: Good control despite reduction and omeprazole to 20 mg-Will stay on that dose/continue current medications. And will let us know if any change in symptoms.

## 2019-10-11 NOTE — Progress Notes (Signed)
Really great news-PSA significantly decreased over the last month back down to 0.67 which would be within the normal expected increase over a years time.  You may cancel your urology visit and let us plan on repeating PSA next year

## 2019-10-11 NOTE — Patient Instructions (Addendum)
Please stop by lab before you go If you do not have mychart- we will call you about results within 5 business days of [place] receiving them.  If you have mychart- we will send your results within 3 business days of [place] receiving them.  If abnormal or we want to clarify a result, we will call or mychart you to make sure you receive the message.  If you have questions or concerns or don't hear within 5 business days, please send [place] a message or call us.   Continue current medicines- glad you are doing so well all things considered!   Recommended follow up: make sure to schedule a yearly physical- 1 year out from your last before you leave. If your blood pressure trends up or weight trends up happy to see you sooner

## 2019-10-11 NOTE — Progress Notes (Signed)
Phone [phone] In person visit   Subjective:   Eduardo Turner is a 59 y.o. year old very pleasant male patient who presents for/with See problem oriented charting Chief Complaint  Patient presents with  . Depression  . Gastroesophageal Reflux   This visit occurred during the SARS-CoV-2 public health emergency.  Safety protocols were in place, including screening questions prior to the visit, additional usage of staff PPE, and extensive cleaning of exam room while observing appropriate contact time as indicated for disinfecting solutions.   Past Medical History-  Patient Active Problem List   Diagnosis Date Noted  . Hyperlipidemia 07/21/2016    Priority: Medium  . GERD 09/19/2009    Priority: Medium  . Recurrent major depression in full remission ([place]) 06/21/2009    Priority: Medium  . Essential hypertension 02/22/2007    Priority: Medium  . Asthma 02/22/2007    Priority: Medium  . Ophthalmic migraine 09/20/2010    Priority: Low  . DYSPLASTIC NEVUS, BACK 02/05/2008    Priority: Low  . Lipoma 01/31/2008    Priority: Low  . ERECTILE DYSFUNCTION, MILD 03/17/2007    Priority: Low  . Allergic rhinitis 02/22/2007    Priority: Low    Medications- reviewed and updated Current Outpatient Medications  Medication Sig Dispense Refill  . citalopram (CELEXA) 10 MG tablet Take 1 tablet (10 mg total) by mouth daily. 90 tablet 3  . ibuprofen (ADVIL,MOTRIN) 200 MG tablet Take 200 mg by mouth as needed.      [name] lisinopril (ZESTRIL) 40 MG tablet TAKE 1 TABLET BY MOUTH  DAILY 90 tablet 3  . loratadine (CLARITIN) 10 MG tablet Take 10 mg by mouth daily.      [name] omeprazole (PRILOSEC) 20 MG capsule Take 1 capsule (20 mg total) by mouth daily. 90 capsule 3   No current facility-administered medications for this visit.     Objective:  BP 130/82   Pulse 67   Temp 98.1 F (36.7 C)   Ht 5\' 10"  (1.778 m)   Wt 233 lb 3.2 oz (105.8 kg)   SpO2 96%   BMI 33.46 kg/m  Gen: NAD, resting  comfortably CV: RRR no murmurs rubs or gallops Lungs: CTAB no crackles, wheeze, rhonchi Ext: no edema Skin: warm, dry    Assessment and Plan  # Depression, major, recurrent, in full remission S:At last visit restarted Celexa at 10mg -she had been on this in the past for depression but later came off. Has had improvement with symptoms from last visit. Not suicidal thoughts.   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  His stress levels in regards to his daughter who works in a medical office are better with pandemic improving.  He is trying to spend as much time as possible with family and in the past has helped his other daughter with 2 children ages 44 and 46. Depression screen [facility] 2/9 10/11/2019 09/13/2019 07/23/2018  Decreased Interest 0 0 0  Down, Depressed, Hopeless 0 2 1  PHQ - 2 Score 0 2 1  Altered sleeping 2 3 1   Tired, decreased energy 1 1 1   Change in appetite 0 0 0  Feeling bad or failure about yourself  1 1 0  Trouble concentrating - 1 0  Moving slowly or fidgety/restless 0 0 0  Suicidal thoughts 0 0 0  PHQ-9 Score 4 8 3   Difficult doing work/chores Somewhat difficult Not difficult at all Not  difficult at all  A/P: Reasonable control with PHQ-9 under 5 with no anhedonia or depressed mood -continue citalopram 10 mg.  If symptoms worsen in the future could increase to 20 mg.  # GERD  S:Changed to Omeprazole 20mg  from 40mg  at last visit. He feels that the 20mg  is helping will today.  A/P: Good control despite reduction and omeprazole to 20 mg-Will stay on that dose/continue current medications. And will let us know if any change in symptoms.   #Elevated PSA-Monday visit with urology  Lab Results  Component Value Date   PSA 2.02 09/13/2019   PSA 0.48 07/23/2018   PSA 0.46 07/21/2016  We discussed occasionally there are sporadic increases in PSA-he would like to cancel urology visit if PSA returns to prior lower value  which I think is reasonable and we could return to yearly checks at that point  Recommended follow up: Recommended he go ahead and schedule at least an annual physical Future Appointments  Date Time Provider [place]  10/17/2019  3:30 PM Abbie Sons, MD [facility] None  09/19/2020  9:20 AM Marin Olp, MD [facility]    Lab/Order associations:   ICD-10-CM   1. Recurrent major depression in full remission ([place])  F33.42   2. Gastroesophageal reflux disease without esophagitis  K21.9   3. Elevated PSA  R97.20 PSA   Return precautions advised.  Garret Reddish, MD

## 2019-10-17 ENCOUNTER — Ambulatory Visit: Payer: Self-pay | Admitting: Urology

## 2019-10-18 ENCOUNTER — Ambulatory Visit: Payer: BC Managed Care – PPO | Admitting: Family Medicine

## 2019-10-30 DIAGNOSIS — I639 Cerebral infarction, unspecified: Secondary | ICD-10-CM | POA: Diagnosis not present

## 2019-10-30 DIAGNOSIS — G459 Transient cerebral ischemic attack, unspecified: Secondary | ICD-10-CM | POA: Diagnosis not present

## 2019-10-30 DIAGNOSIS — R531 Weakness: Secondary | ICD-10-CM | POA: Diagnosis not present

## 2019-10-30 DIAGNOSIS — R4789 Other speech disturbances: Secondary | ICD-10-CM | POA: Diagnosis not present

## 2019-10-30 DIAGNOSIS — F329 Major depressive disorder, single episode, unspecified: Secondary | ICD-10-CM | POA: Diagnosis not present

## 2019-10-30 DIAGNOSIS — I10 Essential (primary) hypertension: Secondary | ICD-10-CM | POA: Diagnosis not present

## 2019-10-30 DIAGNOSIS — G8194 Hemiplegia, unspecified affecting left nondominant side: Secondary | ICD-10-CM | POA: Diagnosis not present

## 2019-10-30 DIAGNOSIS — I619 Nontraumatic intracerebral hemorrhage, unspecified: Secondary | ICD-10-CM | POA: Diagnosis not present

## 2019-10-30 DIAGNOSIS — I6782 Cerebral ischemia: Secondary | ICD-10-CM | POA: Diagnosis not present

## 2019-10-30 DIAGNOSIS — R202 Paresthesia of skin: Secondary | ICD-10-CM | POA: Diagnosis not present

## 2019-10-30 DIAGNOSIS — R471 Dysarthria and anarthria: Secondary | ICD-10-CM | POA: Diagnosis not present

## 2019-10-30 DIAGNOSIS — J984 Other disorders of lung: Secondary | ICD-10-CM | POA: Diagnosis not present

## 2019-10-30 DIAGNOSIS — Z8719 Personal history of other diseases of the digestive system: Secondary | ICD-10-CM | POA: Diagnosis not present

## 2019-10-30 DIAGNOSIS — J986 Disorders of diaphragm: Secondary | ICD-10-CM | POA: Diagnosis not present

## 2019-10-30 DIAGNOSIS — J45909 Unspecified asthma, uncomplicated: Secondary | ICD-10-CM | POA: Diagnosis not present

## 2019-10-30 HISTORY — DX: Cerebral infarction, unspecified: I63.9

## 2019-10-31 DIAGNOSIS — R202 Paresthesia of skin: Secondary | ICD-10-CM | POA: Diagnosis not present

## 2019-10-31 DIAGNOSIS — I517 Cardiomegaly: Secondary | ICD-10-CM | POA: Diagnosis not present

## 2019-10-31 DIAGNOSIS — I071 Rheumatic tricuspid insufficiency: Secondary | ICD-10-CM | POA: Diagnosis not present

## 2019-10-31 DIAGNOSIS — I371 Nonrheumatic pulmonary valve insufficiency: Secondary | ICD-10-CM | POA: Diagnosis not present

## 2019-10-31 DIAGNOSIS — I639 Cerebral infarction, unspecified: Secondary | ICD-10-CM | POA: Insufficient documentation

## 2019-10-31 DIAGNOSIS — R4789 Other speech disturbances: Secondary | ICD-10-CM | POA: Diagnosis not present

## 2019-11-01 MED ORDER — ENOXAPARIN SODIUM 40 MG/0.4ML ~~LOC~~ SOLN
40.00 | SUBCUTANEOUS | Status: DC
Start: ? — End: 2019-11-01

## 2019-11-01 MED ORDER — LABETALOL HCL 5 MG/ML IV SOLN
10.00 | INTRAVENOUS | Status: DC
Start: ? — End: 2019-11-01

## 2019-11-01 MED ORDER — SODIUM CHLORIDE 0.9 % IV SOLN
10.00 | INTRAVENOUS | Status: DC
Start: ? — End: 2019-11-01

## 2019-11-01 MED ORDER — ATORVASTATIN CALCIUM 40 MG PO TABS
40.00 | ORAL_TABLET | ORAL | Status: DC
Start: ? — End: 2019-11-01

## 2019-11-01 MED ORDER — ASPIRIN 81 MG PO CHEW
81.00 | CHEWABLE_TABLET | ORAL | Status: DC
Start: 2019-10-31 — End: 2019-11-01

## 2019-11-01 MED ORDER — GENERIC EXTERNAL MEDICATION
Status: DC
Start: ? — End: 2019-11-01

## 2019-11-01 MED ORDER — ALUM & MAG HYDROXIDE-SIMETH 200-200-20 MG/5ML PO SUSP
30.00 | ORAL | Status: DC
Start: ? — End: 2019-11-01

## 2019-11-01 MED ORDER — DSS 100 MG PO CAPS
100.00 | ORAL_CAPSULE | ORAL | Status: DC
Start: 2019-10-31 — End: 2019-11-01

## 2019-11-01 MED ORDER — CALCIUM CARBONATE 1250 (500 CA) MG PO CHEW
1000.00 | CHEWABLE_TABLET | ORAL | Status: DC
Start: ? — End: 2019-11-01

## 2019-11-01 MED ORDER — ONDANSETRON HCL 4 MG/2ML IJ SOLN
4.00 | INTRAMUSCULAR | Status: DC
Start: ? — End: 2019-11-01

## 2019-11-02 ENCOUNTER — Telehealth: Payer: Self-pay | Admitting: Family Medicine

## 2019-11-02 NOTE — Telephone Encounter (Signed)
Pt called stating he was told he needs to schedule a hospital f/u. There is no availability on your schedule for 2 weeks. Is there a date/time you would be able to see him soon?

## 2019-11-02 NOTE — Telephone Encounter (Signed)
Why is my Monday 8 40 on hold? Could that be used?  If not would be willing to do 11 40 on Monday morning work in

## 2019-11-03 NOTE — Telephone Encounter (Signed)
Scheduled appt for pt to be seen on 3/29 at 8:40 am

## 2019-11-07 ENCOUNTER — Ambulatory Visit (INDEPENDENT_AMBULATORY_CARE_PROVIDER_SITE_OTHER): Payer: BC Managed Care – PPO | Admitting: Family Medicine

## 2019-11-07 ENCOUNTER — Other Ambulatory Visit: Payer: Self-pay

## 2019-11-07 ENCOUNTER — Encounter: Payer: Self-pay | Admitting: Family Medicine

## 2019-11-07 VITALS — BP 118/82 | HR 72 | Temp 97.6°F | Ht 70.0 in | Wt 231.6 lb

## 2019-11-07 DIAGNOSIS — E785 Hyperlipidemia, unspecified: Secondary | ICD-10-CM

## 2019-11-07 DIAGNOSIS — I10 Essential (primary) hypertension: Secondary | ICD-10-CM | POA: Diagnosis not present

## 2019-11-07 DIAGNOSIS — Z8673 Personal history of transient ischemic attack (TIA), and cerebral infarction without residual deficits: Secondary | ICD-10-CM | POA: Diagnosis not present

## 2019-11-07 MED ORDER — ATORVASTATIN CALCIUM 40 MG PO TABS: 40.0000 mg | ORAL_TABLET | Freq: Every day | ORAL | 3 refills | Status: DC

## 2019-11-07 NOTE — Progress Notes (Signed)
Phone [phone] In person visit   Subjective:   Eduardo Turner is a 59 y.o. year old very pleasant male patient who presents for/with See problem oriented charting Chief Complaint  Patient presents with  . Follow-up   This visit occurred during the SARS-CoV-2 public health emergency.  Safety protocols were in place, including screening questions prior to the visit, additional usage of staff PPE, and extensive cleaning of exam room while observing appropriate contact time as indicated for disinfecting solutions.   Past Medical History-  Patient Active Problem List   Diagnosis Date Noted  . Hyperlipidemia 07/21/2016    Priority: Medium  . GERD 09/19/2009    Priority: Medium  . Recurrent major depression in full remission ([place]) 06/21/2009    Priority: Medium  . Essential hypertension 02/22/2007    Priority: Medium  . Asthma 02/22/2007    Priority: Medium  . Ophthalmic migraine 09/20/2010    Priority: Low  . DYSPLASTIC NEVUS, BACK 02/05/2008    Priority: Low  . Lipoma 01/31/2008    Priority: Low  . ERECTILE DYSFUNCTION, MILD 03/17/2007    Priority: Low  . Allergic rhinitis 02/22/2007    Priority: Low  . History of stroke 11/07/2019  . Cerebrovascular accident (CVA) ([place]) 10/31/2019    Medications- reviewed and updated Current Outpatient Medications  Medication Sig Dispense Refill  . aspirin 81 MG chewable tablet Chew by mouth.    [name] atorvastatin (LIPITOR) 40 MG tablet Take 1 tablet (40 mg total) by mouth daily. 90 tablet 3  . citalopram (CELEXA) 10 MG tablet Take 1 tablet (10 mg total) by mouth daily. 90 tablet 3  . lisinopril (ZESTRIL) 40 MG tablet TAKE 1 TABLET BY MOUTH  DAILY 90 tablet 3  . loratadine (CLARITIN) 10 MG tablet Take 10 mg by mouth daily.      [name] omeprazole (PRILOSEC) 20 MG capsule Take 1 capsule (20 mg total) by mouth daily. 90 capsule 3   No current facility-administered medications for this visit.     Objective:  BP 118/82   Pulse 72   Temp 97.6  F (36.4 C) (Temporal)   Ht 5\' 10"  (1.778 m)   Wt 231 lb 9.6 oz (105.1 kg)   SpO2 96%   BMI 33.23 kg/m  Gen: NAD, resting comfortably No carotid bruits CV: RRR no murmurs rubs or gallops Lungs: CTAB no crackles, wheeze, rhonchi Ext: no edema Skin: warm, dry  Neuro: CN II-XII intact, sensation and reflexes normal throughout, 5/5 muscle strength in bilateral upper and lower extremities. Normal finger to nose. Normal rapid alternating movements. No pronator drift. Normal romberg. Normal gait.      Assessment and Plan  #History of stroke S:Per ED note on 10/30/2019: " MRI positive TIA: I. Left arm and face paresthesia and dysarthria. II. CT head 10/30/2019, no acute pathology. III. CTA head and neck 10/30/2019, no large vessel occlusion, stenosis, aneurysm or dissection. IV. Subacute linear nonhemorrhagic infarct, posterior right frontal lobe near convexity (postcentral gyrus). V. TTE pending. VI. LDL 136 and hemoglobin A1c 5.4. VII. Aspirin nave. "  Patient was cared for at [facility].  He was started on aspirin as well as atorvastatin 40 mg.  Originally presented with paresthesia in left hand spreading the left arm and left face that started at work followed by dysarthria-fortunately symptoms resolved within an hour. He states had some left facial weakness as well.  No prior stroke or TIA.  Did have history of migraine with aura years  ago.  As above head CT was reassuring.  TPA was not given as presented outside of TPA window and symptoms have resolved within an hour.Noted to have subacute linear nonhemorrhagic infarct of the posterior right frontal lobe.  Hemoglobin A1c was not elevated at 5.4. Echo was also reviewed today- had not been finalized in discharge summary- 55% EF, mild tricuspid and pulmonic valve regurgitation. No interstitial shunt.   Only lingering symptoms he has noted is sometimes has trouble finding there right word and can get more frustrated as a  result.  Also feels like he gets more tired than usual.  A/P: 59 year old male with recent stroke-lingering word finding difficulties-but per [facility] MRI positive TIA who presented with left extremity and face paresthesias and dysarthria which has resolved.  He has been started on aspirin as he was aspirin nave.  He also was started on statin-LDL goal under 70-we will have him follow-up in 6 to 8 weeks on atorvastatin 40 mg check on progress-also check CBC given starting aspirin as well as CMP given high-dose statin being started -Also discussed healthy eating/regular exercise needed as she is already made some changes post stroke and feels like he is making progress  #hyperlipidemia S: compliant with recently started atorvastatin 40mg  [facility] after TIA Lab Results  Component Value Date   CHOL 246 (H) 09/13/2019   HDL 44.60 09/13/2019   LDLCALC 178 (H) 09/13/2019   LDLDIRECT 137.8 03/12/2011   TRIG 114.0 09/13/2019   CHOLHDL 6 09/13/2019   A/P: Hopefully improving control-LDL target under 70-6 to 8-week recheck and titrate up if needed to goal.  Also continue efforts for weight loss  #hypertension S: compliant with lisinopril 40 mg  Home cuff reasonably accurate BP Readings from Last 3 Encounters:  11/07/19 118/82  10/11/19 130/82  09/13/19 128/82  A/P: Excellent control once again today-continue current medications.  Thankfully we were able to validate home cuff   #Colonoscopy-encouraged patient to call to schedule  Recommended follow up: Return in about 6 months (around 05/09/2020) for follow up- or sooner if needed. in addition to prior cpe Future Appointments  Date Time Provider [place]  09/25/2020 10:40 AM Marin Olp, MD [facility]    Lab/Order associations:   ICD-10-CM   1. Hyperlipidemia, unspecified hyperlipidemia type  E78.5 CBC with Differential/Platelet    Comprehensive metabolic panel    Lipid panel  2. History of stroke  Z51.73      Meds ordered this encounter  Medications  . atorvastatin (LIPITOR) 40 MG tablet    Sig: Take 1 tablet (40 mg total) by mouth daily.    Dispense:  90 tablet    Refill:  3  Sent in the mail order  Return precautions advised.  Garret Reddish, MD

## 2019-11-07 NOTE — Assessment & Plan Note (Signed)
S: compliant with recently started atorvastatin 40mg  [facility] after TIA Lab Results  Component Value Date   CHOL 246 (H) 09/13/2019   HDL 44.60 09/13/2019   LDLCALC 178 (H) 09/13/2019   LDLDIRECT 137.8 03/12/2011   TRIG 114.0 09/13/2019   CHOLHDL 6 09/13/2019   A/P: Hopefully improving control-LDL target under 70-6 to 8-week recheck and titrate up if needed to goal.  Also continue efforts for weight loss

## 2019-11-07 NOTE — Assessment & Plan Note (Signed)
S:Per ED note on 10/30/2019: " MRI positive TIA: I. Left arm and face paresthesia and dysarthria. II. CT head 10/30/2019, no acute pathology. III. CTA head and neck 10/30/2019, no large vessel occlusion, stenosis, aneurysm or dissection. IV. Subacute linear nonhemorrhagic infarct, posterior right frontal lobe near convexity (postcentral gyrus). V. TTE pending. VI. LDL 136 and hemoglobin A1c 5.4. VII. Aspirin nave. "  Patient was cared for at [facility].  He was started on aspirin as well as atorvastatin 40 mg.  Originally presented with paresthesia in left hand spreading the left arm and left face that started at work followed by dysarthria-fortunately symptoms resolved within an hour. He states had some left facial weakness as well.  No prior stroke or TIA.  Did have history of migraine with aura years ago.  As above head CT was reassuring.  TPA was not given as presented outside of TPA window and symptoms have resolved within an hour.Noted to have subacute linear nonhemorrhagic infarct of the posterior right frontal lobe.  Hemoglobin A1c was not elevated at 5.4. Echo was also reviewed today- had not been finalized in discharge summary- 55% EF, mild tricuspid and pulmonic valve regurgitation. No interstitial shunt.   Only lingering symptoms he has noted is sometimes has trouble finding there right word and can get more frustrated as a result.  Also feels like he gets more tired than usual.  A/P: 59 year old male with recent stroke-lingering word finding difficulties-but per [facility] MRI positive TIA who presented with left extremity and face paresthesias and dysarthria which has resolved.  He has been started on aspirin as he was aspirin nave.  He also was started on statin-LDL goal under 70-we will have him follow-up in 6 to 8 weeks on atorvastatin 40 mg check on progress-also check CBC given starting aspirin as well as CMP given high-dose statin being started -Also discussed  healthy eating/regular exercise needed as she is already made some changes post stroke and feels like he is making progress

## 2019-11-07 NOTE — Patient Instructions (Addendum)
Health Maintenance Due  Topic Date Due  . COLONOSCOPY  Will be making appointment soon  11/07/2019   For pain- would use tylenol/acetaminophen now or voltaren/diclofenac gel  Continue aspirin and atorvastatin for stroke prevention  Continue lisinopril for blood pressure but also helps prevent strokes  Schedule a lab visit at the check out desk  In 6-8 weeks. Return for future fasting labs meaning nothing but water after midnight please. Ok to take your medications with water.   Recommended follow up: Return in about 6 months (around 05/09/2020) for follow up- or sooner if needed.

## 2019-11-07 NOTE — Assessment & Plan Note (Signed)
S: compliant with lisinopril 40 mg  Home cuff reasonably accurate BP Readings from Last 3 Encounters:  11/07/19 118/82  10/11/19 130/82  09/13/19 128/82  A/P: Excellent control once again today-continue current medications.  Thankfully we were able to validate home cuff

## 2019-11-08 DIAGNOSIS — I639 Cerebral infarction, unspecified: Secondary | ICD-10-CM | POA: Diagnosis not present

## 2019-11-08 DIAGNOSIS — E7800 Pure hypercholesterolemia, unspecified: Secondary | ICD-10-CM | POA: Diagnosis not present

## 2019-11-08 DIAGNOSIS — R002 Palpitations: Secondary | ICD-10-CM | POA: Diagnosis not present

## 2019-11-08 DIAGNOSIS — I10 Essential (primary) hypertension: Secondary | ICD-10-CM | POA: Diagnosis not present

## 2019-11-21 DIAGNOSIS — I10 Essential (primary) hypertension: Secondary | ICD-10-CM | POA: Diagnosis not present

## 2019-11-21 DIAGNOSIS — E7800 Pure hypercholesterolemia, unspecified: Secondary | ICD-10-CM | POA: Diagnosis not present

## 2019-11-21 DIAGNOSIS — I639 Cerebral infarction, unspecified: Secondary | ICD-10-CM | POA: Diagnosis not present

## 2019-11-21 DIAGNOSIS — R002 Palpitations: Secondary | ICD-10-CM | POA: Diagnosis not present

## 2019-12-02 ENCOUNTER — Ambulatory Visit: Payer: BC Managed Care – PPO | Attending: Internal Medicine

## 2019-12-02 DIAGNOSIS — Z23 Encounter for immunization: Secondary | ICD-10-CM

## 2019-12-02 NOTE — Progress Notes (Signed)
   Covid-19 Vaccination Clinic  Name:  Eduardo Turner    MRN: [id number] DOB: 1961/02/24  12/02/2019  Eduardo Turner was observed post Covid-19 immunization for 15 minutes without incident. He was provided with Vaccine Information Sheet and instruction to access the V-Safe system.   Eduardo Turner was instructed to call 911 with any severe reactions post vaccine: [name] Difficulty breathing  . Swelling of face and throat  . A fast heartbeat  . A bad rash all over body  . Dizziness and weakness   Immunizations Administered    Name Date Dose VIS Date Route   Pfizer COVID-19 Vaccine 12/02/2019 12:31 PM 0.3 mL 10/05/2018 Intramuscular   Manufacturer: [place]   Lot: [id number]   [place]: [id number]

## 2019-12-06 DIAGNOSIS — I639 Cerebral infarction, unspecified: Secondary | ICD-10-CM | POA: Diagnosis not present

## 2019-12-19 ENCOUNTER — Other Ambulatory Visit: Payer: Self-pay

## 2019-12-19 ENCOUNTER — Other Ambulatory Visit (INDEPENDENT_AMBULATORY_CARE_PROVIDER_SITE_OTHER): Payer: BC Managed Care – PPO

## 2019-12-19 DIAGNOSIS — E785 Hyperlipidemia, unspecified: Secondary | ICD-10-CM

## 2019-12-19 LAB — COMPREHENSIVE METABOLIC PANEL
ALT: 31 U/L (ref 0–53)
AST: 22 U/L (ref 0–37)
Albumin: 4.5 g/dL (ref 3.5–5.2)
Alkaline Phosphatase: 64 U/L (ref 39–117)
BUN: 25 mg/dL — ABNORMAL HIGH (ref 6–23)
CO2: 30 mEq/L (ref 19–32)
Calcium: 9 mg/dL (ref 8.4–10.5)
Chloride: 102 mEq/L (ref 96–112)
Creatinine, Ser: 0.8 mg/dL (ref 0.40–1.50)
GFR: 99.49 mL/min (ref >60.00)
Glucose, Bld: 100 mg/dL — ABNORMAL HIGH (ref 70–99)
Potassium: 3.9 mEq/L (ref 3.5–5.1)
Sodium: 138 mEq/L (ref 135–145)
Total Bilirubin: 1.4 mg/dL — ABNORMAL HIGH (ref 0.2–1.2)
Total Protein: 7 g/dL (ref 6.0–8.3)

## 2019-12-19 LAB — LIPID PANEL
Cholesterol: 142 mg/dL (ref 0–200)
HDL: 35.8 mg/dL — ABNORMAL LOW (ref >39.00)
LDL Cholesterol: 83 mg/dL (ref 0–99)
NonHDL: 106.16
Total CHOL/HDL Ratio: 4
Triglycerides: 116 mg/dL (ref 0.0–149.0)
VLDL: 23.2 mg/dL (ref 0.0–40.0)

## 2019-12-19 LAB — CBC WITH DIFFERENTIAL/PLATELET
Basophils Absolute: 0 10*3/uL (ref 0.0–0.1)
Basophils Relative: 0.6 % (ref 0.0–3.0)
Eosinophils Absolute: 0.3 10*3/uL (ref 0.0–0.7)
Eosinophils Relative: 5.2 % — ABNORMAL HIGH (ref 0.0–5.0)
HCT: 45.5 % (ref 39.0–52.0)
Hemoglobin: 15.5 g/dL (ref 13.0–17.0)
Lymphocytes Relative: 33.3 % (ref 12.0–46.0)
Lymphs Abs: 2 10*3/uL (ref 0.7–4.0)
MCHC: 34.2 g/dL (ref 30.0–36.0)
MCV: 86.3 fl (ref 78.0–100.0)
Monocytes Absolute: 0.4 10*3/uL (ref 0.1–1.0)
Monocytes Relative: 6.8 % (ref 3.0–12.0)
Neutro Abs: 3.2 10*3/uL (ref 1.4–7.7)
Neutrophils Relative %: 54.1 % (ref 43.0–77.0)
Platelets: 184 10*3/uL (ref 150.0–400.0)
RBC: 5.27 Mil/uL (ref 4.22–5.81)
RDW: 13.1 % (ref 11.5–15.5)
WBC: 6 10*3/uL (ref 4.0–10.5)

## 2019-12-19 MED ORDER — ROSUVASTATIN CALCIUM 40 MG PO TABS: 40.0000 mg | ORAL_TABLET | Freq: Every day | ORAL | 3 refills | Status: DC

## 2019-12-20 ENCOUNTER — Other Ambulatory Visit: Payer: BC Managed Care – PPO

## 2019-12-23 DIAGNOSIS — E7800 Pure hypercholesterolemia, unspecified: Secondary | ICD-10-CM | POA: Diagnosis not present

## 2019-12-23 DIAGNOSIS — R002 Palpitations: Secondary | ICD-10-CM | POA: Diagnosis not present

## 2019-12-23 DIAGNOSIS — I10 Essential (primary) hypertension: Secondary | ICD-10-CM | POA: Diagnosis not present

## 2019-12-23 DIAGNOSIS — I639 Cerebral infarction, unspecified: Secondary | ICD-10-CM | POA: Diagnosis not present

## 2019-12-26 ENCOUNTER — Ambulatory Visit: Payer: BC Managed Care – PPO | Attending: Internal Medicine

## 2019-12-26 DIAGNOSIS — Z23 Encounter for immunization: Secondary | ICD-10-CM

## 2019-12-26 NOTE — Progress Notes (Signed)
   Covid-19 Vaccination Clinic  Name:  Eduardo Turner    MRN: [id number] DOB: 04-10-1961  12/26/2019  Mr. Eduardo Turner was observed post Covid-19 immunization for 15 minutes without incident. He was provided with Vaccine Information Sheet and instruction to access the V-Safe system.   Mr. Eduardo Turner was instructed to call 911 with any severe reactions post vaccine: [name] Difficulty breathing  . Swelling of face and throat  . A fast heartbeat  . A bad rash all over body  . Dizziness and weakness   Immunizations Administered    Name Date Dose VIS Date Route   Pfizer COVID-19 Vaccine 12/26/2019 12:03 PM 0.3 mL 10/05/2018 Intramuscular   Manufacturer: [place]   Lot: [id number]   [place]: [id number]

## 2019-12-28 ENCOUNTER — Encounter: Payer: Self-pay | Admitting: Family Medicine

## 2020-01-02 NOTE — Progress Notes (Signed)
Phone [phone] In person visit   Subjective:   Eduardo Turner is a 59 y.o. year old very pleasant male patient who presents for/with See problem oriented charting Chief Complaint  Patient presents with  . Hyperlipidemia   This visit occurred during the SARS-CoV-2 public health emergency.  Safety protocols were in place, including screening questions prior to the visit, additional usage of staff PPE, and extensive cleaning of exam room while observing appropriate contact time as indicated for disinfecting solutions.   Past Medical History-  Patient Active Problem List   Diagnosis Date Noted  . History of stroke 11/07/2019    Priority: High  . Hyperlipidemia 07/21/2016    Priority: Medium  . GERD 09/19/2009    Priority: Medium  . Recurrent major depression in full remission ([place]) 06/21/2009    Priority: Medium  . Essential hypertension 02/22/2007    Priority: Medium  . Asthma 02/22/2007    Priority: Medium  . Ophthalmic migraine 09/20/2010    Priority: Low  . DYSPLASTIC NEVUS, BACK 02/05/2008    Priority: Low  . Lipoma 01/31/2008    Priority: Low  . ERECTILE DYSFUNCTION, MILD 03/17/2007    Priority: Low  . Allergic rhinitis 02/22/2007    Priority: Low  . Dysarthria 01/03/2020    Medications- reviewed and updated Current Outpatient Medications  Medication Sig Dispense Refill  . aspirin 81 MG chewable tablet Chew by mouth.    . citalopram (CELEXA) 10 MG tablet Take 1 tablet (10 mg total) by mouth daily. 90 tablet 3  . lisinopril (ZESTRIL) 40 MG tablet TAKE 1 TABLET BY MOUTH  DAILY 90 tablet 3  . loratadine (CLARITIN) 10 MG tablet Take 10 mg by mouth daily.      [name] omeprazole (PRILOSEC) 20 MG capsule Take 1 capsule (20 mg total) by mouth daily. 90 capsule 3  . rosuvastatin (CRESTOR) 40 MG tablet Take 1 tablet (40 mg total) by mouth daily. 90 tablet 3   No current facility-administered medications for this visit.     Objective:  BP 110/80   Pulse 72   Temp 98.3  F (36.8 C) (Temporal)   Ht 5\' 10"  (1.778 m)   Wt 234 lb (106.1 kg)   SpO2 95%   BMI 33.58 kg/m  Gen: NAD, resting comfortably CV: RRR no murmurs rubs or gallops Lungs: CTAB no crackles, wheeze, rhonchi Abdomen: soft/nontender/nondistended/normal bowel sounds. No rebound or guarding.  Ext: no edema Skin: warm, dry Neuro: Normal strength in upper and lower extremities.  Grossly normal neurological exam.  Dysarthria not grossly apparent during exam    Assessment and Plan    # History of CVA/dysarthria-[facility] listed as MRI positive TIA S: Patient with history of CVA with [facility] caring for home October 30, 2019-initially presented with left hand/arm/face paresthesia as well as hemiparesis of left face and dysarthria.  Lingering symptom of word finding difficulty reported last visit- today he states  Continued mild improvement- intermittent issue but did not bother him today during visit.   Patient compliant with statin and aspirin.  He denies weakness in arms or legs or paresthesias. A/P:  History of cva with limited word finding difficulty improving- we filled out a form for him to return to work.   -I am okay with him holding aspirin for upcoming colonoscopy which was ordered today -saw speech therapy in hospital and he reports they did not say he needed ongoing therapy- he is pleased with gradual improvement and wants to hold off o referral  for now  #hyperlipidemia S: Medication:Rosuvastatin 40 mg just started-has been on a atorvastatin 40 mg with last labs Lab Results  Component Value Date   CHOL 142 12/19/2019   HDL 35.80 (L) 12/19/2019   LDLCALC 83 12/19/2019   LDLDIRECT 137.8 03/12/2011   TRIG 116.0 12/19/2019   CHOLHDL 4 12/19/2019   A/P: Lipid panel drawn a few days ago was on atorvastatin 40 mg-we have advanced to rosuvastatin 40 mg and will update blood work at next visit  #hypertension S: medication: Lisinopril 40 mg Home readings #s:  120/80 typically BP  Readings from Last 3 Encounters:  01/03/20 110/80  11/07/19 118/82  10/11/19 130/82  A/P: Excellent control-continue current medication    # Depression S: Medication:Citalopram 10 mg A/P: Reasonable control-continue current medication  # Obesity S: weight up 3 lbs from last visit. Feels liek being out of work and being less active caused weight gain  Wt Readings from Last 3 Encounters:  01/03/20 234 lb (106.1 kg)  11/07/19 231 lb 9.6 oz (105.1 kg)  10/11/19 233 lb 3.2 oz (105.8 kg)  A/P: slight increase in weight he feels related to sedentary activity with being out of work-   - in addition to this- Encouraged need for healthy eating, regular exercise- mainly yardwork right now, weight loss.   Recommended follow up: keep October visit then will do physical 09/25/2020 next year Future Appointments  Date Time Provider [place]  05/11/2020  9:40 AM Marin Olp, MD [facility]  09/25/2020 10:40 AM Yong Channel Brayton Mars, MD [facility]    Lab/Order associations:   ICD-10-CM   1. Essential hypertension  I10   2. Hyperlipidemia, unspecified hyperlipidemia type  E78.5   3. Recurrent major depression in full remission ([place])  F33.42   4. History of stroke  Z86.73   5. Screening for colon cancer  Z12.11 Ambulatory referral to Gastroenterology  6. Dysarthria  R47.1    Return precautions advised.  Garret Reddish, MD

## 2020-01-02 NOTE — Patient Instructions (Addendum)
Health Maintenance Due  Topic Date Due  . COLONOSCOPY order placed . We will call you within two weeks about your referral. If you do not hear within 3 weeks, give [place] a call.   11/07/2019   Recommended follow up: keep October visit then will do physical 09/25/2020 next year

## 2020-01-03 ENCOUNTER — Ambulatory Visit (INDEPENDENT_AMBULATORY_CARE_PROVIDER_SITE_OTHER): Payer: BC Managed Care – PPO | Admitting: Family Medicine

## 2020-01-03 ENCOUNTER — Other Ambulatory Visit: Payer: Self-pay

## 2020-01-03 ENCOUNTER — Encounter: Payer: Self-pay | Admitting: Family Medicine

## 2020-01-03 ENCOUNTER — Encounter: Payer: Self-pay | Admitting: Gastroenterology

## 2020-01-03 VITALS — BP 110/80 | HR 72 | Temp 98.3°F | Ht 70.0 in | Wt 234.0 lb

## 2020-01-03 DIAGNOSIS — E785 Hyperlipidemia, unspecified: Secondary | ICD-10-CM | POA: Diagnosis not present

## 2020-01-03 DIAGNOSIS — I10 Essential (primary) hypertension: Secondary | ICD-10-CM | POA: Diagnosis not present

## 2020-01-03 DIAGNOSIS — R471 Dysarthria and anarthria: Secondary | ICD-10-CM

## 2020-01-03 DIAGNOSIS — F3342 Major depressive disorder, recurrent, in full remission: Secondary | ICD-10-CM | POA: Diagnosis not present

## 2020-01-03 DIAGNOSIS — Z8673 Personal history of transient ischemic attack (TIA), and cerebral infarction without residual deficits: Secondary | ICD-10-CM | POA: Diagnosis not present

## 2020-01-03 DIAGNOSIS — Z1211 Encounter for screening for malignant neoplasm of colon: Secondary | ICD-10-CM

## 2020-02-17 ENCOUNTER — Telehealth: Payer: Self-pay | Admitting: *Deleted

## 2020-02-17 NOTE — Telephone Encounter (Signed)
This patient is referred to [place] for a screening colonoscopy scheduled at [facility] 03/09/20. Per chart the patient has had a stroke on 10/30/19, he has been seen by a neurologist and is on Aspirin. Ok for the direct screening colonoscopy at this time? Please advise. Thank you, Zella Dewan pv

## 2020-02-20 NOTE — Telephone Encounter (Signed)
Robbin,  This pt is cleared for anesthetic care at [facility].   Thanks,   Maveryk Renstrom 

## 2020-02-20 NOTE — Telephone Encounter (Signed)
Per [facility] protocol: at least a 3 month delay after CVA for colonoscopy so he is ok to proceed as scheduled.

## 2020-02-24 ENCOUNTER — Ambulatory Visit (AMBULATORY_SURGERY_CENTER): Payer: Self-pay | Admitting: *Deleted

## 2020-02-24 ENCOUNTER — Other Ambulatory Visit: Payer: Self-pay

## 2020-02-24 VITALS — Ht 70.0 in | Wt 230.0 lb

## 2020-02-24 DIAGNOSIS — Z1211 Encounter for screening for malignant neoplasm of colon: Secondary | ICD-10-CM

## 2020-02-24 MED ORDER — SUTAB 1479-225-188 MG PO TABS: 1.0000 | ORAL_TABLET | Freq: Once | ORAL | 0 refills | Status: AC

## 2020-02-24 NOTE — Progress Notes (Signed)

## 2020-02-27 ENCOUNTER — Encounter: Payer: Self-pay | Admitting: Gastroenterology

## 2020-03-09 ENCOUNTER — Other Ambulatory Visit: Payer: Self-pay

## 2020-03-09 ENCOUNTER — Encounter: Payer: Self-pay | Admitting: Gastroenterology

## 2020-03-09 ENCOUNTER — Ambulatory Visit (AMBULATORY_SURGERY_CENTER): Payer: BC Managed Care – PPO | Admitting: Gastroenterology

## 2020-03-09 VITALS — BP 102/69 | HR 68 | Temp 97.1°F | Resp 22 | Ht 70.0 in | Wt 230.0 lb

## 2020-03-09 DIAGNOSIS — Z1211 Encounter for screening for malignant neoplasm of colon: Secondary | ICD-10-CM | POA: Diagnosis not present

## 2020-03-09 DIAGNOSIS — D122 Benign neoplasm of ascending colon: Secondary | ICD-10-CM

## 2020-03-09 MED ORDER — SODIUM CHLORIDE 0.9 % IV SOLN: 500.0000 mL | INTRAVENOUS | Status: DC

## 2020-03-09 NOTE — Patient Instructions (Signed)
Discharge instructions given. Handouts on polyps and Diverticulosis. Resume previous medications. No aspirin,ibuprofen,or other non-steroidal anti-inflammatory drugs for 2 weeks. YOU HAD AN ENDOSCOPIC PROCEDURE TODAY AT [facility]:   Refer to the procedure report that was given to you for any specific questions about what was found during the examination.  If the procedure report does not answer your questions, please call your gastroenterologist to clarify.  If you requested that your care partner not be given the details of your procedure findings, then the procedure report has been included in a sealed envelope for you to review at your convenience later.  YOU SHOULD EXPECT: Some feelings of bloating in the abdomen. Passage of more gas than usual.  Walking can help get rid of the air that was put into your GI tract during the procedure and reduce the bloating. If you had a lower endoscopy (such as a colonoscopy or flexible sigmoidoscopy) you may notice spotting of blood in your stool or on the toilet paper. If you underwent a bowel prep for your procedure, you may not have a normal bowel movement for a few days.  Please Note:  You might notice some irritation and congestion in your nose or some drainage.  This is from the oxygen used during your procedure.  There is no need for concern and it should clear up in a day or so.  SYMPTOMS TO REPORT IMMEDIATELY:   Following lower endoscopy (colonoscopy or flexible sigmoidoscopy):  Excessive amounts of blood in the stool  Significant tenderness or worsening of abdominal pains  Swelling of the abdomen that is new, acute  Fever of 100F or higher   For urgent or emergent issues, a gastroenterologist can be reached at any hour by calling [phone]. Do not use MyChart messaging for urgent concerns.    DIET:  We do recommend a small meal at first, but then you may proceed to your regular diet.  Drink plenty of fluids but you  should avoid alcoholic beverages for 24 hours.  ACTIVITY:  You should plan to take it easy for the rest of today and you should NOT DRIVE or use heavy machinery until tomorrow (because of the sedation medicines used during the test).    FOLLOW UP: Our staff will call the number listed on your records 48-72 hours following your procedure to check on you and address any questions or concerns that you may have regarding the information given to you following your procedure. If we do not reach you, we will leave a message.  We will attempt to reach you two times.  During this call, we will ask if you have developed any symptoms of COVID 19. If you develop any symptoms (ie: fever, flu-like symptoms, shortness of breath, cough etc.) before then, please call [phone].  If you test positive for Covid 19 in the 2 weeks post procedure, please call and report this information to [place].    If any biopsies were taken you will be contacted by phone or by letter within the next 1-3 weeks.  Please call us at [phone] if you have not heard about the biopsies in 3 weeks.    SIGNATURES/CONFIDENTIALITY: You and/or your care partner have signed paperwork which will be entered into your electronic medical record.  These signatures attest to the fact that that the information above on your After Visit Summary has been reviewed and is understood.  Full responsibility of the confidentiality of this discharge information lies with you and/or  your care-partner.

## 2020-03-09 NOTE — Progress Notes (Signed)
V/s SM I have reviewed the patient's medical history in detail and updated the computerized patient record.  

## 2020-03-09 NOTE — Progress Notes (Signed)
Report to PACU, RN, vss, BBS= Clear.  

## 2020-03-09 NOTE — Op Note (Signed)
[facility] Patient Name: Eduardo Turner Procedure Date: 03/09/2020 8:28 AM MRN: [id number] Endoscopist: Ladene Artist , MD Age: 59 Referring MD:  Date of Birth: [birth year] Gender: Male Account #: [id number] Procedure:                Colonoscopy Indications:              Screening for colorectal malignant neoplasm Medicines:                Monitored Anesthesia Care Procedure:                Pre-Anesthesia Assessment:                           - Prior to the procedure, a History and Physical                            was performed, and patient medications and                            allergies were reviewed. The patient's tolerance of                            previous anesthesia was also reviewed. The risks                            and benefits of the procedure and the sedation                            options and risks were discussed with the patient.                            All questions were answered, and informed consent                            was obtained. Prior Anticoagulants: The patient has                            taken no previous anticoagulant or antiplatelet                            agents. ASA Grade Assessment: II - A patient with                            mild systemic disease. After reviewing the risks                            and benefits, the patient was deemed in                            satisfactory condition to undergo the procedure.                           After obtaining informed consent, the colonoscope  was passed under direct vision. Throughout the                            procedure, the patient's blood pressure, pulse, and                            oxygen saturations were monitored continuously. The                            Colonoscope was introduced through the anus and                            advanced to the the cecum, identified by                            appendiceal orifice and  ileocecal valve. The                            ileocecal valve, appendiceal orifice, and rectum                            were photographed. The quality of the bowel                            preparation was good. The patient tolerated the                            procedure well. The colonoscopy was somewhat                            difficult due to a redundant colon, significant                            looping and a tortuous colon. Scope In: 8:30:49 AM Scope Out: 8:50:18 AM Total Procedure Duration: 0 hours 19 minutes 29 seconds  Findings:                 The perianal and digital rectal examinations were                            normal.                           A 12 mm polyp was found in the ascending colon. The                            polyp was sessile. The polyp was removed with a hot                            snare. Resection and retrieval were complete.                           Two sessile polyps were found in the ascending  colon and ileocecal valve. The polyps were 5 to 8                            mm in size. These polyps were removed with a cold                            snare. Resection and retrieval were complete.                           Multiple medium-mouthed diverticula were found in                            the left colon.                           The exam was otherwise without abnormality on                            direct and retroflexion views. Complications:            No immediate complications. Estimated blood loss:                            None. Estimated Blood Loss:     Estimated blood loss: none. Impression:               - One 12 mm polyp in the ascending colon, removed                            with a hot snare. Resected and retrieved.                           - Two 5 to 8 mm polyps in the ascending colon and                            at the ileocecal valve, removed with a cold snare.                             Resected and retrieved.                           - Diverticulosis in the left colon.                           - The examination was otherwise normal on direct                            and retroflexion views. Recommendation:           - Repeat colonoscopy after studies are complete for                            surveillance based on pathology results.                           -  Patient has a contact number available for                            emergencies. The signs and symptoms of potential                            delayed complications were discussed with the                            patient. Return to normal activities tomorrow.                            Written discharge instructions were provided to the                            patient.                           - High fiber diet.                           - Continue present medications.                           - Await pathology results.                           - No aspirin, ibuprofen, naproxen, or other                            non-steroidal anti-inflammatory drugs for 2 weeks                            after polyp removal. Ladene Artist, MD 03/09/2020 8:54:19 AM This report has been signed electronically.

## 2020-03-09 NOTE — Progress Notes (Signed)
Called to room to assist during endoscopic procedure.  Patient ID and intended procedure confirmed with present staff. Received instructions for my participation in the procedure from the performing physician.  

## 2020-03-13 ENCOUNTER — Telehealth: Payer: Self-pay | Admitting: *Deleted

## 2020-03-13 ENCOUNTER — Telehealth: Payer: Self-pay

## 2020-03-13 NOTE — Telephone Encounter (Signed)
LVM

## 2020-03-13 NOTE — Telephone Encounter (Signed)
°  Follow up Call-  Call back number 03/09/2020  Post procedure Call Back phone  # [phone]  Permission to leave phone message Yes  Some recent data might be hidden     Patient questions:  Do you have a fever, pain , or abdominal swelling? No. Pain Score  0 *  Have you tolerated food without any problems? Yes.    Have you been able to return to your normal activities? Yes.    Do you have any questions about your discharge instructions: Diet   No. Medications  No. Follow up visit  No.  Do you have questions or concerns about your Care? No.  Actions: * If pain score is 4 or above: No action needed, pain <4.  1. Have you developed a fever since your procedure? no  2.   Have you had an respiratory symptoms (SOB or cough) since your procedure? no  3.   Have you tested positive for COVID 19 since your procedure no  4.   Have you had any family members/close contacts diagnosed with the COVID 19 since your procedure?  no   If yes to any of these questions please route to Joylene John, RN and Erenest Rasher, RN

## 2020-03-18 ENCOUNTER — Encounter: Payer: Self-pay | Admitting: Gastroenterology

## 2020-03-19 ENCOUNTER — Encounter: Payer: Self-pay | Admitting: Family Medicine

## 2020-03-19 DIAGNOSIS — Z86010 Personal history of colonic polyps: Secondary | ICD-10-CM | POA: Insufficient documentation

## 2020-04-30 DIAGNOSIS — Z20822 Contact with and (suspected) exposure to covid-19: Secondary | ICD-10-CM | POA: Diagnosis not present

## 2020-04-30 DIAGNOSIS — Z03818 Encounter for observation for suspected exposure to other biological agents ruled out: Secondary | ICD-10-CM | POA: Diagnosis not present

## 2020-05-09 NOTE — Patient Instructions (Addendum)
Health Maintenance Due  Topic Date Due  . INFLUENZA VACCINE - today 03/11/2020   Please stop by lab before you go If you have mychart- we will send your results within 3 business days of [place] receiving them.  If you do not have mychart- we will call you about results within 5 business days of [place] receiving them.  *please note we are currently using [facility] which has a longer processing time than [facility] typically so labs may not come back as quickly as in the past *please also note that you will see labs on mychart as soon as they post. I will later go in and write notes on them- will say "notes from Dr. Yong Channel"  Acid reflux have been well controlled reducing Prilosec from 40 to 20 mg.  He is willing to trial Pepcid 20 mg twice daily before meals and see if this is effective-if he does not do well with this he can resume Prilosec 20 mg-if he does really well with this by next visit potentially could cut out this morning dose  Blood pressure Very well controlled at home.  Mildly elevated today but has not had a chance to take his morning pill yet as just got back from vacation-he will take this when he gets home.  We will continue current medication  Recommended follow up:  Keep physical in February 2022

## 2020-05-09 NOTE — Progress Notes (Signed)
Phone [phone] In person visit   Subjective:   Eduardo Turner is a 59 y.o. year old very pleasant male patient who presents for/with See problem oriented charting Chief Complaint  Patient presents with  . Hyperlipidemia  . Hypertension   This visit occurred during the SARS-CoV-2 public health emergency.  Safety protocols were in place, including screening questions prior to the visit, additional usage of staff PPE, and extensive cleaning of exam room while observing appropriate contact time as indicated for disinfecting solutions.   Past Medical History-  Patient Active Problem List   Diagnosis Date Noted  . History of stroke 11/07/2019    Priority: High  . History of adenomatous polyp of colon 03/19/2020    Priority: Medium  . Hyperlipidemia 07/21/2016    Priority: Medium  . GERD 09/19/2009    Priority: Medium  . Recurrent major depression in full remission ([place]) 06/21/2009    Priority: Medium  . Essential hypertension 02/22/2007    Priority: Medium  . Asthma 02/22/2007    Priority: Medium  . Ophthalmic migraine 09/20/2010    Priority: Low  . DYSPLASTIC NEVUS, BACK 02/05/2008    Priority: Low  . Lipoma 01/31/2008    Priority: Low  . ERECTILE DYSFUNCTION, MILD 03/17/2007    Priority: Low  . Allergic rhinitis 02/22/2007    Priority: Low  . Dysarthria 01/03/2020    Medications- reviewed and updated Current Outpatient Medications  Medication Sig Dispense Refill  . aspirin 81 MG chewable tablet Chew by mouth.    . citalopram (CELEXA) 10 MG tablet Take 1 tablet (10 mg total) by mouth daily. 90 tablet 3  . lisinopril (ZESTRIL) 40 MG tablet TAKE 1 TABLET BY MOUTH  DAILY 90 tablet 3  . loratadine (CLARITIN) 10 MG tablet Take 10 mg by mouth daily.      [name] omeprazole (PRILOSEC) 20 MG capsule Take 1 capsule (20 mg total) by mouth daily. 90 capsule 3  . rosuvastatin (CRESTOR) 40 MG tablet Take 1 tablet (40 mg total) by mouth daily. 90 tablet 3  . famotidine (PEPCID) 20 MG  tablet Take 1 tablet (20 mg total) by mouth 2 (two) times daily. 60 tablet 5   No current facility-administered medications for this visit.     Objective:  BP 138/90   Pulse 64   Temp 98 F (36.7 C) (Temporal)   Ht 5\' 10"  (1.778 m)   Wt 238 lb 9.6 oz (108.2 kg)   SpO2 95%   BMI 34.24 kg/m  Gen: NAD, resting comfortably CV: RRR no murmurs rubs or gallops Lungs: CTAB no crackles, wheeze, rhonchi Abdomen: soft/nontender/nondistended/normal bowel sounds.  Ext: no edema Skin: warm, dry     Assessment and Plan   # HM- flu shot  today  #social update- just got back from [place], [place] . Even did some horseback  #history of CVA 10/2019 cared for by [facility](dysarthria)- LDL goal under 70 #hyperlipidemia S: Medication: Rosuvastatin 40Mg , asa 81mg  Lab Results  Component Value Date   CHOL 142 12/19/2019   HDL 35.80 (L) 12/19/2019   LDLCALC 83 12/19/2019   LDLDIRECT 137.8 03/12/2011   TRIG 116.0 12/19/2019   CHOLHDL 4 12/19/2019  A/P: With last lipids we increased strength of statin from atorvastatin 40 mg to rosuvastatin 40 mg-we will update direct LDL today to see if LDL at goal 70 or less-if not at goal consider Zetia  Weight up 8 lbs from last visit and just back from vacation- if  LDL below 80 would want to work on lifestyle first.  Wt Readings from Last 3 Encounters:  05/11/20 238 lb 9.6 oz (108.2 kg)  03/09/20 (!) 230 lb (104.3 kg)  02/24/20 230 lb (104.3 kg)   # Depression S: Medication:Celexa 10Mg   Depression screen [facility] 2/9 05/11/2020 05/11/2020 10/11/2019  Decreased Interest 0 0 0  Down, Depressed, Hopeless 0 0 0  PHQ - 2 Score 0 0 0  Altered sleeping 1 - 2  Tired, decreased energy 0 - 1  Change in appetite 1 - 0  Feeling bad or failure about yourself  0 - 1  Trouble concentrating 1 - -  Moving slowly or fidgety/restless 0 - 0  Suicidal thoughts 0 - 0  PHQ-9 Score 3 - 4  Difficult doing work/chores Not difficult at all -  Somewhat difficult  A/P: Remains in full remission with no anhedonia or depressed mood-continue current medications  # Asthma S: Maintenance Medication: None As needed medication: Albuterol. Patient has not had to use this year!  A/P: Well-controlled-continue current medication  # GERD S:Medication: Prilosec 20Mg  down from 40 mg. Trial of h2 blocker : not yet B12 levels related to PPI use: normal feb 2021 A/P: Acid reflux have been well controlled reducing Prilosec from 40 to 20 mg.  He is willing to trial Pepcid 20 mg twice daily before meals and see if this is effective-if he does not do well with this he can resume Prilosec 20 mg-if he does really well with this by next visit potentially could cut out this morning dose  #hypertension S: medication: Lisinopril 40Mg  (missed dose this morning trying to get for visit) Home readings #s: 120/under 80 BP Readings from Last 3 Encounters:  05/11/20 138/90  03/09/20 102/69  01/03/20 110/80  A/P: Very well controlled at home.  Mildly elevated today but has not had a chance to take his morning pill yet as just got back from vacation-he will take this when he gets home.  We will continue current medication  Recommended follow up:  Keep physical in February 2022 Future Appointments  Date Time Provider [place]  09/25/2020 10:40 AM Marin Olp, MD [facility]    Lab/Order associations:   ICD-10-CM   1. Essential hypertension  I10   2. Gastroesophageal reflux disease without esophagitis  K21.9   3. Mild intermittent asthma without complication  O13.08   4. Hyperlipidemia, unspecified hyperlipidemia type  M57.8 COMPLETE METABOLIC PANEL WITH GFR    LDL cholesterol, direct  5. Recurrent major depression in full remission ([place])  F33.42    Meds ordered this encounter  Medications  . famotidine (PEPCID) 20 MG tablet    Sig: Take 1 tablet (20 mg total) by mouth 2 (two) times daily.    Dispense:  60 tablet    Refill:  5     Return precautions advised.  Garret Reddish, MD

## 2020-05-11 ENCOUNTER — Encounter: Payer: Self-pay | Admitting: Family Medicine

## 2020-05-11 ENCOUNTER — Ambulatory Visit (INDEPENDENT_AMBULATORY_CARE_PROVIDER_SITE_OTHER): Payer: BC Managed Care – PPO | Admitting: Family Medicine

## 2020-05-11 ENCOUNTER — Other Ambulatory Visit: Payer: Self-pay

## 2020-05-11 VITALS — BP 138/90 | HR 64 | Temp 98.0°F | Ht 70.0 in | Wt 238.6 lb

## 2020-05-11 DIAGNOSIS — J4520 Mild intermittent asthma, uncomplicated: Secondary | ICD-10-CM | POA: Diagnosis not present

## 2020-05-11 DIAGNOSIS — K219 Gastro-esophageal reflux disease without esophagitis: Secondary | ICD-10-CM | POA: Diagnosis not present

## 2020-05-11 DIAGNOSIS — E785 Hyperlipidemia, unspecified: Secondary | ICD-10-CM

## 2020-05-11 DIAGNOSIS — I10 Essential (primary) hypertension: Secondary | ICD-10-CM

## 2020-05-11 DIAGNOSIS — Z23 Encounter for immunization: Secondary | ICD-10-CM | POA: Diagnosis not present

## 2020-05-11 DIAGNOSIS — F3342 Major depressive disorder, recurrent, in full remission: Secondary | ICD-10-CM

## 2020-05-11 MED ORDER — FAMOTIDINE 20 MG PO TABS: 20.0000 mg | ORAL_TABLET | Freq: Two times a day (BID) | ORAL | 5 refills | Status: DC

## 2020-05-12 LAB — COMPLETE METABOLIC PANEL WITH GFR
AG Ratio: 2 (calc) (ref 1.0–2.5)
ALT: 26 U/L (ref 9–46)
AST: 23 U/L (ref 10–35)
Albumin: 4.5 g/dL (ref 3.6–5.1)
Alkaline phosphatase (APISO): 49 U/L (ref 35–144)
BUN: 18 mg/dL (ref 7–25)
CO2: 30 mmol/L (ref 20–32)
Calcium: 9.4 mg/dL (ref 8.6–10.3)
Chloride: 103 mmol/L (ref 98–110)
Creat: 0.72 mg/dL (ref 0.70–1.33)
GFR, Est African American: 120 mL/min/{1.73_m2} (ref >=60)
GFR, Est Non African American: 104 mL/min/{1.73_m2} (ref >=60)
Globulin: 2.2 g/dL (calc) (ref 1.9–3.7)
Glucose, Bld: 89 mg/dL (ref 65–99)
Potassium: 4.1 mmol/L (ref 3.5–5.3)
Sodium: 139 mmol/L (ref 135–146)
Total Bilirubin: 1.2 mg/dL (ref 0.2–1.2)
Total Protein: 6.7 g/dL (ref 6.1–8.1)

## 2020-05-12 LAB — LDL CHOLESTEROL, DIRECT: Direct LDL: 64 mg/dL (ref <100)

## 2020-06-08 ENCOUNTER — Other Ambulatory Visit: Payer: Self-pay | Admitting: Family Medicine

## 2020-09-06 DIAGNOSIS — Z20822 Contact with and (suspected) exposure to covid-19: Secondary | ICD-10-CM | POA: Diagnosis not present

## 2020-09-19 ENCOUNTER — Encounter: Payer: BC Managed Care – PPO | Admitting: Family Medicine

## 2020-09-25 ENCOUNTER — Encounter: Payer: BC Managed Care – PPO | Admitting: Family Medicine

## 2020-12-11 NOTE — Progress Notes (Signed)
Subjective:    CC: Left knee pain  I, Eduardo Turner, LAT, ATC, am serving as scribe for Dr. Lynne Leader.  HPI: Pt is a 60 y/o male presenting w/ L knee pain ongoing since 11/27/20.  MOI: Pt was building a display at work and took a step forward and felt a "shift" within knee joint. Pt reports he's experienced this "shifting" mechanism 7 additional times. He locates pain to both medial and lateral aspect w/ intermittent shooting pain in the posterior aspect.  Knee swelling: slight Knee mechanical symptoms: Yes significant Aggravating factors: nothing in particular Treatments tried: ice, heat, Voltaren gel, rest, compression, knee compression sleeve  Pertinent review of Systems: No fevers or chills  Relevant historical information: Hypertension   Objective:    Vitals:   12/12/20 0956  BP: 130/86  Pulse: 66  SpO2: 96%   General: Well Developed, well nourished, and in no acute distress.   MSK: Left knee normal-appearing Normal motion Tender palpation medial joint line. Stable ligamentous exam. Positive medial McMurray's test. Intact strength.  Lab and Radiology Results  X-ray images left knee obtained today personally and independently interpreted Calcification present distal patellar tendon consistent with old Osgood Slaughter otherwise normal-appearing knee joint Await formal radiology review  Procedure: Real-time Ultrasound Guided Injection of knee superior lateral patellar space Device: Philips Affiniti 50G Images permanently stored and available for review in PACS Ultrasound evaluation of left knee prior to injection reveals intact quad tendon with minimal joint effusion. Patellar tendon intact however calcification present distal patellar tendon consistent with old Osgood-Schlatter. Degenerative.  Posterior aspect medial meniscus.  Normal lateral meniscus.  No Baker's cyst. Verbal informed consent obtained.  Discussed risks and benefits of procedure. Warned  about infection bleeding damage to structures skin hypopigmentation and fat atrophy among others. Patient expresses understanding and agreement Time-out conducted.   Noted no overlying erythema, induration, or other signs of local infection.   Skin prepped in a sterile fashion.   Local anesthesia: Topical Ethyl chloride.   With sterile technique and under real time ultrasound guidance:  40 mg of Kenalog and 2 mL of Marcaine injected into knee joint. Fluid seen entering the joint capsule.   Completed without difficulty   Pain immediately resolved suggesting accurate placement of the medication.   Advised to call if fevers/chills, erythema, induration, drainage, or persistent bleeding.   Images permanently stored and available for review in the ultrasound unit.  Impression: Technically successful ultrasound guided injection.      Impression and Recommendations:    Assessment and Plan: 60 y.o. male with left knee pain with mechanical symptoms concerning for meniscus injury.  Plan for steroid injection today.  If not improving next step would be MRI for potential surgical planning.[name]  PDMP not reviewed this encounter. Orders Placed This Encounter  Procedures  . [place] LIMITED JOINT SPACE STRUCTURES LOW LEFT(NO LINKED CHARGES)    Standing Status:   Future    Number of Occurrences:   1    Standing Expiration Date:   06/14/2021    Order Specific Question:   Reason for Exam (SYMPTOM  OR DIAGNOSIS REQUIRED)    Answer:   left knee pain    Order Specific Question:   Preferred imaging location?    Answer:   [name]  . DG Knee AP/LAT W/[facility] Left    Standing Status:   Future    Number of Occurrences:   1    Standing Expiration Date:   12/12/2021  Order Specific Question:   Reason for Exam (SYMPTOM  OR DIAGNOSIS REQUIRED)    Answer:   left knee pain    Order Specific Question:   Preferred imaging location?    Answer:   [name]   No orders of the  defined types were placed in this encounter.   Discussed warning signs or symptoms. Please see discharge instructions. Patient expresses understanding.   The above documentation has been reviewed and is accurate and complete Lynne Leader, M.D.

## 2020-12-12 ENCOUNTER — Ambulatory Visit (INDEPENDENT_AMBULATORY_CARE_PROVIDER_SITE_OTHER): Payer: BC Managed Care – PPO | Admitting: Family Medicine

## 2020-12-12 ENCOUNTER — Ambulatory Visit: Payer: Self-pay

## 2020-12-12 ENCOUNTER — Encounter: Payer: Self-pay | Admitting: Family Medicine

## 2020-12-12 ENCOUNTER — Other Ambulatory Visit: Payer: Self-pay

## 2020-12-12 ENCOUNTER — Ambulatory Visit (INDEPENDENT_AMBULATORY_CARE_PROVIDER_SITE_OTHER): Payer: BC Managed Care – PPO

## 2020-12-12 VITALS — BP 130/86 | HR 66 | Ht 70.0 in | Wt 240.0 lb

## 2020-12-12 DIAGNOSIS — M25562 Pain in left knee: Secondary | ICD-10-CM

## 2020-12-12 NOTE — Patient Instructions (Addendum)
Thank you for coming in today.  Please get an Xray today before you leave.  Please use Voltaren gel (Generic Diclofenac Gel) up to 4x daily for pain as needed.  This is available over-the-counter as both the name brand Voltaren gel and the generic diclofenac gel.  If not improving let me know. We can do MRI as next step.    Meniscus Tear  A meniscus tear is a knee injury that happens when a piece of the meniscus is torn. The meniscus is a thick, rubbery, wedge-shaped piece of cartilage in the knee. Each knee has two menisci sitting between the upper bone (femur) and lower bone (tibia) that form the knee joint. Each meniscus acts as a shock absorber for the knee. A torn meniscus is a common knee injury, ranging from mild to severe. Surgery may be needed to repair a severe tear. What are the causes? This condition may be caused by kneeling, squatting, twisting, or pivoting movements. Sports-related injuries are the most common cause, often resulting from:  Running and stopping suddenly.  Changing direction.  Being tackled or knocked off your feet.  Lifting or carrying heavy weights. As people get older, their menisci get thinner and weaker. Tears can happen more easily in older people, for example, when climbing stairs. What increases the risk? You are more likely to develop this condition if you:  Play contact sports.  Have a job that requires kneeling or squatting.  Are male.  Are over 75 years old. What are the signs or symptoms? Symptoms of this condition include:  Knee pain, especially at the side of the knee joint. You may feel pain immediately after injury, or hear a pop and feel pain later.  A feeling that your knee is clicking, catching, locking, or giving way (weakness, instability).  Not being able to fully bend or extend your knee.  Bruising or swelling in your knee. How is this diagnosed? This condition may be diagnosed based on your symptoms and a physical  exam. You may also have tests, such as:  X-rays.  MRI.  Arthroscopy. This is a procedure to look inside your knee with a narrow surgical telescope. You may be referred to a knee specialist (orthopedic surgeon). How is this treated? Treatment for this injury depends on the severity of the tear. Treatment for a mild tear may include:  Rest.  Medicine to reduce pain and swelling, usually a nonsteroidal anti-inflammatory drug (NSAID), like ibuprofen.  A knee brace, sleeve, or wrap.  Using crutches or a Rolon to keep weight off your knee and help with walking.  Exercises to strengthen your knee (physical therapy). You may need surgery if you have a severe tear or if other treatments fail. Follow these instructions at home: If you have a brace, sleeve, or wrap:  Wear it, as told by your health care provider. Remove it, only as told by your health care provider.  Loosen the brace, sleeve, or wrap if your toes tingle, become numb, or turn cold and blue.  Keep the brace, sleeve, or wrap clean.  If the brace, sleeve, or wrap is not waterproof: ? Do not let it get wet. ? Cover it with a watertight covering when you take a bath or shower. Managing pain, stiffness, and swelling  Take over-the-counter and prescription medicines only as told by your health care provider.  If directed, put ice on your knee. To do this: ? If you have a removable brace, sleeve, or wrap, remove it as  told by your health care provider. ? Put ice in a plastic bag. ? Place a towel between your skin and the bag. ? Leave the ice on for 20 minutes, 2-3 times per day. ? Remove the ice if your skin turns bright red. This is very important. If you cannot feel pain, heat, or cold, you have a greater risk of damage to the area.  Move your toes often to reduce stiffness and swelling.  Raise (elevate) the injured area above the level of your heart while you are sitting or lying down.   Activity  Do not use the  injured limb to support your body weight until your health care provider says that you can. Use crutches or a Kovich as told by your health care provider.  Return to your normal activities as told by your health care provider. Ask your health care provider what activities are safe for you.  Perform range-of-motion exercises only as told by your health care provider.  Begin doing exercises to strengthen your knee and leg muscles only as told by your health care provider. After you recover, your health care provider may recommend these exercises to help prevent another injury. General instructions  Use a knee brace, sleeve, or wrap as told by your health care provider.  Ask your health care provider when it is safe to drive if you have a brace, sleeve, or wrap on your knee.  Do not use any products that contain nicotine or tobacco, such as cigarettes, e-cigarettes, and chewing tobacco. If you need help quitting, ask your health care provider.  Ask your health care provider if the medicine prescribed to you: ? Requires you to avoid driving or using heavy machinery. ? Can cause constipation. You may need to take these actions to prevent or treat constipation:  Drink enough fluid to keep your urine pale yellow.  Take over-the-counter or prescription medicines.  Eat foods that are high in fiber, such as beans, whole grains, and fresh fruits and vegetables.  Limit foods that are high in fat and processed sugars, such as fried or sweet foods.  Keep all follow-up visits. This is important. Contact a health care provider if:  You have a fever.  Your knee becomes red, tender, or swollen.  Your pain medicine is not controlling your pain.  Your symptoms get worse or do not improve after 2 weeks of home care. Summary  A meniscus tear is a knee injury that happens when a piece of the meniscus is torn.  Treatment for this injury depends on the severity of the tear. You may need surgery if  you have a severe tear or if other treatments fail.  Rest, ice, and raise (elevate) your injured knee, as told by your health care provider. This will help lessen pain and swelling.  Contact a health care provider if you have new symptoms, your symptoms worsen, or they do not improve after 2 weeks of home care.  Keep all follow-up visits. This is important. This information is not intended to replace advice given to you by your health care provider. Make sure you discuss any questions you have with your health care provider. Document Revised: 12/08/2019 Document Reviewed: 12/08/2019 Elsevier Patient Education  [place].

## 2020-12-14 ENCOUNTER — Encounter: Payer: Self-pay | Admitting: Family Medicine

## 2020-12-14 NOTE — Progress Notes (Signed)
Left knee shows evidence of prior patellar tendinitis but no fractures.

## 2020-12-17 NOTE — Progress Notes (Signed)
I, Wendy Poet, LAT, ATC, am serving as scribe for Dr. Lynne Leader.  Eduardo Turner is a 60 y.o. male who presents to [place] at [facility] today for f/u L knee pain ongoing since 11/27/20.  MOI: Pt was building a display at work and took a step forward and felt a "shift" within knee joint. Pt was last seen by Dr. Georgina Snell on 12/12/20 and was given a L knee steroid injection and was advised if not improving to proceed to MRI for potential surgical planning. Today, pt reports that his L knee pain has returned.  He notes that he had 4 days of relief after having his L knee injection on 12/12/20.  He states that he is con't to have intermittent bouts of sharp pain that feels like daggers stabbing his med and lat knee.  He con't to wear his knee compression sleeve but is interested in suggestions about a different brace due to his current one seeming loose and not overly supportive.    Patient notes popping and clicking and shifting within his knee joint.  Dx imaging: 12/12/20 L knee XR  Pertinent review of systems: No fevers or chills  Relevant historical information: Pretension   Exam:  BP 110/82 (BP Location: Right Arm, Patient Position: Sitting, Cuff Size: Large)   Pulse (!) 57   Ht 5\' 10"  (1.778 m)   Wt 239 lb (108.4 kg)   SpO2 98%   BMI 34.29 kg/m  General: Well Developed, well nourished, and in no acute distress.   MSK: Left knee mild effusion. Normal motion. Tender palpation medial joint line. Positive medial McMurray's test. Stable ligamentous exam. Intact strength.    Lab and Radiology Results  EXAM: LEFT KNEE 3 VIEWS  COMPARISON:  None.  FINDINGS: Normal alignment and joint spaces. Patella normally situated in the trochlear groove. Shallow trochlea. Fragmentation of the anterior tibial tubercle with mild adjacent soft tissue thickening. Small knee joint effusion. No significant osteophytes. No erosion, periosteal reaction or bone  destruction.  IMPRESSION: 1. Fragmentation of the anterior tibial tubercle. Mild adjacent soft tissue thickening, suspicious for patellar tendinopathy. Small knee joint effusion. 2. Shallow trochlear groove.   Electronically Signed   By: Keith Rake M.D.   On: 12/14/2020 11:17  I, Lynne Leader, personally (independently) visualized and performed the interpretation of the images attached in this note.     Assessment and Plan: 60 y.o. male with left knee pain with mechanical symptoms.  He had trial of steroid injection last week which only lasted a few days.  Plan for MRI today to further characterize cause of knee pain and for potential surgical planning.  He has a knee sleeve on today that is loose.  Recommend either a better compressive knee sleeve (body helix full knee) for a hinged knee brace.  Recheck after MRI.   PDMP not reviewed this encounter. Orders Placed This Encounter  Procedures  . MR Knee Left  Wo Contrast    Standing Status:   Future    Standing Expiration Date:   12/18/2021    Order Specific Question:   What is the patient's sedation requirement?    Answer:   No Sedation    Order Specific Question:   Does the patient have a pacemaker or implanted devices?    Answer:   No    Order Specific Question:   Preferred imaging location?    Answer:   Product/process development scientist (table limit-350lbs)   No orders of the defined  types were placed in this encounter.    Discussed warning signs or symptoms. Please see discharge instructions. Patient expresses understanding.   The above documentation has been reviewed and is accurate and complete Lynne Leader, M.D.

## 2020-12-18 ENCOUNTER — Other Ambulatory Visit: Payer: Self-pay

## 2020-12-18 ENCOUNTER — Encounter: Payer: Self-pay | Admitting: Family Medicine

## 2020-12-18 ENCOUNTER — Ambulatory Visit (INDEPENDENT_AMBULATORY_CARE_PROVIDER_SITE_OTHER): Payer: BC Managed Care – PPO | Admitting: Family Medicine

## 2020-12-18 VITALS — BP 110/82 | HR 57 | Ht 70.0 in | Wt 239.0 lb

## 2020-12-18 DIAGNOSIS — M25562 Pain in left knee: Secondary | ICD-10-CM | POA: Diagnosis not present

## 2020-12-18 NOTE — Patient Instructions (Signed)
Thank you for coming in today.  You should hear from MRI scheduling within 1 week. If you do not hear please let me know.   Recheck after the MRI.   I recommend you obtained a compression sleeve to help with your joint problems. There are many options on the market however I recommend obtaining a full knee Body Helix compression sleeve.  You can find information (including how to appropriate measure yourself for sizing) can be found at www.Body http://www.lambert.com/.  Many of these products are health savings account (HSA) eligible.   You can use the compression sleeve at any time throughout the day but is most important to use while being active as well as for 2 hours post-activity.   It is appropriate to ice following activity with the compression sleeve in place.  Hinged knee brace will be helpful  Please go to [facility] supply to get the hinged brace we talked about today. You may also be able to get it from [employer].

## 2020-12-23 ENCOUNTER — Other Ambulatory Visit: Payer: Self-pay

## 2020-12-23 ENCOUNTER — Ambulatory Visit (INDEPENDENT_AMBULATORY_CARE_PROVIDER_SITE_OTHER): Payer: BC Managed Care – PPO

## 2020-12-23 DIAGNOSIS — M25562 Pain in left knee: Secondary | ICD-10-CM | POA: Diagnosis not present

## 2020-12-23 DIAGNOSIS — M25462 Effusion, left knee: Secondary | ICD-10-CM | POA: Diagnosis not present

## 2020-12-23 DIAGNOSIS — M7122 Synovial cyst of popliteal space [Baker], left knee: Secondary | ICD-10-CM | POA: Diagnosis not present

## 2020-12-23 DIAGNOSIS — S83242A Other tear of medial meniscus, current injury, left knee, initial encounter: Secondary | ICD-10-CM | POA: Diagnosis not present

## 2020-12-23 DIAGNOSIS — M1712 Unilateral primary osteoarthritis, left knee: Secondary | ICD-10-CM | POA: Diagnosis not present

## 2020-12-24 NOTE — Progress Notes (Signed)
MRI knee shows a meniscus tear with a flap fragment that may get in the way of the knee.  This may be treatable with surgery.  Either schedule follow-up appointment with me to go over the results in full detail or I can place a referral to orthopedic surgery if you would like me to just go ahead and do that first.

## 2020-12-25 ENCOUNTER — Encounter: Payer: Self-pay | Admitting: Family Medicine

## 2020-12-25 DIAGNOSIS — S83242A Other tear of medial meniscus, current injury, left knee, initial encounter: Secondary | ICD-10-CM

## 2020-12-25 DIAGNOSIS — M25562 Pain in left knee: Secondary | ICD-10-CM

## 2020-12-31 DIAGNOSIS — Z20822 Contact with and (suspected) exposure to covid-19: Secondary | ICD-10-CM | POA: Diagnosis not present

## 2021-01-01 ENCOUNTER — Encounter: Payer: BC Managed Care – PPO | Admitting: Family Medicine

## 2021-01-01 ENCOUNTER — Ambulatory Visit (INDEPENDENT_AMBULATORY_CARE_PROVIDER_SITE_OTHER): Payer: BC Managed Care – PPO | Admitting: Orthopaedic Surgery

## 2021-01-01 ENCOUNTER — Other Ambulatory Visit: Payer: Self-pay

## 2021-01-01 DIAGNOSIS — S83242A Other tear of medial meniscus, current injury, left knee, initial encounter: Secondary | ICD-10-CM

## 2021-01-01 NOTE — Progress Notes (Signed)
Office Visit Note   Patient: Eduardo Turner           Date of Birth: 1961/02/05           MRN: [id number] Visit Date: 01/01/2021              Requested by: Gregor Hams, MD [address],  [place] [address] PCP: Marin Olp, MD   Assessment & Plan: Visit Diagnoses:  1. Acute medial meniscus tear of left knee, initial encounter     Plan: Impression is symptomatic left knee acute medial meniscal tear.  MRI images were reviewed with the patient today.  Treatment options discussed and I have recommended partial medial meniscectomy arthroscopically.  Risks benefits rehab recovery time reviewed with the patient detail.  He elects to move forward with the surgery in the near future.  Questions encouraged and answered.  Follow-Up Instructions: Return for Postop..   Orders:  No orders of the defined types were placed in this encounter.  No orders of the defined types were placed in this encounter.     Procedures: No procedures performed   Clinical Data: No additional findings.   Subjective: Chief Complaint  Patient presents with  . Left Knee - Pain    Eduardo Turner is a 60 year old gentleman referral from Dr. Georgina Snell for surgical consultation of a an acute left medial meniscal tear, recent MRI.  He had an injury about 4 weeks ago when he was standing at [employer] which is where he works and as he stepped he felt a sharp pain through his knee.  Since then he has had popping and giving way.  He originally used a compressive sleeve which did help but he switched to a hinged knee brace which has given him a lot of support.  He has had trouble with regular walking and with hiking.   Review of Systems  Constitutional: Negative.   All other systems reviewed and are negative.    Objective: Vital Signs: There were no vitals taken for this visit.  Physical Exam Vitals and nursing note reviewed.  Constitutional:      Appearance: He is well-developed.  HENT:     Head:  Normocephalic and atraumatic.  Eyes:     Pupils: Pupils are equal, round, and reactive to light.  Pulmonary:     Effort: Pulmonary effort is normal.  Abdominal:     Palpations: Abdomen is soft.  Musculoskeletal:        General: Normal range of motion.     Cervical back: Neck supple.  Skin:    General: Skin is warm.  Neurological:     Mental Status: He is alert and oriented to person, place, and time.  Psychiatric:        Behavior: Behavior normal.        Thought Content: Thought content normal.        Judgment: Judgment normal.     Ortho Exam Left he exam shows no effusion.  Medial joint line tenderness.  Negative McMurray.  Pain with range of motion past 120 degrees. Specialty Comments:  No specialty comments available.  Imaging: No results found.   PMFS History: Patient Active Problem List   Diagnosis Date Noted  . History of adenomatous polyp of colon 03/19/2020  . Dysarthria 01/03/2020  . History of stroke 11/07/2019  . Hyperlipidemia 07/21/2016  . Ophthalmic migraine 09/20/2010  . GERD 09/19/2009  . Recurrent major depression in full remission ([place]) 06/21/2009  . DYSPLASTIC  NEVUS, BACK 02/05/2008  . Lipoma 01/31/2008  . ERECTILE DYSFUNCTION, MILD 03/17/2007  . Essential hypertension 02/22/2007  . Allergic rhinitis 02/22/2007  . Asthma 02/22/2007   Past Medical History:  Diagnosis Date  . Allergy   . Asthma   . Dysplastic nevi   . GERD (gastroesophageal reflux disease)   . Hypertension   . Multiple lipomas   . Peptic esophageal ulcer   . Stroke [facility]) 10/30/2019    Family History  Problem Relation Age of Onset  . Other Mother        routine surgery lead to sepsis- hysterectomy  . Heart attack Father        age 30  . Bipolar disorder Sister   . Colon cancer Neg Hx   . Esophageal cancer Neg Hx   . Rectal cancer Neg Hx   . Stomach cancer Neg Hx     Past Surgical History:  Procedure Laterality Date  . COLONOSCOPY    . SHOULDER SURGERY      bilateral   Social History   Occupational History  . Not on file  Tobacco Use  . Smoking status: Never Smoker  . Smokeless tobacco: Never Used  Substance and Sexual Activity  . Alcohol use: Yes    Alcohol/week: 0.0 standard drinks    Comment: occ  . Drug use: No  . Sexual activity: Not on file

## 2021-01-17 ENCOUNTER — Encounter: Payer: BC Managed Care – PPO | Admitting: Orthopaedic Surgery

## 2021-01-30 ENCOUNTER — Other Ambulatory Visit: Payer: Self-pay | Admitting: Family Medicine

## 2021-02-05 ENCOUNTER — Other Ambulatory Visit: Payer: Self-pay | Admitting: Physician Assistant

## 2021-02-05 MED ORDER — ONDANSETRON HCL 4 MG PO TABS: 4.0000 mg | ORAL_TABLET | Freq: Three times a day (TID) | ORAL | 0 refills | Status: DC | PRN

## 2021-02-05 MED ORDER — HYDROCODONE-ACETAMINOPHEN 5-325 MG PO TABS: 1.0000 | ORAL_TABLET | Freq: Three times a day (TID) | ORAL | 0 refills | Status: DC | PRN

## 2021-02-07 ENCOUNTER — Encounter: Payer: Self-pay | Admitting: Orthopaedic Surgery

## 2021-02-07 ENCOUNTER — Telehealth: Payer: Self-pay | Admitting: Orthopaedic Surgery

## 2021-02-07 DIAGNOSIS — M94262 Chondromalacia, left knee: Secondary | ICD-10-CM | POA: Diagnosis not present

## 2021-02-07 DIAGNOSIS — M948X6 Other specified disorders of cartilage, lower leg: Secondary | ICD-10-CM | POA: Diagnosis not present

## 2021-02-07 DIAGNOSIS — X58XXXA Exposure to other specified factors, initial encounter: Secondary | ICD-10-CM | POA: Diagnosis not present

## 2021-02-07 DIAGNOSIS — Y999 Unspecified external cause status: Secondary | ICD-10-CM | POA: Diagnosis not present

## 2021-02-07 DIAGNOSIS — G8918 Other acute postprocedural pain: Secondary | ICD-10-CM | POA: Diagnosis not present

## 2021-02-07 DIAGNOSIS — S83232A Complex tear of medial meniscus, current injury, left knee, initial encounter: Secondary | ICD-10-CM | POA: Diagnosis not present

## 2021-02-07 DIAGNOSIS — S83242A Other tear of medial meniscus, current injury, left knee, initial encounter: Secondary | ICD-10-CM | POA: Diagnosis not present

## 2021-02-07 NOTE — Telephone Encounter (Signed)
Patient  wife  Eduardo Turner asked if patient can get a handicap placard. The number to contact patient is [phone]

## 2021-02-07 NOTE — Telephone Encounter (Signed)
If yes, for how long?

## 2021-02-07 NOTE — Telephone Encounter (Signed)
6 months

## 2021-02-09 ENCOUNTER — Emergency Department (HOSPITAL_BASED_OUTPATIENT_CLINIC_OR_DEPARTMENT_OTHER)
Admission: EM | Admit: 2021-02-09 | Discharge: 2021-02-09 | Disposition: A | Discharge status: Home / Self Care | Payer: BC Managed Care – PPO | Attending: Emergency Medicine | Admitting: Emergency Medicine

## 2021-02-09 ENCOUNTER — Other Ambulatory Visit: Payer: Self-pay

## 2021-02-09 ENCOUNTER — Emergency Department (HOSPITAL_BASED_OUTPATIENT_CLINIC_OR_DEPARTMENT_OTHER): Payer: BC Managed Care – PPO

## 2021-02-09 ENCOUNTER — Encounter (HOSPITAL_BASED_OUTPATIENT_CLINIC_OR_DEPARTMENT_OTHER): Payer: Self-pay

## 2021-02-09 DIAGNOSIS — R062 Wheezing: Secondary | ICD-10-CM | POA: Diagnosis not present

## 2021-02-09 DIAGNOSIS — K449 Diaphragmatic hernia without obstruction or gangrene: Secondary | ICD-10-CM | POA: Diagnosis not present

## 2021-02-09 DIAGNOSIS — J029 Acute pharyngitis, unspecified: Secondary | ICD-10-CM | POA: Diagnosis not present

## 2021-02-09 DIAGNOSIS — I10 Essential (primary) hypertension: Secondary | ICD-10-CM | POA: Insufficient documentation

## 2021-02-09 DIAGNOSIS — R0789 Other chest pain: Secondary | ICD-10-CM | POA: Diagnosis not present

## 2021-02-09 DIAGNOSIS — Z20822 Contact with and (suspected) exposure to covid-19: Secondary | ICD-10-CM | POA: Diagnosis not present

## 2021-02-09 DIAGNOSIS — R0602 Shortness of breath: Secondary | ICD-10-CM | POA: Diagnosis not present

## 2021-02-09 DIAGNOSIS — R059 Cough, unspecified: Secondary | ICD-10-CM | POA: Insufficient documentation

## 2021-02-09 DIAGNOSIS — R509 Fever, unspecified: Secondary | ICD-10-CM | POA: Diagnosis not present

## 2021-02-09 LAB — CBC WITH DIFFERENTIAL/PLATELET
Abs Immature Granulocytes: 0.05 10*3/uL (ref 0.00–0.07)
Basophils Absolute: 0 10*3/uL (ref 0.0–0.1)
Basophils Relative: 0 %
Eosinophils Absolute: 0.3 10*3/uL (ref 0.0–0.5)
Eosinophils Relative: 2 %
HCT: 46.3 % (ref 39.0–52.0)
Hemoglobin: 15.7 g/dL (ref 13.0–17.0)
Immature Granulocytes: 1 %
Lymphocytes Relative: 8 %
Lymphs Abs: 0.9 10*3/uL (ref 0.7–4.0)
MCH: 29.7 pg (ref 26.0–34.0)
MCHC: 33.9 g/dL (ref 30.0–36.0)
MCV: 87.7 fL (ref 80.0–100.0)
Monocytes Absolute: 0.7 10*3/uL (ref 0.1–1.0)
Monocytes Relative: 6 %
Neutro Abs: 9 10*3/uL — ABNORMAL HIGH (ref 1.7–7.7)
Neutrophils Relative %: 83 %
Platelets: 173 10*3/uL (ref 150–400)
RBC: 5.28 MIL/uL (ref 4.22–5.81)
RDW: 12.7 % (ref 11.5–15.5)
WBC: 10.9 10*3/uL — ABNORMAL HIGH (ref 4.0–10.5)
nRBC: 0 % (ref 0.0–0.2)

## 2021-02-09 LAB — COMPREHENSIVE METABOLIC PANEL
ALT: 28 U/L (ref 0–44)
AST: 23 U/L (ref 15–41)
Albumin: 4.3 g/dL (ref 3.5–5.0)
Alkaline Phosphatase: 54 U/L (ref 38–126)
Anion gap: 9 (ref 5–15)
BUN: 18 mg/dL (ref 6–20)
CO2: 27 mmol/L (ref 22–32)
Calcium: 8.9 mg/dL (ref 8.9–10.3)
Chloride: 101 mmol/L (ref 98–111)
Creatinine, Ser: 0.72 mg/dL (ref 0.61–1.24)
GFR, Estimated: >60 mL/min (ref >60)
Glucose, Bld: 98 mg/dL (ref 70–99)
Potassium: 3.7 mmol/L (ref 3.5–5.1)
Sodium: 137 mmol/L (ref 135–145)
Total Bilirubin: 1.6 mg/dL — ABNORMAL HIGH (ref 0.3–1.2)
Total Protein: 7.4 g/dL (ref 6.5–8.1)

## 2021-02-09 LAB — TROPONIN I (HIGH SENSITIVITY)
Troponin I (High Sensitivity): 4 ng/L (ref <18)
Troponin I (High Sensitivity): 4 ng/L (ref <18)

## 2021-02-09 LAB — RESP PANEL BY RT-PCR (FLU A&B, COVID) ARPGX2
Influenza A by PCR: NEGATIVE
Influenza B by PCR: NEGATIVE
SARS Coronavirus 2 by RT PCR: NEGATIVE

## 2021-02-09 MED ORDER — DEXAMETHASONE SODIUM PHOSPHATE 10 MG/ML IJ SOLN
10.0000 mg | Freq: Once | INTRAMUSCULAR | Status: AC
  Administered 2021-02-09: 10 mg via INTRAVENOUS
  Filled 2021-02-09: qty 1

## 2021-02-09 MED ORDER — ALBUTEROL SULFATE HFA 108 (90 BASE) MCG/ACT IN AERS: 1.0000 | INHALATION_SPRAY | Freq: Four times a day (QID) | RESPIRATORY_TRACT | 0 refills | Status: DC | PRN

## 2021-02-09 MED ORDER — ACETAMINOPHEN 500 MG PO TABS
1000.0000 mg | ORAL_TABLET | Freq: Once | ORAL | Status: AC
  Administered 2021-02-09: 1000 mg via ORAL
  Filled 2021-02-09: qty 2

## 2021-02-09 MED ORDER — IPRATROPIUM-ALBUTEROL 0.5-2.5 (3) MG/3ML IN SOLN
3.0000 mL | Freq: Once | RESPIRATORY_TRACT | Status: AC
  Administered 2021-02-09: 3 mL via RESPIRATORY_TRACT
  Filled 2021-02-09: qty 3

## 2021-02-09 MED ORDER — IOHEXOL 350 MG/ML SOLN
100.0000 mL | Freq: Once | INTRAVENOUS | Status: AC | PRN
  Administered 2021-02-09: 83 mL via INTRAVENOUS

## 2021-02-09 MED ORDER — ALBUTEROL SULFATE HFA 108 (90 BASE) MCG/ACT IN AERS
4.0000 | INHALATION_SPRAY | RESPIRATORY_TRACT | Status: DC | PRN
  Administered 2021-02-09: 4 via RESPIRATORY_TRACT
  Filled 2021-02-09: qty 6.7

## 2021-02-09 NOTE — ED Provider Notes (Signed)
Emergency Department Provider Note   I have reviewed the triage vital signs and the nursing notes.   HISTORY  Chief Complaint Shortness of Breath   HPI Eduardo Turner is a 59 y.o. male with past medical history reviewed below including left knee surgery last week presents to the emergency department with wheezing and shortness of breath along with cough and some sore throat.  Patient states that the procedure went on without immediate complication.  He had some sore throat after being under anesthesia with mild cough.  He has since developed wheezing.  He does have a very remote history of asthma but states he has not had wheezing episodes or needed to use an inhaler in the past 30 years.  He denies having pleuritic chest pain.  He does describe some "feeling tight" in the chest but no clear modifying factors.  With symptoms worsening he presents to the emergency department today for evaluation.   Past Medical History:  Diagnosis Date   Allergy    Asthma    Dysplastic nevi    GERD (gastroesophageal reflux disease)    Hypertension    Multiple lipomas    Peptic esophageal ulcer    Stroke ([place]) 10/30/2019    Patient Active Problem List   Diagnosis Date Noted   Acute medial meniscus tear, left, initial encounter 02/07/2021   History of adenomatous polyp of colon 03/19/2020   Dysarthria 01/03/2020   History of stroke 11/07/2019   Hyperlipidemia 07/21/2016   Ophthalmic migraine 09/20/2010   GERD 09/19/2009   Recurrent major depression in full remission ([place]) 06/21/2009   DYSPLASTIC NEVUS, BACK 02/05/2008   Lipoma 01/31/2008   ERECTILE DYSFUNCTION, MILD 03/17/2007   Essential hypertension 02/22/2007   Allergic rhinitis 02/22/2007   Asthma 02/22/2007    Past Surgical History:  Procedure Laterality Date   COLONOSCOPY     KNEE SURGERY Left    SHOULDER SURGERY     bilateral    Allergies Erythromycin ethylsuccinate  Family History  Problem Relation Age of Onset    Other Mother        routine surgery lead to sepsis- hysterectomy   Heart attack Father        age 25   Bipolar disorder Sister    Colon cancer Neg Hx    Esophageal cancer Neg Hx    Rectal cancer Neg Hx    Stomach cancer Neg Hx     Social History Social History   Tobacco Use   Smoking status: Never   Smokeless tobacco: Never  Vaping Use   Vaping Use: Never used  Substance Use Topics   Alcohol use: Yes    Alcohol/week: 0.0 standard drinks    Comment: occ   Drug use: No    Review of Systems  Constitutional: No fever/chills Eyes: No visual changes. ENT: Positive sore throat. Cardiovascular: Positive chest tightness.  Respiratory: Positive shortness of breath and wheezing.  Gastrointestinal: No abdominal pain.  No nausea, no vomiting.  No diarrhea.  No constipation. Genitourinary: Negative for dysuria. Musculoskeletal: Negative for back pain. Skin: Negative for rash. Neurological: Negative for headaches, focal weakness or numbness.  10-point ROS otherwise negative.  ____________________________________________   PHYSICAL EXAM:  VITAL SIGNS: ED Triage Vitals  Enc Vitals Group     BP 02/09/21 1648 (!) 156/99     Pulse Rate 02/09/21 1648 (!) 104     Resp 02/09/21 1648 (!) 24     Temp 02/09/21 1648 (!) 100.5 F (38.1 C)  Temp Source 02/09/21 1648 Oral     SpO2 02/09/21 1648 90 %     Weight 02/09/21 1649 235 lb (106.6 kg)     Height 02/09/21 1649 5\' 10"  (1.778 m)   Constitutional: Alert and oriented. Well appearing and in no acute distress. Eyes: Conjunctivae are normal. Head: Atraumatic. Nose: No congestion/rhinnorhea. Mouth/Throat: Mucous membranes are moist.  Oropharynx non-erythematous. Neck: No stridor.  Cardiovascular: Tachycardia. Good peripheral circulation. Grossly normal heart sounds.   Respiratory: Normal respiratory effort.  No retractions. Lungs with end-expiratory wheezing bilaterally.  Gastrointestinal: Soft and nontender. No distention.   Musculoskeletal: ACE wrap over left knee. No surrounding cellulitis.  Neurologic:  Normal speech and language. No gross focal neurologic deficits are appreciated.  Skin:  Skin is warm, dry and intact. No rash noted.   ____________________________________________   LABS (all labs ordered are listed, but only abnormal results are displayed)  Labs Reviewed  COMPREHENSIVE METABOLIC PANEL - Abnormal; Notable for the following components:      Result Value   Total Bilirubin 1.6 (*)    All other components within normal limits  CBC WITH DIFFERENTIAL/PLATELET - Abnormal; Notable for the following components:   WBC 10.9 (*)    Neutro Abs 9.0 (*)    All other components within normal limits  RESP PANEL BY RT-PCR (FLU A&B, COVID) ARPGX2  TROPONIN I (HIGH SENSITIVITY)  TROPONIN I (HIGH SENSITIVITY)   ____________________________________________  EKG   EKG Interpretation  Date/Time:  Saturday February 09 2021 16:48:06 EDT Ventricular Rate:  104 PR Interval:  129 QRS Duration: 101 QT Interval:  340 QTC Calculation: 448 R Axis:   -13 Text Interpretation: Sinus tachycardia Baseline wander in lead(s) V1 Confirmed by Nanda Quinton [id number]) on 02/09/2021 4:58:16 PM         ____________________________________________  RADIOLOGY  CT Angio Chest PE W and/or Wo Contrast  Result Date: 02/09/2021 CLINICAL DATA:  PE suspected, high prob Chest tightness. Knee surgery 2 days ago. Sore throat after anesthesia. EXAM: CT ANGIOGRAPHY CHEST WITH CONTRAST TECHNIQUE: Multidetector CT imaging of the chest was performed using the standard protocol during bolus administration of intravenous contrast. Multiplanar CT image reconstructions and MIPs were obtained to evaluate the vascular anatomy. CONTRAST:  67mL OMNIPAQUE IOHEXOL 350 MG/ML SOLN COMPARISON:  Radiograph earlier today. FINDINGS: Cardiovascular: Examination is diagnostic to the distal lobar/proximal segmental level. Distal segmental and subsegmental  branches cannot be assessed due to contrast bolus timing as well as patient motion artifact. There is no large central pulmonary embolus. More distal branches are not well assessed. There is excellent contrast opacification of the thoracic aorta which is normal in caliber without dissection or acute aortic findings. Conventional branching pattern from the aortic arch. Heart is normal in size. No pericardial effusion. Mediastinum/Nodes: No enlarged mediastinal or hilar lymph nodes. Tiny hiatal hernia, otherwise unremarkable esophagus. No thyroid nodule. Lungs/Pleura: Trachea and central bronchi are patent, there is mild bronchial thickening of the segmental bronchi. No retained secretions in the trachea or bronchial tree. No focal airspace disease. No findings of pulmonary edema. No pleural effusion. No pulmonary nodule or mass. Upper Abdomen: No acute or unexpected findings. Musculoskeletal: There are no acute or suspicious osseous abnormalities. Review of the MIP images confirms the above findings. IMPRESSION: 1. No central pulmonary embolus. Contrast bolus timing is suboptimal for assessment of distal segmental and subsegmental branches. 2. Mild bronchial thickening, can be seen with bronchitis or reactive airways disease. Focal airspace disease. 3. Tiny hiatal hernia. Electronically Signed  By: Keith Rake M.D.   On: 02/09/2021 18:46   DG Chest Portable 1 View  Result Date: 02/09/2021 CLINICAL DATA:  Shortness of breath and cough.  Fever. EXAM: PORTABLE CHEST 1 VIEW COMPARISON:  None. FINDINGS: The cardiomediastinal contours are normal. Mild bronchial thickening. Pulmonary vasculature is normal. No consolidation, pleural effusion, or pneumothorax. No acute osseous abnormalities are seen. IMPRESSION: Mild bronchial thickening.  No focal airspace disease. Electronically Signed   By: Keith Rake M.D.   On: 02/09/2021 17:32     ____________________________________________   PROCEDURES  Procedure(s) performed:   Procedures  None  ____________________________________________   INITIAL IMPRESSION / ASSESSMENT AND PLAN / ED COURSE  Pertinent labs & imaging results that were available during my care of the patient were reviewed by me and considered in my medical decision making (see chart for details).   Patient presents to the emergency department for evaluation of wheezing and shortness of breath along with sore throat.  He does have a fever on arrival to 100.5 with mild tachycardia.  He is not in respiratory distress but was started on 2 L nasal cannula oxygen with O2 sat of 90 on arrival.  PE is a consideration although presentation is atypical with wheezing and relative lack of chest pain although recent surgery does put him at risk.  We will send COVID and flu testing along with chest x-ray and labs.  EKG does not show acute ischemic change.  08:34 PM  Patient's lab work here is reassuring with delta troponins which are normal.  No evidence of pneumonia, pneumothorax, or other acute process on chest x-ray.  Patient is at high risk for PE and given his hypoxemia on arrival, chest tightness, no other clear explanation for symptoms I proceeded with a CTA of his chest.  This did not show PE or other acute process.  After additional nebulizer treatments and a single dose of Decadron the patient's symptoms improved and the oxygen was able to be weaned.  He is very well-appearing.  Will discharge home with an albuterol inhaler.  Patient instructed to call his PCP first thing on Tuesday for a follow-up appointment.  Discussed strict ED return precautions. ____________________________________________  FINAL CLINICAL IMPRESSION(S) / ED DIAGNOSES  Final diagnoses:  SOB (shortness of breath)  Wheezing  Feeling of chest tightness     MEDICATIONS GIVEN DURING THIS VISIT:  Medications  albuterol (VENTOLIN HFA) 108  (90 Base) MCG/ACT inhaler 4 puff (4 puffs Inhalation Given 02/09/21 1700)  acetaminophen (TYLENOL) tablet 1,000 mg (1,000 mg Oral Given 02/09/21 1704)  iohexol (OMNIPAQUE) 350 MG/ML injection 100 mL (83 mLs Intravenous Contrast Given 02/09/21 1818)  ipratropium-albuterol (DUONEB) 0.5-2.5 (3) MG/3ML nebulizer solution 3 mL (3 mLs Nebulization Given 02/09/21 1927)  dexamethasone (DECADRON) injection 10 mg (10 mg Intravenous Given 02/09/21 1947)     NEW OUTPATIENT MEDICATIONS STARTED DURING THIS VISIT:  New Prescriptions   ALBUTEROL (VENTOLIN HFA) 108 (90 BASE) MCG/ACT INHALER    Inhale 1-2 puffs into the lungs every 6 (six) hours as needed for wheezing or shortness of breath.    Note:  This document was prepared using Dragon voice recognition software and may include unintentional dictation errors.  Nanda Quinton, MD, [facility]    Deshae Dickison, [facility], MD 02/09/21 2035

## 2021-02-09 NOTE — Discharge Instructions (Addendum)
You were seen in the emergency room today with chest tightness.  Your lab work here is reassuring.  We looked for evidence of blood clots, infection, and injury to your heart.  Those are looking normal.  I am sending you home after giving a dose of steroid and will have you use an albuterol inhaler as needed if symptoms return.  Please make your primary doctor aware of your symptoms and schedule the next available follow-up appointment.  If you develop shortness of breath, chest tightness, other sudden/worsening symptoms please return to the emergency department immediately.

## 2021-02-09 NOTE — ED Notes (Signed)
Decreased patient O2 to 0. Repositioned patient in bed to a more upright position, instructed patient to deep breathe in-order to expand his lungs. With deep breathing patient oxygen saturations increased to 95%. Will continue to monitor and make necessary changes as needed.

## 2021-02-09 NOTE — ED Triage Notes (Signed)
Pt presents with 2 day history of productive cough and ShOB. Pt recently had outpatient surgery where he was on the ventilator and woke up with a scratchy throat.

## 2021-02-09 NOTE — ED Notes (Signed)
Patient flow O2 decreased to 2L. Oxygen saturations 93%. Will continue to monitor and make adjustments as necessary. Patient toleration well.,

## 2021-02-09 NOTE — ED Notes (Signed)
Patient transported to CT 

## 2021-02-12 NOTE — Telephone Encounter (Signed)
Completed and placed at the front desk. Pt was called and informed

## 2021-02-13 ENCOUNTER — Ambulatory Visit: Payer: BC Managed Care – PPO | Admitting: Family Medicine

## 2021-02-14 ENCOUNTER — Ambulatory Visit (INDEPENDENT_AMBULATORY_CARE_PROVIDER_SITE_OTHER): Payer: BC Managed Care – PPO | Admitting: Physician Assistant

## 2021-02-14 ENCOUNTER — Encounter: Payer: Self-pay | Admitting: Physician Assistant

## 2021-02-14 DIAGNOSIS — Z9889 Other specified postprocedural states: Secondary | ICD-10-CM

## 2021-02-14 NOTE — Progress Notes (Signed)
   Post-Op Visit Note   Patient: Eduardo Turner           Date of Birth: 07/30/1961           MRN: [id number] Visit Date: 02/14/2021 PCP: Marin Olp, MD   Assessment & Plan:  Chief Complaint:  Chief Complaint  Patient presents with   Left Knee - Pain   Visit Diagnoses:  1. S/P left knee arthroscopy     Plan: Patient is a pleasant 60 year old gentleman who comes in today 1 week out left knee arthroscopic debridement medial meniscus and chondroplasty, date of surgery 02/07/2021.  He has been doing well.  He denies any pain.  He has been taking over-the-counter medication for pain.  Examination of the left knee reveals to fully healed surgical portals with nylon sutures in place.  No evidence of infection or cellulitis.  Calf soft nontender.  He is neurovascular intact distally.  Today, sutures were removed and Steri-Strips applied.  Intraoperative pictures reviewed.  Range of motion/strengthening handout for the knee was provided to the patient today.  He will follow-up with [place] in 5 weeks time for repeat evaluation.  Call with concerns or questions in the meantime.  Follow-Up Instructions: Return in about 5 weeks (around 03/21/2021).   Orders:  No orders of the defined types were placed in this encounter.  No orders of the defined types were placed in this encounter.   Imaging: No new imaging  PMFS History: Patient Active Problem List   Diagnosis Date Noted   Acute medial meniscus tear, left, initial encounter 02/07/2021   History of adenomatous polyp of colon 03/19/2020   Dysarthria 01/03/2020   History of stroke 11/07/2019   Hyperlipidemia 07/21/2016   Ophthalmic migraine 09/20/2010   GERD 09/19/2009   Recurrent major depression in full remission ([place]) 06/21/2009   DYSPLASTIC NEVUS, BACK 02/05/2008   Lipoma 01/31/2008   ERECTILE DYSFUNCTION, MILD 03/17/2007   Essential hypertension 02/22/2007   Allergic rhinitis 02/22/2007   Asthma 02/22/2007   Past Medical  History:  Diagnosis Date   Allergy    Asthma    Dysplastic nevi    GERD (gastroesophageal reflux disease)    Hypertension    Multiple lipomas    Peptic esophageal ulcer    Stroke ([place]) 10/30/2019    Family History  Problem Relation Age of Onset   Other Mother        routine surgery lead to sepsis- hysterectomy   Heart attack Father        age 8   Bipolar disorder Sister    Colon cancer Neg Hx    Esophageal cancer Neg Hx    Rectal cancer Neg Hx    Stomach cancer Neg Hx     Past Surgical History:  Procedure Laterality Date   COLONOSCOPY     KNEE SURGERY Left    SHOULDER SURGERY     bilateral   Social History   Occupational History   Not on file  Tobacco Use   Smoking status: Never   Smokeless tobacco: Never  Vaping Use   Vaping Use: Never used  Substance and Sexual Activity   Alcohol use: Yes    Alcohol/week: 0.0 standard drinks    Comment: occ   Drug use: No   Sexual activity: Not on file

## 2021-03-18 ENCOUNTER — Telehealth: Payer: Self-pay | Admitting: Orthopaedic Surgery

## 2021-03-18 ENCOUNTER — Telehealth: Payer: Self-pay

## 2021-03-18 NOTE — Telephone Encounter (Signed)
[employer] is needing updated work note see message below.    10:14 AM] Kristeen Miss i have a return to work form for him. is he on any restrictions or can work without?  [10:14 AM] Kristeen Miss also when was he released?

## 2021-03-18 NOTE — Telephone Encounter (Signed)
Note made.  

## 2021-03-18 NOTE — Telephone Encounter (Signed)
Pt called and would like to know if he can come in sooner than 2 weeks to be released for his job. He was suppose to return to work Wednesday.  Please advise

## 2021-03-18 NOTE — Telephone Encounter (Signed)
Out of work until follow up appointment

## 2021-03-18 NOTE — Telephone Encounter (Signed)
Pt called stating he needs to have his work form to release him back to work with no restrictions signed and he dropped the form off last week. Pts last appt was 02/14/21 and he states he's had no problems, so he would like to know if he needs to come back in for another appt? If not pt would like to have this form filled out and emailed back to him; pt would like a CB when this is done.   [email or web address] [phone]

## 2021-03-19 NOTE — Telephone Encounter (Signed)
Ok to come in early, but also ok to allow him to go back to work then if he feels ready

## 2021-03-19 NOTE — Telephone Encounter (Signed)
If work comp, have him come back in.  Ok to go ahead and write note releasing back to work per previous message

## 2021-03-20 NOTE — Telephone Encounter (Signed)
Ok to do

## 2021-03-20 NOTE — Telephone Encounter (Signed)
Work note made. Emailed to patient also sent through [employer]

## 2021-03-20 NOTE — Telephone Encounter (Signed)
Called patient. States this is not W/C. He would like to RTW Monday 03/25/2021 and will follow up on the 24th as scheduled.   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 He states he left some paperwork for her to fill out.

## 2021-04-03 ENCOUNTER — Other Ambulatory Visit: Payer: Self-pay

## 2021-04-03 ENCOUNTER — Ambulatory Visit (INDEPENDENT_AMBULATORY_CARE_PROVIDER_SITE_OTHER): Payer: BC Managed Care – PPO | Admitting: Physician Assistant

## 2021-04-03 DIAGNOSIS — Z9889 Other specified postprocedural states: Secondary | ICD-10-CM

## 2021-04-03 NOTE — Progress Notes (Signed)
   Post-Op Visit Note   Patient: Eduardo Turner           Date of Birth: 10-25-1960           MRN: [id number] Visit Date: 04/03/2021 PCP: Marin Olp, MD   Assessment & Plan:  Chief Complaint:  Chief Complaint  Patient presents with   Left Knee - Pain   Visit Diagnoses:  1. S/P left knee arthroscopy     Plan: Patient is a very pleasant 60 year old gentleman who comes in today approximately 7 weeks status post left knee arthroscopic debridement medial meniscus 02/07/2021.  He has been doing very well.  He started back at work last week where he has been on his feet for majority of each day.  He has noticed some tightness and pain at the end of the day which is relieved with topical and oral anti-inflammatories.  Overall, he is very pleased with the outcome.  Examination of the left knee reveals fully healed surgical portals without complication.  Range of motion 0 to 130 degrees.  No calf pain.  He is neurovascular intact distally.  At this point, he will continue to advance with activity as tolerated.  Follow-up with [place] as needed.  Follow-Up Instructions: Return if symptoms worsen or fail to improve.   Orders:  No orders of the defined types were placed in this encounter.  No orders of the defined types were placed in this encounter.   Imaging: No new imaging  PMFS History: Patient Active Problem List   Diagnosis Date Noted   Acute medial meniscus tear, left, initial encounter 02/07/2021   History of adenomatous polyp of colon 03/19/2020   Dysarthria 01/03/2020   History of stroke 11/07/2019   Hyperlipidemia 07/21/2016   Ophthalmic migraine 09/20/2010   GERD 09/19/2009   Recurrent major depression in full remission ([place]) 06/21/2009   DYSPLASTIC NEVUS, BACK 02/05/2008   Lipoma 01/31/2008   ERECTILE DYSFUNCTION, MILD 03/17/2007   Essential hypertension 02/22/2007   Allergic rhinitis 02/22/2007   Asthma 02/22/2007   Past Medical History:  Diagnosis Date   Allergy     Asthma    Dysplastic nevi    GERD (gastroesophageal reflux disease)    Hypertension    Multiple lipomas    Peptic esophageal ulcer    Stroke ([place]) 10/30/2019    Family History  Problem Relation Age of Onset   Other Mother        routine surgery lead to sepsis- hysterectomy   Heart attack Father        age 20   Bipolar disorder Sister    Colon cancer Neg Hx    Esophageal cancer Neg Hx    Rectal cancer Neg Hx    Stomach cancer Neg Hx     Past Surgical History:  Procedure Laterality Date   COLONOSCOPY     KNEE SURGERY Left    SHOULDER SURGERY     bilateral   Social History   Occupational History   Not on file  Tobacco Use   Smoking status: Never   Smokeless tobacco: Never  Vaping Use   Vaping Use: Never used  Substance and Sexual Activity   Alcohol use: Yes    Alcohol/week: 0.0 standard drinks    Comment: occ   Drug use: No   Sexual activity: Not on file

## 2021-04-28 ENCOUNTER — Other Ambulatory Visit: Payer: Self-pay | Admitting: Family Medicine

## 2021-05-24 NOTE — Progress Notes (Signed)
Phone: [phone]   Subjective:  Patient presents today for their annual physical. Chief complaint-noted.   See problem oriented charting- Review of Systems  Constitutional:  Negative for chills and fever.  HENT:  Negative for hearing loss and tinnitus.   Eyes:  Negative for blurred vision and double vision.  Respiratory:  Negative for cough and hemoptysis.   Cardiovascular:  Negative for chest pain and palpitations.  Gastrointestinal:  Negative for abdominal pain, blood in stool, constipation, diarrhea, heartburn, melena, nausea and vomiting.  Genitourinary:  Negative for dysuria and frequency.  Musculoskeletal:  Positive for joint pain (some) and myalgias.  Skin:  Negative for itching and rash.  Neurological:  Negative for dizziness and headaches.  Endo/Heme/Allergies:  Negative for polydipsia. Does not bruise/bleed easily.  Psychiatric/Behavioral:  Negative for depression and suicidal ideas.    The following were reviewed and entered/updated in epic: Past Medical History:  Diagnosis Date   Allergy    Asthma    Dysplastic nevi    GERD (gastroesophageal reflux disease)    Hypertension    Multiple lipomas    Peptic esophageal ulcer    Stroke ([place]) 10/30/2019   Patient Active Problem List   Diagnosis Date Noted   History of stroke 11/07/2019    Priority: 1.   History of adenomatous polyp of colon 03/19/2020    Priority: 2.   Hyperlipidemia 07/21/2016    Priority: 2.   GERD 09/19/2009    Priority: 2.   Recurrent major depression in full remission ([place]) 06/21/2009    Priority: 2.   Essential hypertension 02/22/2007    Priority: 2.   Asthma 02/22/2007    Priority: 2.   Ophthalmic migraine 09/20/2010    Priority: 3.   DYSPLASTIC NEVUS, BACK 02/05/2008    Priority: 3.   Lipoma 01/31/2008    Priority: 3.   ERECTILE DYSFUNCTION, MILD 03/17/2007    Priority: 3.   Allergic rhinitis 02/22/2007    Priority: 3.   Acute medial meniscus tear, left, initial encounter  02/07/2021   Dysarthria 01/03/2020   Past Surgical History:  Procedure Laterality Date   COLONOSCOPY     KNEE SURGERY Left    SHOULDER SURGERY     bilateral    Family History  Problem Relation Age of Onset   Other Mother        routine surgery lead to sepsis- hysterectomy   Heart attack Father        age 1   Bipolar disorder Sister    Colon cancer Neg Hx    Esophageal cancer Neg Hx    Rectal cancer Neg Hx    Stomach cancer Neg Hx     Medications- reviewed and updated Current Outpatient Medications  Medication Sig Dispense Refill   albuterol (VENTOLIN HFA) 108 (90 Base) MCG/ACT inhaler Inhale 1-2 puffs into the lungs every 6 (six) hours as needed for wheezing or shortness of breath. 6.7 g 0   citalopram (CELEXA) 10 MG tablet Take 1 tablet (10 mg total) by mouth daily. 90 tablet 3   famotidine (PEPCID) 20 MG tablet Take 1 tablet (20 mg total) by mouth 2 (two) times daily. 60 tablet 5   lisinopril (ZESTRIL) 40 MG tablet TAKE 1 TABLET BY MOUTH  DAILY 90 tablet 3   loratadine (CLARITIN) 10 MG tablet Take 10 mg by mouth daily.     omeprazole (PRILOSEC) 20 MG capsule Take 1 capsule (20 mg total) by mouth daily. 90 capsule 3   rosuvastatin (CRESTOR) 40  MG tablet TAKE 1 TABLET(40 MG) BY MOUTH DAILY 90 tablet 3   No current facility-administered medications for this visit.    Allergies-reviewed and updated Allergies  Allergen Reactions   Erythromycin Ethylsuccinate     REACTION: HIVES    Social History   Social History Narrative   Married. 2 children both out of house. Granddaughter 6 and grandson 2 in 09/2019.       Radio broadcast assistant at [employer].    Objective  Objective:  BP 104/70 (BP Location: Left Arm, Patient Position: Sitting, Cuff Size: Normal)   Pulse 71   Temp 98 F (36.7 C) (Temporal)   Ht 5\' 10"  (1.778 m)   Wt 233 lb 6.4 oz (105.9 kg)   SpO2 97%   BMI 33.49 kg/m  Gen: NAD, resting comfortably HEENT: Mucous membranes are moist. Oropharynx normal Neck:  no thyromegaly CV: RRR no murmurs rubs or gallops Lungs: CTAB no crackles, wheeze, rhonchi Abdomen: soft/nontender/nondistended/normal bowel sounds. No rebound or guarding.  Ext: no edema Skin: warm, dry Neuro: grossly normal, moves all extremities, PERRLA   Assessment and Plan  60 y.o. male presenting for annual physical.  Health Maintenance counseling: 1. Anticipatory guidance: Patient counseled regarding regular dental exams -q6 months, eye exams- yes yearly,  avoiding smoking and second hand smoke , limiting alcohol to 2 beverages per day. No illicit drugs.   2. Risk factor reduction:  Advised patient of need for regular exercise and diet rich and fruits and vegetables to reduce risk of heart attack and stroke. Exercise- goal 150 minutes a week- bowling , active at work.  Diet-eating at home more and trying to eat healthier diet-weight down 5 pounds from last year - had got up over 250 after knee surgery- doing a lot of fresh fruits instead of honey buns Wt Readings from Last 3 Encounters:  05/31/21 233 lb 6.4 oz (105.9 kg)  02/09/21 235 lb (106.6 kg)  12/18/20 239 lb (108.4 kg)  3. Immunizations/screenings/ancillary studies DISCUSSED:  -Shingrix vaccination #1 - declines for now  -COVID booster vac - consider bivalent at pharmacy -Flu vaccination (last one 10/21) - today  -prevnar 20 next visit potentially Immunization History  Administered Date(s) Administered   Influenza Whole 05/08/2008, 06/21/2009   Influenza,inj,Quad PF,6+ Mos 05/25/2018, 06/01/2019, 05/11/2020   Influenza-Unspecified 06/11/2016   PFIZER(Purple Top)SARS-COV-2 Vaccination 12/02/2019, 12/26/2019   Td 08/11/2002   Tdap 10/16/2014  4. Prostate cancer screening- defer rectal exam unless PSA trend concerning-update PSA today with labs  Lab Results  Component Value Date   PSA 0.67 10/11/2019   PSA 2.02 09/13/2019   PSA 0.48 07/23/2018   5. Colon cancer screening - colonoscopy 7/30/21with 3-year repeat planned  due to polyps 6. Skin cancer screening-no dermatologist.  advised regular sunscreen use. Denies worrisome, changing, or new skin lesions.  7. Smoking associated screening (lung cancer screening, AAA screen 65-75, UA)- Never smoke 8. STD screening -monogamous   Status of chronic or acute concerns   # ED visit for SOB S:patient was seen by [facility] ED on 02/09/21 for an evaluation of shortness of breath along with cough and some sore throat and wheezing. Pt reported after left knee surgery the wheezing developed. However he has an hx of asthma but stated he had not had wheezing episodes or needed an inhaler in the past 30 years. Denied pleuritic chest pains. He did describe some "feeling tight" in the chest but no clear modifying factors.  Delta troponins were negative/normal.  Chest x-ray without acute  process.  CT angiogram without PE-he improved with Decadron and nebulizer enough to be discharged home -Given albuterol hfa, tylenol 1000 mg, omnipaque, decadron and duoneb.  A/P: this was all an asthma exacerbation- see asthma discussion below    #history of CVA 10/2019 cared for by [facility](dysarthria)- LDL goal under 70- very little lingering issues #hyperlipidemia S: Medication: Rosuvastatin 40Mg  daily , asa 81mg  daily Lab Results  Component Value Date   CHOL 142 12/19/2019   HDL 35.80 (L) 12/19/2019   LDLCALC 83 12/19/2019   LDLDIRECT 64 05/11/2020   TRIG 116.0 12/19/2019   CHOLHDL 4 12/19/2019  A/P: LDL goal 70 or less with history of stroke-was at goal last visit-update with labs today   # Depression S: Medication:Celexa 10Mg  daily  Depression screen [facility] 2/9 05/31/2021 05/11/2020 05/11/2020  Decreased Interest 0 0 0  Down, Depressed, Hopeless 0 0 0  PHQ - 2 Score 0 0 0  Altered sleeping 1 1 -  Tired, decreased energy 1 0 -  Change in appetite 0 1 -  Feeling bad or failure about yourself  0 0 -  Trouble concentrating 0 1 -  Moving slowly or fidgety/restless 0 0 -  Suicidal  thoughts 0 0 -  PHQ-9 Score 2 3 -  Difficult doing work/chores - Not difficult at all -  A/P: Full remission-continue current medication   # Asthma S: Maintenance Medication: None As needed medication: Albuterol.   A/P: Had 1 exacerbation (over 30 years since last one) but overall doing well-discussed possibly having Symbicort on hand- he declines    # GERD S:Medication: omeprazole  A/P: failed pepcid- will continue prilosec- check b12 with long term ppi use   #hypertension S: medication: Lisinopril 40Mg  daily Home readings #s: 115-120/80 at home BP Readings from Last 3 Encounters:  05/31/21 104/70  02/09/21 (!) 105/93  12/18/20 110/82  A/P: Controlled. Continue current medications.   Recommended follow up: Return in about 1 year (around 05/31/2022) for physical or sooner if needed.  Lab/Order associations:  fasting   ICD-10-CM   1. Preventative health care  Z00.00 CBC with Differential/Platelet    Comprehensive metabolic panel    Lipid panel    PSA    Vitamin B12    2. Hyperlipidemia, unspecified hyperlipidemia type  E78.5 CBC with Differential/Platelet    Comprehensive metabolic panel    Lipid panel    3. Essential hypertension  I10     4. History of stroke  Z86.73     5. Depression, major, recurrent, in partial remission ([place])  F33.41     6. Gastroesophageal reflux disease without esophagitis  K21.9     7. Mild intermittent asthma without complication  W10.27     8. Routine general medical examination at a health care facility  Z00.00     9. Need for immunization against influenza  Z23     10. Screening for prostate cancer  Z12.5 PSA    11. High risk medication use  Z79.899 Vitamin B12      No orders of the defined types were placed in this encounter.  I,Jada Bradford,acting as a scribe for Garret Reddish, MD.,have documented all relevant documentation on the behalf of Garret Reddish, MD,as directed by  Garret Reddish, MD while in the presence of Garret Reddish, MD.  I, Garret Reddish, MD, have reviewed all documentation for this visit. The documentation on 05/31/21 for the exam, diagnosis, procedures, and orders are all accurate and complete.   Return precautions advised.  Garret Reddish, MD

## 2021-05-31 ENCOUNTER — Ambulatory Visit (INDEPENDENT_AMBULATORY_CARE_PROVIDER_SITE_OTHER): Payer: BC Managed Care – PPO | Admitting: Family Medicine

## 2021-05-31 ENCOUNTER — Encounter: Payer: Self-pay | Admitting: Family Medicine

## 2021-05-31 ENCOUNTER — Other Ambulatory Visit: Payer: Self-pay

## 2021-05-31 VITALS — BP 104/70 | HR 71 | Temp 98.0°F | Ht 70.0 in | Wt 233.4 lb

## 2021-05-31 DIAGNOSIS — Z125 Encounter for screening for malignant neoplasm of prostate: Secondary | ICD-10-CM

## 2021-05-31 DIAGNOSIS — Z0000 Encounter for general adult medical examination without abnormal findings: Secondary | ICD-10-CM

## 2021-05-31 DIAGNOSIS — Z23 Encounter for immunization: Secondary | ICD-10-CM

## 2021-05-31 DIAGNOSIS — Z8673 Personal history of transient ischemic attack (TIA), and cerebral infarction without residual deficits: Secondary | ICD-10-CM | POA: Diagnosis not present

## 2021-05-31 DIAGNOSIS — Z79899 Other long term (current) drug therapy: Secondary | ICD-10-CM

## 2021-05-31 DIAGNOSIS — I10 Essential (primary) hypertension: Secondary | ICD-10-CM

## 2021-05-31 DIAGNOSIS — F3341 Major depressive disorder, recurrent, in partial remission: Secondary | ICD-10-CM

## 2021-05-31 DIAGNOSIS — K219 Gastro-esophageal reflux disease without esophagitis: Secondary | ICD-10-CM

## 2021-05-31 DIAGNOSIS — E785 Hyperlipidemia, unspecified: Secondary | ICD-10-CM

## 2021-05-31 DIAGNOSIS — J4520 Mild intermittent asthma, uncomplicated: Secondary | ICD-10-CM

## 2021-05-31 NOTE — Patient Instructions (Addendum)
Health Maintenance Due  Topic Date Due   Pneumococcal Vaccine 36-59 Years old (1 - PCV)- Hold off OEUMPNT-61 shot at this time.   Never done   Zoster Vaccines- Shingrix (1 of 2) - Hold off shingles shot at this time.   Never done   COVID-19 Vaccine (3 - Pfizer risk series) - Please schedule new bivalent booster shot at your local pharmacy in the next 3 weeks.  01/23/2020   INFLUENZA VACCINE - Regular dose shot done in office.  03/11/2021   Please stop by lab before you go If you have mychart- we will send your results within 3 business days of [place] receiving them.  If you do not have mychart- we will call you about results within 5 business days of [place] receiving them.  *please also note that you will see labs on mychart as soon as they post. I will later go in and write notes on them- will say "notes from Dr. Yong Channel"  Recommended follow up: Return in about 1 year (around 05/31/2022) for physical or sooner if needed.

## 2021-05-31 NOTE — Addendum Note (Signed)
Addended by: Loura Back on: 05/31/2021 03:09 PM   Modules accepted: Orders

## 2021-06-01 LAB — COMPREHENSIVE METABOLIC PANEL
AG Ratio: 1.9 (calc) (ref 1.0–2.5)
ALT: 24 U/L (ref 9–46)
AST: 26 U/L (ref 10–35)
Albumin: 4.7 g/dL (ref 3.6–5.1)
Alkaline phosphatase (APISO): 56 U/L (ref 35–144)
BUN: 24 mg/dL (ref 7–25)
CO2: 26 mmol/L (ref 20–32)
Calcium: 9.6 mg/dL (ref 8.6–10.3)
Chloride: 104 mmol/L (ref 98–110)
Creat: 0.84 mg/dL (ref 0.70–1.30)
Globulin: 2.5 g/dL (calc) (ref 1.9–3.7)
Glucose, Bld: 77 mg/dL (ref 65–99)
Potassium: 3.9 mmol/L (ref 3.5–5.3)
Sodium: 141 mmol/L (ref 135–146)
Total Bilirubin: 2 mg/dL — ABNORMAL HIGH (ref 0.2–1.2)
Total Protein: 7.2 g/dL (ref 6.1–8.1)

## 2021-06-01 LAB — LIPID PANEL
Cholesterol: 114 mg/dL (ref <200)
HDL: 43 mg/dL (ref >=40)
LDL Cholesterol (Calc): 55 mg/dL (calc)
Non-HDL Cholesterol (Calc): 71 mg/dL (calc) (ref <130)
Total CHOL/HDL Ratio: 2.7 (calc) (ref <5.0)
Triglycerides: 81 mg/dL (ref <150)

## 2021-06-01 LAB — CBC WITH DIFFERENTIAL/PLATELET
Absolute Monocytes: 449 cells/uL (ref 200–950)
Basophils Absolute: 20 cells/uL (ref 0–200)
Basophils Relative: 0.3 %
Eosinophils Absolute: 132 cells/uL (ref 15–500)
Eosinophils Relative: 2 %
HCT: 47.9 % (ref 38.5–50.0)
Hemoglobin: 16 g/dL (ref 13.2–17.1)
Lymphs Abs: 1954 cells/uL (ref 850–3900)
MCH: 29.1 pg (ref 27.0–33.0)
MCHC: 33.4 g/dL (ref 32.0–36.0)
MCV: 87.1 fL (ref 80.0–100.0)
MPV: 9.6 fL (ref 7.5–12.5)
Monocytes Relative: 6.8 %
Neutro Abs: 4046 cells/uL (ref 1500–7800)
Neutrophils Relative %: 61.3 %
Platelets: 219 10*3/uL (ref 140–400)
RBC: 5.5 10*6/uL (ref 4.20–5.80)
RDW: 12.3 % (ref 11.0–15.0)
Total Lymphocyte: 29.6 %
WBC: 6.6 10*3/uL (ref 3.8–10.8)

## 2021-06-01 LAB — PSA: PSA: 2.39 ng/mL (ref <=4.00)

## 2021-06-01 LAB — VITAMIN B12: Vitamin B-12: 380 pg/mL (ref 200–1100)

## 2021-06-03 ENCOUNTER — Other Ambulatory Visit: Payer: Self-pay

## 2021-06-03 DIAGNOSIS — R972 Elevated prostate specific antigen [PSA]: Secondary | ICD-10-CM

## 2021-06-04 ENCOUNTER — Encounter: Payer: Self-pay | Admitting: Family Medicine

## 2021-06-06 MED ORDER — SILDENAFIL CITRATE 100 MG PO TABS: 100.0000 mg | ORAL_TABLET | Freq: Every day | ORAL | 11 refills | Status: DC | PRN

## 2021-06-06 MED ORDER — CITALOPRAM HYDROBROMIDE 10 MG PO TABS: 10.0000 mg | ORAL_TABLET | Freq: Every day | ORAL | 3 refills | Status: DC

## 2021-07-16 DIAGNOSIS — Z20822 Contact with and (suspected) exposure to covid-19: Secondary | ICD-10-CM | POA: Diagnosis not present

## 2021-08-15 ENCOUNTER — Other Ambulatory Visit: Payer: Self-pay | Admitting: Family Medicine

## 2021-10-22 ENCOUNTER — Encounter: Payer: Self-pay | Admitting: Family Medicine

## 2021-10-23 ENCOUNTER — Ambulatory Visit (INDEPENDENT_AMBULATORY_CARE_PROVIDER_SITE_OTHER): Payer: BC Managed Care – PPO | Admitting: Family Medicine

## 2021-10-23 ENCOUNTER — Encounter: Payer: Self-pay | Admitting: Family Medicine

## 2021-10-23 VITALS — BP 128/78 | HR 100 | Temp 97.8°F | Ht 70.0 in | Wt 239.2 lb

## 2021-10-23 DIAGNOSIS — M545 Low back pain, unspecified: Secondary | ICD-10-CM | POA: Diagnosis not present

## 2021-10-23 DIAGNOSIS — I10 Essential (primary) hypertension: Secondary | ICD-10-CM | POA: Diagnosis not present

## 2021-10-23 DIAGNOSIS — E785 Hyperlipidemia, unspecified: Secondary | ICD-10-CM | POA: Diagnosis not present

## 2021-10-23 DIAGNOSIS — R509 Fever, unspecified: Secondary | ICD-10-CM

## 2021-10-23 LAB — POC COVID19 BINAXNOW: SARS Coronavirus 2 Ag: NEGATIVE

## 2021-10-23 LAB — POCT INFLUENZA A/B
Influenza A, POC: NEGATIVE
Influenza B, POC: NEGATIVE

## 2021-10-23 MED ORDER — CYCLOBENZAPRINE HCL 10 MG PO TABS: 10.0000 mg | ORAL_TABLET | Freq: Three times a day (TID) | ORAL | 0 refills | Status: DC | PRN

## 2021-10-23 NOTE — Progress Notes (Signed)
? ?Phone [phone] ?In person visit ?  ?Subjective:  ? ?Eduardo Turner is a 61 y.o. year old very pleasant male patient who presents for/with See problem oriented charting ?Chief Complaint  ?Patient presents with  ? Back Pain  ?  Pt c/o back pain that started yesterday and would like an rx for Flexeril. Has used voltaren gel but has not helped, feels like a spasms going across his back. Pt would like work note.  ? bilateral ear pain  ?  Pt c/o bilateral ear and jaw pain that started last night.  ? dry mouth  ?  Pt states he has been drinking a ton of water and this started with the back spasms.  ? ? ?This visit occurred during the SARS-CoV-2 public health emergency.  Safety protocols were in place, including screening questions prior to the visit, additional usage of staff PPE, and extensive cleaning of exam room while observing appropriate contact time as indicated for disinfecting solutions.  ? ?Past Medical History-  ?Patient Active Problem List  ? Diagnosis Date Noted  ? History of stroke 11/07/2019  ?  Priority: High  ? History of adenomatous polyp of colon 03/19/2020  ?  Priority: Medium   ? Hyperlipidemia 07/21/2016  ?  Priority: Medium   ? GERD 09/19/2009  ?  Priority: Medium   ? Recurrent major depression in full remission ([place]) 06/21/2009  ?  Priority: Medium   ? Essential hypertension 02/22/2007  ?  Priority: Medium   ? Asthma 02/22/2007  ?  Priority: Medium   ? Ophthalmic migraine 09/20/2010  ?  Priority: Low  ? DYSPLASTIC NEVUS, BACK 02/05/2008  ?  Priority: Low  ? Lipoma 01/31/2008  ?  Priority: Low  ? ERECTILE DYSFUNCTION, MILD 03/17/2007  ?  Priority: Low  ? Allergic rhinitis 02/22/2007  ?  Priority: Low  ? Acute medial meniscus tear, left, initial encounter 02/07/2021  ? Dysarthria 01/03/2020  ? ? ?Medications- reviewed and updated ?Current Outpatient Medications  ?Medication Sig Dispense Refill  ? albuterol (VENTOLIN HFA) 108 (90 Base) MCG/ACT inhaler Inhale 1-2 puffs into the lungs every 6  (six) hours as needed for wheezing or shortness of breath. 6.7 g 0  ? citalopram (CELEXA) 10 MG tablet Take 1 tablet (10 mg total) by mouth daily. 90 tablet 3  ? famotidine (PEPCID) 20 MG tablet Take 1 tablet (20 mg total) by mouth 2 (two) times daily. 60 tablet 5  ? lisinopril (ZESTRIL) 40 MG tablet TAKE 1 TABLET BY MOUTH  DAILY 90 tablet 3  ? loratadine (CLARITIN) 10 MG tablet Take 10 mg by mouth daily.    ? omeprazole (PRILOSEC) 20 MG capsule Take 1 capsule (20 mg total) by mouth daily. 90 capsule 3  ? rosuvastatin (CRESTOR) 40 MG tablet TAKE 1 TABLET(40 MG) BY MOUTH DAILY 90 tablet 3  ? sildenafil (VIAGRA) 100 MG tablet Take 1 tablet (100 mg total) by mouth daily as needed for erectile dysfunction. 10 tablet 11  ? ?No current facility-administered medications for this visit.  ? ?  ?Objective:  ?BP 128/78   Pulse (!) 111   Temp 97.8 ?F (36.6 ?C)   Ht '5\' 10"'$  (1.778 m)   Wt 239 lb 3.2 oz (108.5 kg)   SpO2 95%   BMI 34.32 kg/m?  ?Gen: NAD, resting comfortably ?Nasal turbinates slightly edematous-with some yellow discharge in the left nostril.  Oropharynx largely normal.  Has some tender cervical lymphadenopathy just behind the jaw and below the ear. ?CV:  RRR no murmurs rubs or gallops ?Lungs: CTAB no crackles, wheeze, rhonchi ?Abdomen: soft/nontender/nondistended/normal bowel sounds.  ?Ext: no edema ?Back - Normal skin, Spine with normal alignment and no deformity.  No tenderness to vertebral process palpation.  Paraspinous muscles are tender and with spasm on the left- som eof this extending from lumbar up to lower thoracic.   Range of motion is full at neck. Limited due to pain in lumbar sacral regions. Negative Straight leg raise but on left causes pain in the back ?Neuro- no saddle anesthesia, 5/5 strength lower extremities ?  ? ?Assessment and Plan  ? ?# lower bilateral back pain  ?S: Patient started with back pain Monday but very slight- yesterday significantly worsened from 2 up to 9/10 and today is now  a 10/10.  He has tried Voltaren gel without relief.  Feels like spasm across his lower back. Has had just a few shooting pains into the right leg but mainly just hurts in low back. Does get some pain laying down and has been hard to sleep- but pain was worse when got up and moving today. Has tried aleve and vicks sinus.  ? ?He feels like his mouth is dry and has been drinking a lot of water since his back pain started. Patient has also noted pain below both the ears and back of both jaws starting last night.  Denies chest pain or shortness of breath. Subjective fever felt better with tylenol. Some mild cough and congestion for about a week- thought was allergies but fever didn't start until yesterday. No known meningitis contacts and has good ROM of neck without pain ? ?Intermittent back pain in past but nothing like this. Flexeril has helped in past ? ?ROS-No saddle anesthesia, bladder incontinence, fecal incontinence, weakness in extremity, numbness or tingling in extremity. History negative for trauma/falls,  history of cancer, fever, chills, unintentional weight loss, recent bacterial infection, recent IV drug use, HIV.  ?A/P: 61 year old male with new severe lower back pain with spasms starting yesterday in the setting of mild back pain for a few days and some mild cough and congestion for a week also associated with fever within the last 24 hours though subjective. ?- Swab for flu and COVID-discussed sometimes viral illnesses can cause muscle pain/back pain ?- trial flexeril for muscle spasms ?-Prefer to avoid NSAIDs with stroke history- stick with tylenol ?--Advised trial of ice or heat 15 to 20 minutes 3-4 times a day ?-Follow-up for new or worsening symptoms- asked him to update me on Monday with his progress ? ?#hypertension ?S: medication: lisinopril 40 mg  ?BP Readings from Last 3 Encounters:  ?10/23/21 128/78  ?05/31/21 104/70  ?02/09/21 (!) 105/93  ?A/P: blood pressure well controlled- continue current  meds- discussed holding lisinopril if gets dehydrated though ? ?#hyperlipidemia with stroke history ?S: Medication:Crestor 40 mg ?Lab Results  ?Component Value Date  ? CHOL 114 05/31/2021  ? HDL 43 05/31/2021  ? [place] 55 05/31/2021  ? LDLDIRECT 64 05/11/2020  ? TRIG 81 05/31/2021  ? CHOLHDL 2.7 05/31/2021  ? A/P: Controlled with LDL under 70 with stroke history.  Continue current medications. Considered holding due to pain but do not think primarily statin related.  ? ?Recommended follow up: No follow-ups on file. ?Future Appointments  ?Date Time Provider [place]  ?06/03/2022  9:40 AM Yong Channel, Brayton Mars, MD [facility]  ? ? ?Lab/Order associations: ?  ICD-10-CM   ?1. Essential hypertension  I10   ?  ?2. Hyperlipidemia, unspecified  hyperlipidemia type  E78.5   ?  ? ? ?No orders of the defined types were placed in this encounter. ? ?I,Jada Bradford,acting as a scribe for Garret Reddish, MD.,have documented all relevant documentation on the behalf of Garret Reddish, MD,as directed by  Garret Reddish, MD while in the presence of Garret Reddish, MD. ? ?I, Garret Reddish, MD, have reviewed all documentation for this visit. The documentation on 10/23/21 for the exam, diagnosis, procedures, and orders are all accurate and complete. ? ?Return precautions advised.  ?Garret Reddish, MD ? ? ?

## 2021-10-23 NOTE — Patient Instructions (Addendum)
Team please swab for covid and flu- had subjective fever last night.  ? ? new severe lower back pain starting yesterday in the setting of mild back pain for a few days and some mild cough and congestion for a week also associated with fever within the last 24 hours though subjective. ?- Swab for flu and COVID-discussed sometimes viral illnesses can cause muscle pain/back pain ?- trial flexeril for muscle spasms ?-Prefer to avoid NSAIDs with stroke history- stick with tylenol ?--Advised trial of ice or heat 15 to 20 minutes 3-4 times a day ?-Follow-up for new or worsening symptoms ? ?Team please give him a work note to return on Monday- that would be only if no fever for 24 hours- needs to check with thermometer and if covid and flu negative and he is improving ? ?Recommended follow up: Return for as needed for new, worsening, persistent symptoms. ?

## 2021-10-23 NOTE — Addendum Note (Signed)
Addended by: Clyde Lundborg A on: 10/23/2021 10:11 AM ? ? Modules accepted: Orders ? ?

## 2021-10-24 DIAGNOSIS — M542 Cervicalgia: Secondary | ICD-10-CM | POA: Diagnosis not present

## 2021-10-24 DIAGNOSIS — L03811 Cellulitis of head [any part, except face]: Secondary | ICD-10-CM | POA: Diagnosis not present

## 2021-10-24 DIAGNOSIS — J069 Acute upper respiratory infection, unspecified: Secondary | ICD-10-CM | POA: Diagnosis not present

## 2021-10-24 DIAGNOSIS — E782 Mixed hyperlipidemia: Secondary | ICD-10-CM | POA: Diagnosis not present

## 2021-10-24 DIAGNOSIS — I10 Essential (primary) hypertension: Secondary | ICD-10-CM | POA: Diagnosis not present

## 2021-10-24 DIAGNOSIS — Z20822 Contact with and (suspected) exposure to covid-19: Secondary | ICD-10-CM | POA: Diagnosis not present

## 2021-10-24 DIAGNOSIS — K449 Diaphragmatic hernia without obstruction or gangrene: Secondary | ICD-10-CM | POA: Diagnosis not present

## 2021-10-24 DIAGNOSIS — M545 Low back pain, unspecified: Secondary | ICD-10-CM | POA: Diagnosis not present

## 2021-10-24 DIAGNOSIS — R220 Localized swelling, mass and lump, head: Secondary | ICD-10-CM | POA: Diagnosis not present

## 2021-10-24 DIAGNOSIS — A419 Sepsis, unspecified organism: Secondary | ICD-10-CM | POA: Diagnosis not present

## 2021-10-24 DIAGNOSIS — D72829 Elevated white blood cell count, unspecified: Secondary | ICD-10-CM | POA: Diagnosis not present

## 2021-10-24 DIAGNOSIS — K219 Gastro-esophageal reflux disease without esophagitis: Secondary | ICD-10-CM | POA: Diagnosis not present

## 2021-10-24 DIAGNOSIS — R6520 Severe sepsis without septic shock: Secondary | ICD-10-CM | POA: Diagnosis not present

## 2021-10-24 DIAGNOSIS — Z8673 Personal history of transient ischemic attack (TIA), and cerebral infarction without residual deficits: Secondary | ICD-10-CM | POA: Diagnosis not present

## 2021-10-24 DIAGNOSIS — F418 Other specified anxiety disorders: Secondary | ICD-10-CM | POA: Diagnosis not present

## 2021-10-24 DIAGNOSIS — I517 Cardiomegaly: Secondary | ICD-10-CM | POA: Diagnosis not present

## 2021-10-24 DIAGNOSIS — E876 Hypokalemia: Secondary | ICD-10-CM | POA: Diagnosis not present

## 2021-10-24 DIAGNOSIS — R519 Headache, unspecified: Secondary | ICD-10-CM | POA: Diagnosis not present

## 2021-10-24 DIAGNOSIS — R059 Cough, unspecified: Secondary | ICD-10-CM | POA: Diagnosis not present

## 2021-10-24 DIAGNOSIS — R000 Tachycardia, unspecified: Secondary | ICD-10-CM | POA: Diagnosis not present

## 2021-10-24 DIAGNOSIS — E871 Hypo-osmolality and hyponatremia: Secondary | ICD-10-CM | POA: Diagnosis not present

## 2021-10-24 DIAGNOSIS — L0203 Carbuncle of face: Secondary | ICD-10-CM | POA: Diagnosis not present

## 2021-10-24 DIAGNOSIS — G894 Chronic pain syndrome: Secondary | ICD-10-CM | POA: Diagnosis not present

## 2021-10-24 DIAGNOSIS — L03211 Cellulitis of face: Secondary | ICD-10-CM | POA: Diagnosis not present

## 2021-10-24 DIAGNOSIS — R509 Fever, unspecified: Secondary | ICD-10-CM | POA: Diagnosis not present

## 2021-10-24 DIAGNOSIS — G936 Cerebral edema: Secondary | ICD-10-CM | POA: Diagnosis not present

## 2021-10-24 NOTE — Progress Notes (Signed)
I was alerted the next day that patient had developed a rash on his face and very stiff neck- with covid and flu negative recommended ER visit for further evaluation with risk of meningitis a greater concern now.  ?

## 2021-10-31 ENCOUNTER — Encounter: Payer: Self-pay | Admitting: Family Medicine

## 2021-10-31 ENCOUNTER — Ambulatory Visit (INDEPENDENT_AMBULATORY_CARE_PROVIDER_SITE_OTHER): Payer: BC Managed Care – PPO | Admitting: Family Medicine

## 2021-10-31 VITALS — BP 128/72 | HR 74 | Temp 97.8°F | Ht 70.0 in | Wt 237.8 lb

## 2021-10-31 DIAGNOSIS — K760 Fatty (change of) liver, not elsewhere classified: Secondary | ICD-10-CM | POA: Diagnosis not present

## 2021-10-31 DIAGNOSIS — I10 Essential (primary) hypertension: Secondary | ICD-10-CM | POA: Diagnosis not present

## 2021-10-31 DIAGNOSIS — F3342 Major depressive disorder, recurrent, in full remission: Secondary | ICD-10-CM

## 2021-10-31 DIAGNOSIS — L0390 Cellulitis, unspecified: Secondary | ICD-10-CM | POA: Diagnosis not present

## 2021-10-31 DIAGNOSIS — Z8619 Personal history of other infectious and parasitic diseases: Secondary | ICD-10-CM | POA: Diagnosis not present

## 2021-10-31 DIAGNOSIS — E785 Hyperlipidemia, unspecified: Secondary | ICD-10-CM

## 2021-10-31 LAB — COMPREHENSIVE METABOLIC PANEL
ALT: 170 U/L — ABNORMAL HIGH (ref 0–53)
AST: 84 U/L — ABNORMAL HIGH (ref 0–37)
Albumin: 4.1 g/dL (ref 3.5–5.2)
Alkaline Phosphatase: 58 U/L (ref 39–117)
BUN: 17 mg/dL (ref 6–23)
CO2: 30 mEq/L (ref 19–32)
Calcium: 8.9 mg/dL (ref 8.4–10.5)
Chloride: 102 mEq/L (ref 96–112)
Creatinine, Ser: 0.86 mg/dL (ref 0.40–1.50)
GFR: 94.93 mL/min (ref >60.00)
Glucose, Bld: 112 mg/dL — ABNORMAL HIGH (ref 70–99)
Potassium: 3.8 mEq/L (ref 3.5–5.1)
Sodium: 140 mEq/L (ref 135–145)
Total Bilirubin: 0.9 mg/dL (ref 0.2–1.2)
Total Protein: 7.2 g/dL (ref 6.0–8.3)

## 2021-10-31 LAB — CBC WITH DIFFERENTIAL/PLATELET
Basophils Absolute: 0 10*3/uL (ref 0.0–0.1)
Basophils Relative: 0.6 % (ref 0.0–3.0)
Eosinophils Absolute: 0.1 10*3/uL (ref 0.0–0.7)
Eosinophils Relative: 2.4 % (ref 0.0–5.0)
HCT: 41.7 % (ref 39.0–52.0)
Hemoglobin: 14 g/dL (ref 13.0–17.0)
Lymphocytes Relative: 31.5 % (ref 12.0–46.0)
Lymphs Abs: 1.9 10*3/uL (ref 0.7–4.0)
MCHC: 33.6 g/dL (ref 30.0–36.0)
MCV: 84.8 fl (ref 78.0–100.0)
Monocytes Absolute: 0.3 10*3/uL (ref 0.1–1.0)
Monocytes Relative: 4.7 % (ref 3.0–12.0)
Neutro Abs: 3.6 10*3/uL (ref 1.4–7.7)
Neutrophils Relative %: 60.8 % (ref 43.0–77.0)
Platelets: 288 10*3/uL (ref 150.0–400.0)
RBC: 4.92 Mil/uL (ref 4.22–5.81)
RDW: 13.1 % (ref 11.5–15.5)
WBC: 5.9 10*3/uL (ref 4.0–10.5)

## 2021-10-31 NOTE — Progress Notes (Signed)
?Phone [phone] ?In person visit ?  ?Subjective:  ? ?MING MCMANNIS is a 61 y.o. year old very pleasant male patient who presents for/with See problem oriented charting ?Chief Complaint  ?Patient presents with  ? Follow-up  ?  Pt is f/u from being in the hospital due to sepsis.   ? ? ?This visit occurred during the SARS-CoV-2 public health emergency.  Safety protocols were in place, including screening questions prior to the visit, additional usage of staff PPE, and extensive cleaning of exam room while observing appropriate contact time as indicated for disinfecting solutions.  ? ?Past Medical History-  ?Patient Active Problem List  ? Diagnosis Date Noted  ? History of stroke 11/07/2019  ?  Priority: High  ? Fatty liver 10/31/2021  ?  Priority: Medium   ? History of adenomatous polyp of colon 03/19/2020  ?  Priority: Medium   ? Hyperlipidemia 07/21/2016  ?  Priority: Medium   ? GERD 09/19/2009  ?  Priority: Medium   ? Recurrent major depression in full remission ([place]) 06/21/2009  ?  Priority: Medium   ? Essential hypertension 02/22/2007  ?  Priority: Medium   ? Asthma 02/22/2007  ?  Priority: Medium   ? Ophthalmic migraine 09/20/2010  ?  Priority: Low  ? DYSPLASTIC NEVUS, BACK 02/05/2008  ?  Priority: Low  ? Lipoma 01/31/2008  ?  Priority: Low  ? ERECTILE DYSFUNCTION, MILD 03/17/2007  ?  Priority: Low  ? Allergic rhinitis 02/22/2007  ?  Priority: Low  ? Acute medial meniscus tear, left, initial encounter 02/07/2021  ? Dysarthria 01/03/2020  ? ? ?Medications- reviewed and updated ?Current Outpatient Medications  ?Medication Sig Dispense Refill  ? albuterol (VENTOLIN HFA) 108 (90 Base) MCG/ACT inhaler Inhale 1-2 puffs into the lungs every 6 (six) hours as needed for wheezing or shortness of breath. 6.7 g 0  ? citalopram (CELEXA) 10 MG tablet Take 1 tablet (10 mg total) by mouth daily. 90 tablet 3  ? cyclobenzaprine (FLEXERIL) 10 MG tablet Take 1 tablet (10 mg total) by mouth 3 (three) times daily as needed for  muscle spasms (do not drive for 8 hours afte use). 30 tablet 0  ? lisinopril (ZESTRIL) 40 MG tablet TAKE 1 TABLET BY MOUTH  DAILY 90 tablet 3  ? loratadine (CLARITIN) 10 MG tablet Take 10 mg by mouth daily.    ? omeprazole (PRILOSEC) 20 MG capsule Take 1 capsule (20 mg total) by mouth daily. 90 capsule 3  ? rosuvastatin (CRESTOR) 40 MG tablet TAKE 1 TABLET(40 MG) BY MOUTH DAILY 90 tablet 3  ? sildenafil (VIAGRA) 100 MG tablet Take 1 tablet (100 mg total) by mouth daily as needed for erectile dysfunction. 10 tablet 11  ? ?No current facility-administered medications for this visit.  ? ?  ?Objective:  ?BP 128/72   Pulse 74   Temp 97.8 ?F (36.6 ?C)   Ht 5' 10"  (1.778 m)   Wt 237 lb 12.8 oz (107.9 kg)   SpO2 97%   BMI 34.12 kg/m?  ?Gen: NAD, resting comfortably ?Mild puffiness below left eye. Forehead not erythematous or warm - compared to prior picture shown. No scalp lesions or warmth or redness. No lymphadenopathy ?CV: RRR no murmurs rubs or gallops ?Lungs: CTAB no crackles, wheeze, rhonchi ?Abdomen: soft/nontender/nondistended/normal bowel sounds. No rebound or guarding.  ?Ext: trace edema ?Skin: warm, dry ?  ? ?Assessment and Plan  ? ?#Hospital follow-up for sepsis resulting from cellulitis of the head and scalp-thought to be  Staph aureus related with origination point likely subcutaneous abscess of the scalp ?S: Patient had been seen here on 10/23/2021 primary complaints of lower bilateral back pain along with some cough and congestion with recent fever.  He was negative for COVID and flu.  No sign of skin irritation at that time-did have some mild pain behind his ears and jaws but ENT exam was largely normal.  We opted to try Flexeril for muscle spasms as well as trial heat and ice for his back pain. ? ?We reached out to the next day and patient reported feeling ill overall with redness onto his forehead-we directed him to go immediately to the hospital.  He went to [facility] in [place] and was  admitted on the 16th.  They noted swelling and redness of the face and scalp with fever up to 102, tachycardia up over 130, leukocytosis up to 19.5 thousand (trended down to 12 at discharge).  He obviously met sepsis criteria based on this.  Chest x-ray and CT angiogram chest (from SOB/congestion but no PE found)without significant acute abnormalities-fatty liver was noted as well as mild cardiomegaly and small hiatal hernia.  Head CT showed anterior and lateral scalp soft tissue fluid measuring 5 mm in thickness with subcutaneous edema.  In the emergency room was started on Zosyn and vancomycin as well as clindamycin.  He was initially placed on 2 L supplemental oxygen and IV fluids.  He was noted to have hypokalemia but this was repleted ? ?He improved steadily while hospitalized and was discharged on Zyvox and cefdinir to complete home 10 day course at home (14 days total).  Hospitalization was approximately 4 days-in the hospital was treated with linezolid/Zyvox and ceftriaxone on day of discharge-vancomycin, cefepime, Zosyn and clindamycin were discontinued. Had been having headaches but those have felt much better after treatment.  ?A/P: sepsis from cellulitis has resolved. Cellulitis much improved- will complete zyvox and cefdinir.   ? ?Also had some sinus inflammation on facial ct- congestion much improved on antibiotics ? ?Back pains are doing much better- gradually improved during hospitalization and back to nomral now ? ?#Fatty liver-we discussed incidental finding and importance of healthy eating/regular exercise/weight loss  ?  ?#history of CVA 10/2019 cared for by [facility](dysarthria)- LDL goal under 70 ?#hyperlipidemia ?S: Medication: Rosuvastatin 40Mg, asa 81m.  Medications continued during hospitalization ?Lab Results  ?Component Value Date  ? CHOL 114 05/31/2021  ? HDL 43 05/31/2021  ? L[place]55 05/31/2021  ? LDLDIRECT 64 05/11/2020  ? TRIG 81 05/31/2021  ? CHOLHDL 2.7 05/31/2021  ?A/P: lipids well  controlled last check- continue current meds. Contiue asa for secondary prevention of stroke ? ?#hypertension ?S: medication: Lisinopril 40Mg.  Lisinopril was held due to soft blood pressure on admission but was able to be restarted before discharge ?BP Readings from Last 3 Encounters:  ?10/31/21 128/72  ?10/23/21 128/78  ?05/31/21 104/70  ? A/P: Controlled on repeat. Continue current medications.  ? ?# Depression ?S: Medication:Celexa 10Mg  ? ?  10/31/2021  ?  8:42 AM 05/31/2021  ?  2:33 PM 05/11/2020  ? 10:03 AM  ?Depression screen PHQ 2/9  ?Decreased Interest 0 0 0  ?Down, Depressed, Hopeless 1 0 0  ?PHQ - 2 Score 1 0 0  ?Altered sleeping 0 1 1  ?Tired, decreased energy 0 1 0  ?Change in appetite 0 0 1  ?Feeling bad or failure about yourself  0 0 0  ?Trouble concentrating 0 0 1  ?Moving slowly  or fidgety/restless 0 0 0  ?Suicidal thoughts 0 0 0  ?PHQ-9 Score 1 2 3   ?Difficult doing work/chores Not difficult at all  Not difficult at all  ?A/P: depression in full remission- continue current medicines ? ?# Asthma ?S: Maintenance Medication: None ?As needed medication: Albuterol. Patient is using this 0x per week.  ?A/P: Controlled. Continue current medications.  ? ?# GERD ?S:Medication: Prilosec 20Mg down from 40 mg. Failed Trial of h2 blocker 05/11/20 ?B12 levels related to PPI use: normal feb 2021 ?A/P: Controlled. Continue current medications.  ?  ?Recommended follow up: Return for next already scheduled visit or sooner if needed. ?Future Appointments  ?Date Time Provider [place]  ?06/03/2022  9:40 AM Yong Channel, Brayton Mars, MD [facility]  ? ?Lab/Order associations: ?  ICD-10-CM   ?1. History of sepsis  Z86.19   ?  ?2. Cellulitis, unspecified cellulitis site  L03.90   ?  ?3. Fatty liver  K76.0   ?  ?4. Hyperlipidemia, unspecified hyperlipidemia type  E78.5   ?  ?5. Essential hypertension  I10   ?  ?6. Recurrent major depression in full remission ([place])  F33.42   ?  ? ? ?No orders of the defined types were  placed in this encounter. ? ? ?Return precautions advised.  ?Garret Reddish, MD ? ?

## 2021-10-31 NOTE — Addendum Note (Signed)
Addended by: Doran Clay A on: 10/31/2021 11:13 AM ? ? Modules accepted: Orders ? ?

## 2021-10-31 NOTE — Patient Instructions (Addendum)
Please stop by lab before you go ?If you have mychart- we will send your results within 3 business days of [place] receiving them.  ?If you do not have mychart- we will call you about results within 5 business days of [place] receiving them.  ?*please also note that you will see labs on mychart as soon as they post. I will later go in and write notes on them- will say "notes from Dr. Yong Channel"  ? ?Obviously let us know if you begin to feel poorly again but you appear to be doing so much better ? ?Recommended follow up: Return for next already scheduled visit or sooner if needed. ?

## 2021-11-01 ENCOUNTER — Other Ambulatory Visit: Payer: Self-pay

## 2021-11-01 DIAGNOSIS — R7401 Elevation of levels of liver transaminase levels: Secondary | ICD-10-CM

## 2021-11-04 ENCOUNTER — Other Ambulatory Visit: Payer: Self-pay

## 2021-11-04 DIAGNOSIS — R7401 Elevation of levels of liver transaminase levels: Secondary | ICD-10-CM

## 2021-11-13 ENCOUNTER — Other Ambulatory Visit: Payer: BC Managed Care – PPO

## 2021-11-19 ENCOUNTER — Other Ambulatory Visit (INDEPENDENT_AMBULATORY_CARE_PROVIDER_SITE_OTHER): Payer: BC Managed Care – PPO

## 2021-11-19 DIAGNOSIS — R7401 Elevation of levels of liver transaminase levels: Secondary | ICD-10-CM | POA: Diagnosis not present

## 2021-11-19 DIAGNOSIS — R972 Elevated prostate specific antigen [PSA]: Secondary | ICD-10-CM | POA: Diagnosis not present

## 2021-11-19 LAB — BASIC METABOLIC PANEL
BUN: 18 mg/dL (ref 6–23)
CO2: 31 mEq/L (ref 19–32)
Calcium: 9.2 mg/dL (ref 8.4–10.5)
Chloride: 103 mEq/L (ref 96–112)
Creatinine, Ser: 0.81 mg/dL (ref 0.40–1.50)
GFR: 96.63 mL/min (ref >60.00)
Glucose, Bld: 77 mg/dL (ref 70–99)
Potassium: 3.9 mEq/L (ref 3.5–5.1)
Sodium: 140 mEq/L (ref 135–145)

## 2021-11-19 LAB — PSA: PSA: 0.28 ng/mL (ref 0.10–4.00)

## 2021-11-27 ENCOUNTER — Other Ambulatory Visit: Payer: Self-pay

## 2021-11-27 DIAGNOSIS — R7401 Elevation of levels of liver transaminase levels: Secondary | ICD-10-CM

## 2021-12-04 ENCOUNTER — Other Ambulatory Visit (INDEPENDENT_AMBULATORY_CARE_PROVIDER_SITE_OTHER): Payer: BC Managed Care – PPO

## 2021-12-04 DIAGNOSIS — R7401 Elevation of levels of liver transaminase levels: Secondary | ICD-10-CM

## 2021-12-04 LAB — COMPREHENSIVE METABOLIC PANEL
ALT: 22 U/L (ref 0–53)
AST: 23 U/L (ref 0–37)
Albumin: 4.7 g/dL (ref 3.5–5.2)
Alkaline Phosphatase: 61 U/L (ref 39–117)
BUN: 23 mg/dL (ref 6–23)
CO2: 30 mEq/L (ref 19–32)
Calcium: 9.8 mg/dL (ref 8.4–10.5)
Chloride: 100 mEq/L (ref 96–112)
Creatinine, Ser: 0.78 mg/dL (ref 0.40–1.50)
GFR: 97.71 mL/min (ref >60.00)
Glucose, Bld: 88 mg/dL (ref 70–99)
Potassium: 4.6 mEq/L (ref 3.5–5.1)
Sodium: 139 mEq/L (ref 135–145)
Total Bilirubin: 1.7 mg/dL — ABNORMAL HIGH (ref 0.2–1.2)
Total Protein: 7.8 g/dL (ref 6.0–8.3)

## 2021-12-16 ENCOUNTER — Telehealth: Payer: Self-pay | Admitting: Family Medicine

## 2021-12-16 DIAGNOSIS — N492 Inflammatory disorders of scrotum: Secondary | ICD-10-CM | POA: Diagnosis not present

## 2021-12-16 DIAGNOSIS — N5082 Scrotal pain: Secondary | ICD-10-CM | POA: Diagnosis not present

## 2021-12-16 DIAGNOSIS — N433 Hydrocele, unspecified: Secondary | ICD-10-CM | POA: Diagnosis not present

## 2021-12-16 DIAGNOSIS — L539 Erythematous condition, unspecified: Secondary | ICD-10-CM | POA: Diagnosis not present

## 2021-12-16 DIAGNOSIS — N5089 Other specified disorders of the male genital organs: Secondary | ICD-10-CM | POA: Diagnosis not present

## 2021-12-16 NOTE — Telephone Encounter (Signed)
Called pt and he states he is currently at [facility] ED. ?

## 2021-12-16 NOTE — Telephone Encounter (Signed)
Ok to work pt in for cyst on testicle? ?

## 2021-12-16 NOTE — Telephone Encounter (Signed)
Patient ?Name: ?Eduardo WALK ?Turner ?Gender: Male ?DOB: [birth year] ?Age: 62 Y 4 M 27 D ?Return ?Phone ?Number: ?[phone] ?(Primary) ?Address: ?City/ ?State/ ?Zip: ?[place] ?[address] ?Client [place] [facility] [place] [facility] ?[place] ?Presenter, broadcasting at [place] [facility] ?Provider Garret Reddish- MD ?Contact Type Call ?Who Is Calling Patient / Member / Family / Caregiver ?Call Type Triage / Clinical ?Relationship To Patient Self ?Return Phone Number [phone] (Primary) ?Chief Complaint TESTICULAR - sudden pain ?Reason for Call Symptomatic / Request for Health Information ?Initial Comment Caller states that he wants to be seen today for a ?cyst that is around his testicle area. It is hurting. ?[place] Not Heidi Dach Turner ?Translation No ?Nurse Assessment ?Nurse: Waymond Cera, RN, Benjamine Mola Date/Time (Eastern Time): 12/16/2021 8:00:12 AM ?Confirm and document reason for call. If ?symptomatic, describe symptoms. ?---Caller states that he has a cyst in his testicle area the ?size of a pea that is red and hurting. ?Does the patient have any new or worsening ?symptoms? ---Yes ?Will a triage be completed? ---Yes ?Related visit to physician within the last 2 weeks? ---No ?Does the PT have any chronic conditions? (i.e. ?diabetes, asthma, this includes High risk factors for ?pregnancy, etc.) ?---Yes ?List chronic conditions. ---htn high cholesterol ?Is this a behavioral health or substance abuse call? ---No ?Guidelines ?Guideline Title Affirmed Question Affirmed Notes Nurse Date/Time (Eastern ?Time) ?Scrotum Pain [1] Constant pain in ?scrotum or testicle ?AND [2] present > 1 ?hour ?Cantrell, RN, ?Elizabeth ?12/16/2021 8:01:05 AM ?Disp. Time (Eastern ?Time) Disposition Final User ?12/16/2021 7:57:34 AM Send to Urgent Queue Ivar Bury, Santiago Glad ?PLEASE NOTE: All timestamps contained within this report are represented as [place] Standard Time. ?CONFIDENTIALTY NOTICE: This fax transmission is  intended only for the addressee. It contains information that is legally privileged, confidential or ?otherwise protected from use or disclosure. If you are not the intended recipient, you are strictly prohibited from reviewing, disclosing, copying using ?or disseminating any of this information or taking any action in reliance on or regarding this information. If you have received this fax in error, please ?notify us immediately by telephone so that we can arrange for its return to [place]. Phone: [phone], Toll-Free: [phone], Fax: [phone] ?Page: 2 of 2 ?Call Id: [id number] ?12/16/2021 8:03:25 AM Go to ED Now Yes Cantrell, RN, Benjamine Mola ?Caller Disagree/Comply Comply ?Caller Understands Yes ?PreDisposition InappropriateToAsk ?Care Advice Given Per Guideline ?GO TO ED NOW: * You need to be seen in the Emergency Department. CARE ADVICE given per Scrotum Pain (Adult) guideline. ?NOTHING BY MOUTH: * Do not eat or drink anything for now. ?Comments ?User: Sharol Given, RN Date/Time Eilene Ghazi Time): 12/16/2021 8:02:47 AM ?States pain at lump only. ?Referrals ?GO TO FACILITY OTHER - SPECIFY ?

## 2021-12-16 NOTE — Telephone Encounter (Signed)
See below

## 2021-12-16 NOTE — Telephone Encounter (Signed)
Need to work him in as soon as possible-I am okay working him in today-may need to get testicular ultrasound-can put at 1 PM but really have him drive over as soon as he can so we can have time to get stat ultrasound ordered if needed ?

## 2021-12-16 NOTE — Telephone Encounter (Signed)
FYI

## 2022-01-28 ENCOUNTER — Encounter: Payer: Self-pay | Admitting: Family Medicine

## 2022-01-29 NOTE — Telephone Encounter (Signed)
See below

## 2022-01-30 ENCOUNTER — Other Ambulatory Visit: Payer: Self-pay

## 2022-01-30 MED ORDER — CITALOPRAM HYDROBROMIDE 20 MG PO TABS: 20.0000 mg | ORAL_TABLET | Freq: Every day | ORAL | 5 refills | Status: DC

## 2022-03-10 ENCOUNTER — Encounter: Payer: Self-pay | Admitting: Physician Assistant

## 2022-03-10 ENCOUNTER — Ambulatory Visit (INDEPENDENT_AMBULATORY_CARE_PROVIDER_SITE_OTHER): Payer: BC Managed Care – PPO | Admitting: Physician Assistant

## 2022-03-10 VITALS — BP 110/70 | HR 68 | Temp 98.2°F | Ht 70.0 in | Wt 240.0 lb

## 2022-03-10 DIAGNOSIS — H00012 Hordeolum externum right lower eyelid: Secondary | ICD-10-CM | POA: Diagnosis not present

## 2022-03-10 MED ORDER — BACITRACIN-POLYMYXIN B 500-10000 UNIT/GM OP OINT: 1.0000 | TOPICAL_OINTMENT | Freq: Two times a day (BID) | OPHTHALMIC | 0 refills | Status: DC

## 2022-03-10 NOTE — Progress Notes (Signed)
Eduardo Turner is a 61 y.o. male here for a new problem of eye swelling.   History of Present Illness:   Chief Complaint  Patient presents with   eye swellling    Pt c/o right eye started swelling on Saturday, eye is red, itchy and swollen under neath.    HPI  Eye Swelling  Patient presents with c/o right eye swelling that has been onset for the past 3-4 days. States his eye  is red, swollen and itchy. States he thought this was due to allergies and he has taken Benadryl. Has had some pain in his eyelid. He initially tried a warm compress with no relief. He also tried using Visine which did provide him no relief. Never had anything like this before. No other specific treatment tried. No fever or chills. No ear pain or pressure. Denies vision changes, contact use, or changes in hygiene products.   Past Medical History:  Diagnosis Date   Allergy    Asthma    Dysplastic nevi    GERD (gastroesophageal reflux disease)    Hypertension    Multiple lipomas    Peptic esophageal ulcer    Stroke ([place]) 10/30/2019     Social History   Tobacco Use   Smoking status: Never   Smokeless tobacco: Never  Vaping Use   Vaping Use: Never used  Substance Use Topics   Alcohol use: Yes    Alcohol/week: 0.0 standard drinks of alcohol    Comment: occ   Drug use: No    Past Surgical History:  Procedure Laterality Date   COLONOSCOPY     KNEE SURGERY Left    SHOULDER SURGERY     bilateral    Family History  Problem Relation Age of Onset   Other Mother        routine surgery lead to sepsis- hysterectomy   Heart attack Father        age 10   Bipolar disorder Sister    Colon cancer Neg Hx    Esophageal cancer Neg Hx    Rectal cancer Neg Hx    Stomach cancer Neg Hx     Allergies  Allergen Reactions   Erythromycin Ethylsuccinate     REACTION: HIVES    Current Medications:   Current Outpatient Medications:    albuterol (VENTOLIN HFA) 108 (90 Base) MCG/ACT inhaler, Inhale 1-2 puffs  into the lungs every 6 (six) hours as needed for wheezing or shortness of breath., Disp: 6.7 g, Rfl: 0   bacitracin-polymyxin b (POLYSPORIN) ophthalmic ointment, Place 1 Application into the right eye every 12 (twelve) hours. apply to eye every 12 hours while awake, Disp: 3.5 g, Rfl: 0   citalopram (CELEXA) 20 MG tablet, Take 1 tablet (20 mg total) by mouth daily., Disp: 30 tablet, Rfl: 5   cyclobenzaprine (FLEXERIL) 10 MG tablet, Take 1 tablet (10 mg total) by mouth 3 (three) times daily as needed for muscle spasms (do not drive for 8 hours afte use)., Disp: 30 tablet, Rfl: 0   lisinopril (ZESTRIL) 40 MG tablet, TAKE 1 TABLET BY MOUTH  DAILY, Disp: 90 tablet, Rfl: 3   loratadine (CLARITIN) 10 MG tablet, Take 10 mg by mouth daily., Disp: , Rfl:    omeprazole (PRILOSEC) 20 MG capsule, Take 1 capsule (20 mg total) by mouth daily., Disp: 90 capsule, Rfl: 3   rosuvastatin (CRESTOR) 40 MG tablet, TAKE 1 TABLET(40 MG) BY MOUTH DAILY, Disp: 90 tablet, Rfl: 3   sildenafil (VIAGRA) 100 MG  tablet, Take 1 tablet (100 mg total) by mouth daily as needed for erectile dysfunction., Disp: 10 tablet, Rfl: 11   Review of Systems:   ROS Negative unless otherwise specified per HPI.   Vitals:   Vitals:   03/10/22 1539  BP: 110/70  Pulse: 68  Temp: 98.2 F (36.8 C)  TempSrc: Temporal  SpO2: 95%  Weight: 240 lb (108.9 kg)  Height: '5\' 10"'$  (1.778 m)     Body mass index is 34.44 kg/m.  Physical Exam:   Physical Exam Vitals and nursing note reviewed.  Constitutional:      General: He is not in acute distress.    Appearance: He is well-developed. He is not ill-appearing or toxic-appearing.  Eyes:     General: Lids are normal.        Right eye: Hordeolum present.     Extraocular Movements: Extraocular movements intact.     Conjunctiva/sclera: Conjunctivae normal.  Cardiovascular:     Rate and Rhythm: Normal rate and regular rhythm.     Pulses: Normal pulses.     Heart sounds: Normal heart sounds, S1  normal and S2 normal.  Pulmonary:     Effort: Pulmonary effort is normal.     Breath sounds: Normal breath sounds.  Skin:    General: Skin is warm and dry.  Neurological:     Mental Status: He is alert.     GCS: GCS eye subscore is 4. GCS verbal subscore is 5. GCS motor subscore is 6.  Psychiatric:        Speech: Speech normal.        Behavior: Behavior normal. Behavior is cooperative.     Assessment and Plan:   Hordeolum externum right lower eyelid No red flags Recommend warm compresses and anti-inflammatories for pain/inflammation Will empirically provide polysporin eye ointment for potential infection Follow-up if new/worsening sx  I,Savera Zaman,acting as a scribe for [employer], PA.,have documented all relevant documentation on the behalf of Inda Coke, PA,as directed by  Inda Coke, PA while in the presence of Inda Coke, [place].   I, Inda Coke, [place], have reviewed all documentation for this visit. The documentation on 03/10/22 for the exam, diagnosis, procedures, and orders are all accurate and complete.   Inda Coke, PA-C

## 2022-03-10 NOTE — Patient Instructions (Addendum)
It was great to see you!  I think that you have a stye  Trial the eye ointment that I have sent in  Continue compresses (see below)  May take ibuprofen for the pain/inflammation  Let us know if any worsening or no better!   Stye A stye, also known as a hordeolum, is a bump that forms on an eyelid. It may look like a pimple next to the eyelash. A stye can form inside the eyelid (internal stye) or outside the eyelid (external stye). A stye can cause redness, swelling, and pain on the eyelid. Styes are very common. Anyone can get them at any age. They usually occur in just one eye at a time, but you may have more than one in either eye. What are the causes? A stye is caused by an infection. The infection is almost always caused by bacteria called Staphylococcus aureus. This is a common type of bacteria that lives on the skin. An internal stye may result from an infected oil-producing gland inside the eyelid. An external stye may be caused by an infection at the base of the eyelash (hair follicle). What increases the risk? You are more likely to develop a stye if: You have had a stye before. You have any of these conditions: Red, itchy, inflamed eyelids (blepharitis). A skin condition such as seborrheic dermatitis or rosacea. High fat levels in your blood (lipids). Dry eyes. What are the signs or symptoms? The most common symptom of a stye is eyelid pain. Internal styes are more painful than external styes. Other symptoms may include: Painful swelling of your eyelid. A scratchy feeling in your eye. Tearing and redness of your eye. A pimple-like bump on the edge of the eyelid. Pus draining from the stye. How is this diagnosed? Your health care provider may be able to diagnose a stye just by examining your eye. The health care provider may also check to make sure: You do not have a fever or other signs of a more serious infection. The infection has not spread to other parts of your eye  or areas around your eye. How is this treated? Most styes will clear up in a few days without treatment or with warm compresses applied to the area. You may need to use antibiotic drops or ointment to treat an infection. Sometimes, steroid drops or ointment are used in addition to antibiotics. In some cases, your health care provider may give you a small steroid injection in the eyelid. If your stye does not heal with routine treatment, your health care provider may drain pus from the stye using a thin blade or needle. This may be done if the stye is large, causing a lot of pain, or affecting your vision. Follow these instructions at home: Take over-the-counter and prescription medicines only as told by your health care provider. This includes eye drops or ointments. If you were prescribed an antibiotic medicine, steroid medicine, or both, apply or use them as told by your health care provider. Do not stop using the medicine even if your condition improves. Apply a warm, wet cloth (warm compress) to your eye for 5-10 minutes, 4 to 6 times a day. Clean the affected eyelid as directed by your health care provider. Do not wear contact lenses or eye makeup until your stye has healed and your health care provider says that it is safe. Do not try to pop or drain the stye. Do not rub your eye. Contact a health care provider if:  You have chills or a fever. Your stye does not go away after several days. Your stye affects your vision. Your eyeball becomes swollen, red, or painful. Get help right away if: You have pain when moving your eye around. Summary A stye is a bump that forms on an eyelid. It may look like a pimple next to the eyelash. A stye can form inside the eyelid (internal stye) or outside the eyelid (external stye). A stye can cause redness, swelling, and pain on the eyelid. Your health care provider may be able to diagnose a stye just by examining your eye. Apply a warm, wet cloth (warm  compress) to your eye for 5-10 minutes, 4 to 6 times a day.

## 2022-03-15 ENCOUNTER — Other Ambulatory Visit: Payer: Self-pay | Admitting: Family Medicine

## 2022-03-29 ENCOUNTER — Other Ambulatory Visit: Payer: Self-pay | Admitting: Family Medicine

## 2022-05-05 ENCOUNTER — Encounter: Payer: Self-pay | Admitting: *Deleted

## 2022-06-03 ENCOUNTER — Ambulatory Visit (INDEPENDENT_AMBULATORY_CARE_PROVIDER_SITE_OTHER): Payer: BC Managed Care – PPO | Admitting: Family Medicine

## 2022-06-03 ENCOUNTER — Encounter: Payer: Self-pay | Admitting: Family Medicine

## 2022-06-03 VITALS — BP 114/78 | HR 62 | Temp 98.3°F | Resp 16 | Ht 70.0 in | Wt 231.8 lb

## 2022-06-03 DIAGNOSIS — E785 Hyperlipidemia, unspecified: Secondary | ICD-10-CM | POA: Diagnosis not present

## 2022-06-03 DIAGNOSIS — Z79899 Other long term (current) drug therapy: Secondary | ICD-10-CM

## 2022-06-03 DIAGNOSIS — Z23 Encounter for immunization: Secondary | ICD-10-CM | POA: Diagnosis not present

## 2022-06-03 DIAGNOSIS — I10 Essential (primary) hypertension: Secondary | ICD-10-CM

## 2022-06-03 DIAGNOSIS — Z0000 Encounter for general adult medical examination without abnormal findings: Secondary | ICD-10-CM

## 2022-06-03 DIAGNOSIS — Z125 Encounter for screening for malignant neoplasm of prostate: Secondary | ICD-10-CM | POA: Diagnosis not present

## 2022-06-03 LAB — COMPREHENSIVE METABOLIC PANEL
ALT: 22 U/L (ref 0–53)
AST: 21 U/L (ref 0–37)
Albumin: 4.7 g/dL (ref 3.5–5.2)
Alkaline Phosphatase: 51 U/L (ref 39–117)
BUN: 21 mg/dL (ref 6–23)
CO2: 29 mEq/L (ref 19–32)
Calcium: 9.6 mg/dL (ref 8.4–10.5)
Chloride: 101 mEq/L (ref 96–112)
Creatinine, Ser: 0.76 mg/dL (ref 0.40–1.50)
GFR: 98.13 mL/min (ref >60.00)
Glucose, Bld: 90 mg/dL (ref 70–99)
Potassium: 4.3 mEq/L (ref 3.5–5.1)
Sodium: 137 mEq/L (ref 135–145)
Total Bilirubin: 2.2 mg/dL — ABNORMAL HIGH (ref 0.2–1.2)
Total Protein: 7.4 g/dL (ref 6.0–8.3)

## 2022-06-03 LAB — LIPID PANEL
Cholesterol: 129 mg/dL (ref 0–200)
HDL: 40.7 mg/dL (ref >39.00)
LDL Cholesterol: 63 mg/dL (ref 0–99)
NonHDL: 87.97
Total CHOL/HDL Ratio: 3
Triglycerides: 123 mg/dL (ref 0.0–149.0)
VLDL: 24.6 mg/dL (ref 0.0–40.0)

## 2022-06-03 LAB — CBC WITH DIFFERENTIAL/PLATELET
Basophils Absolute: 0 10*3/uL (ref 0.0–0.1)
Basophils Relative: 0.5 % (ref 0.0–3.0)
Eosinophils Absolute: 0.1 10*3/uL (ref 0.0–0.7)
Eosinophils Relative: 2.4 % (ref 0.0–5.0)
HCT: 45.5 % (ref 39.0–52.0)
Hemoglobin: 15.3 g/dL (ref 13.0–17.0)
Lymphocytes Relative: 26.9 % (ref 12.0–46.0)
Lymphs Abs: 1.6 10*3/uL (ref 0.7–4.0)
MCHC: 33.5 g/dL (ref 30.0–36.0)
MCV: 87 fl (ref 78.0–100.0)
Monocytes Absolute: 0.4 10*3/uL (ref 0.1–1.0)
Monocytes Relative: 6.3 % (ref 3.0–12.0)
Neutro Abs: 3.8 10*3/uL (ref 1.4–7.7)
Neutrophils Relative %: 63.9 % (ref 43.0–77.0)
Platelets: 209 10*3/uL (ref 150.0–400.0)
RBC: 5.23 Mil/uL (ref 4.22–5.81)
RDW: 13.7 % (ref 11.5–15.5)
WBC: 6 10*3/uL (ref 4.0–10.5)

## 2022-06-03 LAB — VITAMIN B12: Vitamin B-12: 375 pg/mL (ref 211–911)

## 2022-06-03 LAB — PSA: PSA: 0.32 ng/mL (ref 0.10–4.00)

## 2022-06-03 NOTE — Progress Notes (Signed)
Phone: [phone]   Subjective:  Patient presents today for their annual physical. Chief complaint-noted.   See problem oriented charting- ROS- full  review of systems was completed and negative  except for: seasonal allergies- gets some eye itching nad clear discharge  The following were reviewed and entered/updated in epic: Past Medical History:  Diagnosis Date   Allergy    Asthma    Dysplastic nevi    GERD (gastroesophageal reflux disease)    Hypertension    Multiple lipomas    Peptic esophageal ulcer    Stroke ([place]) 10/30/2019   Patient Active Problem List   Diagnosis Date Noted   History of stroke 11/07/2019    Priority: High   Fatty liver 10/31/2021    Priority: Medium    History of adenomatous polyp of colon 03/19/2020    Priority: Medium    Hyperlipidemia 07/21/2016    Priority: Medium    GERD 09/19/2009    Priority: Medium    Recurrent major depression in full remission ([place]) 06/21/2009    Priority: Medium    Essential hypertension 02/22/2007    Priority: Medium    Asthma 02/22/2007    Priority: Medium    Ophthalmic migraine 09/20/2010    Priority: Low   DYSPLASTIC NEVUS, BACK 02/05/2008    Priority: Low   Lipoma 01/31/2008    Priority: Low   ERECTILE DYSFUNCTION, MILD 03/17/2007    Priority: Low   Allergic rhinitis 02/22/2007    Priority: Low   Acute medial meniscus tear, left, initial encounter 02/07/2021   Dysarthria 01/03/2020   Past Surgical History:  Procedure Laterality Date   COLONOSCOPY     KNEE SURGERY Left    SHOULDER SURGERY     bilateral    Family History  Problem Relation Age of Onset   Other Mother        routine surgery lead to sepsis- hysterectomy   Heart attack Father        age 73   Bipolar disorder Sister    Colon cancer Neg Hx    Esophageal cancer Neg Hx    Rectal cancer Neg Hx    Stomach cancer Neg Hx     Medications- reviewed and updated Current Outpatient Medications  Medication Sig Dispense Refill    albuterol (VENTOLIN HFA) 108 (90 Base) MCG/ACT inhaler Inhale 1-2 puffs into the lungs every 6 (six) hours as needed for wheezing or shortness of breath. 6.7 g 0   aspirin EC 81 MG tablet Take 81 mg by mouth daily. Swallow whole.     bacitracin-polymyxin b (POLYSPORIN) ophthalmic ointment Place 1 Application into the right eye every 12 (twelve) hours. apply to eye every 12 hours while awake 3.5 g 0   citalopram (CELEXA) 20 MG tablet Take 1 tablet (20 mg total) by mouth daily. 30 tablet 5   cyclobenzaprine (FLEXERIL) 10 MG tablet Take 1 tablet (10 mg total) by mouth 3 (three) times daily as needed for muscle spasms (do not drive for 8 hours afte use). 30 tablet 0   lisinopril (ZESTRIL) 40 MG tablet TAKE 1 TABLET BY MOUTH  DAILY 90 tablet 3   loratadine (CLARITIN) 10 MG tablet Take 10 mg by mouth daily.     omeprazole (PRILOSEC) 20 MG capsule Take 1 capsule (20 mg total) by mouth daily. 90 capsule 3   rosuvastatin (CRESTOR) 40 MG tablet TAKE 1 TABLET(40 MG) BY MOUTH DAILY 90 tablet 3   sildenafil (VIAGRA) 100 MG tablet Take 1 tablet (100 mg  total) by mouth daily as needed for erectile dysfunction. 10 tablet 11   No current facility-administered medications for this visit.    Allergies-reviewed and updated Allergies  Allergen Reactions   Erythromycin Ethylsuccinate     REACTION: HIVES    Social History   Social History Narrative   Married. 2 children both out of house. 4 grandkids total (1 in [place] with son, Orene Desanctis- son of Maddy). Granddaughter 34 and grandson 5 in 05/2022- keeping them full time      Radio broadcast assistant at [employer].    Objective  Objective:  BP 114/78   Pulse 62   Temp 98.3 F (36.8 C) (Temporal)   Resp 16   Ht '5\' 10"'$  (1.778 m)   Wt 231 lb 12.8 oz (105.1 kg)   SpO2 95%   BMI 33.26 kg/m  Gen: NAD, resting comfortably HEENT: Mucous membranes are moist. Oropharynx normal Neck: no thyromegaly CV: RRR no murmurs rubs or gallops Lungs: CTAB no crackles, wheeze,  rhonchi Abdomen: soft/nontender/nondistended/normal bowel sounds. No rebound or guarding.  Ext: no edema Skin: warm, dry Neuro: grossly normal, moves all extremities, PERRLA    Assessment and Plan  61 y.o. male presenting for annual physical.  Health Maintenance counseling: 1. Anticipatory guidance: Patient counseled regarding regular dental exams -q6 months, eye exams -yearly,  avoiding smoking and second hand smoke , limiting alcohol to 2 beverages per day , no illicit drugs.   2. Risk factor reduction:  Advised patient of need for regular exercise and diet rich and fruits and vegetables to reduce risk of heart attack and stroke.  Exercise-active at work and with grandkids and bowling but recommended 150 minutes a week exercise for cardiovascular health- has started some walking fortunately.  Diet/weight management-Down 2 pounds from last year's physical- had gotten up to 245 and has started to work this back down- encouraged by care nurse. Working with an Radiation protection practitioner- tracks calories and weighs daily Wt Readings from Last 3 Encounters:  06/03/22 231 lb 12.8 oz (105.1 kg)  03/10/22 240 lb (108.9 kg)  10/31/21 237 lb 12.8 oz (107.9 kg)  3. Immunizations/screenings/ancillary studies-flu shot today, COVID-19 vaccination-declines, Shingrix- declines for now  Immunization History  Administered Date(s) Administered   Influenza Whole 05/08/2008, 06/21/2009   Influenza,inj,Quad PF,6+ Mos 05/25/2018, 06/01/2019, 05/11/2020, 05/31/2021   Influenza-Unspecified 06/11/2016   PFIZER(Purple Top)SARS-COV-2 Vaccination 12/02/2019, 12/26/2019   Td 08/11/2002   Tdap 10/16/2014   4. Prostate cancer screening- defers rectal unless PSA trend concerning-update with labs today to line up with physical again. Did have 1x bump but came back down  Lab Results  Component Value Date   PSA 0.28 11/19/2021   PSA 2.39 05/31/2021   PSA 0.67 10/11/2019   5. Colon cancer screening - colonoscopy 03/09/20 with  3-year repeat planned due to polyps  6. Skin cancer screening-no dermatologist.  advised regular sunscreen use. Denies worrisome, changing, or new skin lesions.   7. Smoking associated screening (lung cancer screening, AAA screen 65-75, UA)- Never smoker  8. STD screening - monogamous    Status of chronic or acute concerns   #history of CVA 10/2019 cared for by [facility](dysarthria)- LDL goal under 70 #hyperlipidemia S: Medication: Rosuvastatin '40Mg'$ , asa '81mg'$  Lab Results  Component Value Date   CHOL 114 05/31/2021   HDL 43 05/31/2021   LDLCALC 55 05/31/2021   LDLDIRECT 64 05/11/2020   TRIG 81 05/31/2021   CHOLHDL 2.7 05/31/2021  A/P: lipids hopefully stable- update with labs. For [employer]  history- continue aspirin and statin at current dose  # Depression S: Medication:Celexa '20Mg'$      06/03/2022   10:18 AM 06/03/2022    9:44 AM 10/31/2021    8:42 AM  Depression screen PHQ 2/9  Decreased Interest 0 0 0  Down, Depressed, Hopeless 0 0 1  PHQ - 2 Score 0 0 1  Altered sleeping 0  0  Tired, decreased energy 0  0  Change in appetite 1  0  Feeling bad or failure about yourself  0  0  Trouble concentrating 0  0  Moving slowly or fidgety/restless 0  0  Suicidal thoughts 0  0  PHQ-9 Score 1  1  Difficult doing work/chores Not difficult at all  Not difficult at all  A/P: full remission- continue current meds  # Asthma S: Maintenance Medication: None As needed medication: Albuterol. Patient is using this 0-1x per wee depending on allergies A/P: Controlled. Continue current medications.    # GERD with small hiatal hernia discovered 10/24/[facility]:Medication: Prilosec '20Mg'$  down from 40 mg. -failed Trial of h2 bloc:ker 05/11/20 B12 levels related to PPI use:  Lab Results  Component Value Date   VITAMINB12 380 05/31/2021    A/P: GERD- Controlled. Continue current medications.  Prior pepcid failure- hold off on repeat -check b12   #hypertension S: medication: Lisinopril '40Mg'$  Home  readings #s: no recent checks   BP Readings from Last 3 Encounters:  06/03/22 114/78  03/10/22 110/70  10/31/21 128/72   A/P: Controlled. Continue current medications.    #Fatty liver-incidentally discovered during cellulitis/sepsis work-up-update LFTs with labs  #History of sepsis due to cellulitis of the scalp in March 2023-hospitalized at [facility] no recurrence of cellulitis  Recommended follow up: Return in about 6 months (around 12/03/2022) for followup or sooner if needed.Schedule b4 you leave.  Lab/Order associations: fasting   ICD-10-CM   1. Preventative health care  Z00.00     2. Hyperlipidemia, unspecified hyperlipidemia type  E78.5     3. Essential hypertension  I10     4. Screening for prostate cancer  Z12.5     5. High risk medication use  Z79.899       No orders of the defined types were placed in this encounter.   Return precautions advised.  Garret Reddish, MD

## 2022-06-03 NOTE — Patient Instructions (Addendum)
Please stop by lab before you go If you have mychart- we will send your results within 3 business days of [place] receiving them.  If you do not have mychart- we will call you about results within 5 business days of [place] receiving them.  *please also note that you will see labs on mychart as soon as they post. I will later go in and write notes on them- will say "notes from Dr. Yong Channel"   Flu shot today  Recommended follow up: Return in about 6 months (around 12/03/2022) for followup or sooner if needed.Schedule b4 you leave.

## 2022-07-25 ENCOUNTER — Other Ambulatory Visit: Payer: Self-pay | Admitting: Family Medicine

## 2022-07-25 MED ORDER — CYCLOBENZAPRINE HCL 10 MG PO TABS: 10.0000 mg | ORAL_TABLET | Freq: Three times a day (TID) | ORAL | 0 refills | Status: DC | PRN

## 2022-10-10 ENCOUNTER — Other Ambulatory Visit: Payer: Self-pay | Admitting: Family Medicine

## 2022-12-04 ENCOUNTER — Ambulatory Visit (INDEPENDENT_AMBULATORY_CARE_PROVIDER_SITE_OTHER): Payer: BC Managed Care – PPO | Admitting: Family Medicine

## 2022-12-04 ENCOUNTER — Encounter: Payer: Self-pay | Admitting: Family Medicine

## 2022-12-04 VITALS — BP 124/80 | HR 62 | Temp 98.0°F | Wt 238.2 lb

## 2022-12-04 DIAGNOSIS — Z23 Encounter for immunization: Secondary | ICD-10-CM

## 2022-12-04 DIAGNOSIS — M67449 Ganglion, unspecified hand: Secondary | ICD-10-CM

## 2022-12-04 DIAGNOSIS — E785 Hyperlipidemia, unspecified: Secondary | ICD-10-CM

## 2022-12-04 DIAGNOSIS — I10 Essential (primary) hypertension: Secondary | ICD-10-CM

## 2022-12-04 DIAGNOSIS — F3342 Major depressive disorder, recurrent, in full remission: Secondary | ICD-10-CM

## 2022-12-04 NOTE — Progress Notes (Signed)
Phone [phone] In person visit   Subjective:   Eduardo Turner is a 62 y.o. year old very pleasant male patient who presents for/with See problem oriented charting Chief Complaint  Patient presents with   Medical Management of Chronic Issues   Hypertension   Hyperlipidemia    Past Medical History-  Patient Active Problem List   Diagnosis Date Noted   History of stroke 11/07/2019    Priority: High   Fatty liver 10/31/2021    Priority: Medium    History of adenomatous polyp of colon 03/19/2020    Priority: Medium    Hyperlipidemia 07/21/2016    Priority: Medium    GERD 09/19/2009    Priority: Medium    Recurrent major depression in full remission 06/21/2009    Priority: Medium    Essential hypertension 02/22/2007    Priority: Medium    Asthma 02/22/2007    Priority: Medium    Ophthalmic migraine 09/20/2010    Priority: Low   DYSPLASTIC NEVUS, BACK 02/05/2008    Priority: Low   Lipoma 01/31/2008    Priority: Low   ERECTILE DYSFUNCTION, MILD 03/17/2007    Priority: Low   Allergic rhinitis 02/22/2007    Priority: Low   Acute medial meniscus tear, left, initial encounter 02/07/2021   Dysarthria 01/03/2020    Medications- reviewed and updated Current Outpatient Medications  Medication Sig Dispense Refill   albuterol (VENTOLIN HFA) 108 (90 Base) MCG/ACT inhaler Inhale 1-2 puffs into the lungs every 6 (six) hours as needed for wheezing or shortness of breath. 6.7 g 0   aspirin EC 81 MG tablet Take 81 mg by mouth daily. Swallow whole.     bacitracin-polymyxin b (POLYSPORIN) ophthalmic ointment Place 1 Application into the right eye every 12 (twelve) hours. apply to eye every 12 hours while awake 3.5 g 0   citalopram (CELEXA) 20 MG tablet TAKE 1 TABLET(20 MG) BY MOUTH DAILY 90 tablet 3   lisinopril (ZESTRIL) 40 MG tablet TAKE 1 TABLET BY MOUTH  DAILY 90 tablet 3   loratadine (CLARITIN) 10 MG tablet Take 10 mg by mouth daily.     omeprazole (PRILOSEC) 20 MG capsule Take  1 capsule (20 mg total) by mouth daily. 90 capsule 3   rosuvastatin (CRESTOR) 40 MG tablet TAKE 1 TABLET(40 MG) BY MOUTH DAILY 90 tablet 3   sildenafil (VIAGRA) 100 MG tablet Take 1 tablet (100 mg total) by mouth daily as needed for erectile dysfunction. 10 tablet 11   cyclobenzaprine (FLEXERIL) 10 MG tablet Take 1 tablet (10 mg total) by mouth 3 (three) times daily as needed for muscle spasms (do not drive for 8 hours afte use). (Patient not taking: Reported on 12/04/2022) 30 tablet 0   No current facility-administered medications for this visit.     Objective:  BP 124/80   Pulse 62   Temp 98 F (36.7 C)   Wt 238 lb 3.2 oz (108 kg)   SpO2 97%   BMI 34.18 kg/m  Gen: NAD, resting comfortably CV: RRR no murmurs rubs or gallops Lungs: CTAB no crackles, wheeze, rhonchi Ext: trace edema Skin: warm, dry At distal interphalangeal joint on 2nd finger right hand- about 1 cm x 1 cm     Assessment and Plan   # growth on finger S:noted a growth just distal to distal interphalangeal joint on right 2nd finger.  There for a month but painful A/P: refer today to sports medicine    #history of CVA 10/2019 cared for by  [facility](dysarthria)- LDL goal under 70 #hyperlipidemia S: Medication: Rosuvastatin , asa  -no substantial lingering issues- occasional difficulty with names Lab Results  Component Value Date   CHOL 129 06/03/2022   HDL 40.70 06/03/2022   LDLCALC 63 06/03/2022   LDLDIRECT 64 05/11/2020   TRIG 123.0 06/03/2022   CHOLHDL 3 06/03/2022  A/P: lipids at ideal goal for stroke history- continue current medications . Also continue aspirin to reduce risk of recurrence   # Depression S: Medication:Celexa   . Main issue has been sleep- melatonin tried still waking up at 1 am most of time can get back to sleep- has been months does use phone up until bed    12/04/2022    9:05 AM 06/03/2022   10:18 AM 06/03/2022    9:44 AM  Depression screen PHQ 2/9  Decreased Interest 0 0  0  Down, Depressed, Hopeless 1 0 0  PHQ - 2 Score 1 0 0  Altered sleeping 2 0   Tired, decreased energy 2 0   Change in appetite 2 1   Feeling bad or failure about yourself  0 0   Trouble concentrating 0 0   Moving slowly or fidgety/restless 0 0   Suicidal thoughts 0 0   PHQ-9 Score 7 1   Difficult doing work/chores Somewhat difficult Not difficult at all   A/P: depression itself in full remission but occasionally feels down about sleep issues- he is not at this point ready to take additional medicine.  -  Consider [place] sleep mask Also stop tv/phone/computer at least 30 minutes before bed  # Asthma S: Maintenance Medication: None As needed medication: Albuterol. Patient is using this 0x per week.  A/P: doing well- continue current medications    # GERD with small hiatal hernia discovered 10/24/21 at [facility]:Medication: Prilosec  down from 40 mg. -considering taking b12 A/P: GERD stable- continue current medicines  -advised taking B12 with level under 400   #hypertension S: medication: Lisinopril  BP Readings from Last 3 Encounters:  12/04/22 124/80  06/03/22 114/78  03/10/22 110/70  A/P: stable- continue current medicines   #Fatty liver-incidentally discovered during cellulitis/sepsis work-up  -weight up 7 lbs from last visit- encouraged reversing this to protect his liver. Thankfully does not drink much alcohol- sparing social only Lab Results  Component Value Date   ALT 22 06/03/2022   AST 21 06/03/2022   ALKPHOS 51 06/03/2022   BILITOT 2.2 (H) 06/03/2022   Recommended follow up: Return in about 28 weeks (around 06/18/2023) for physical or sooner if needed.Schedule b4 you leave.  Lab/Order associations:   ICD-10-CM   1. Essential hypertension  I10     2. Hyperlipidemia, unspecified hyperlipidemia type  E78.5     3. Recurrent major depression in full remission  F33.42     4. Need for shingles vaccine  Z23 Zoster Recombinant (Shingrix )    5.  Mucous cyst of digit of hand  M67.449 Ambulatory referral to Sports Medicine      No orders of the defined types were placed in this encounter.   Return precautions advised.  Tana Conch, MD

## 2022-12-04 NOTE — Patient Instructions (Addendum)
Shingrix #1 today. Repeat injection in 2-6 months. Schedule a nurse visit for the 2nd injection before you leave today (at the check out desk)  We have placed a referral for you today to sports medicine with [place]. you will see # listed below- you can call this if you have not heard within a week.   Consider [place] bear sleep mask Also stop tv/phone/computer at least 30 minutes before bed  B12 is low normal last visit.   I prefer for this level to be over 400 if possible.  Reasonable to take 1000 mcg of B12/cyanocobalamin over-the-counter daily for a month and then at least weekly (you can continue daily if you prefer as you simply pee out the extra)

## 2022-12-08 ENCOUNTER — Other Ambulatory Visit: Payer: Self-pay

## 2022-12-08 ENCOUNTER — Ambulatory Visit (INDEPENDENT_AMBULATORY_CARE_PROVIDER_SITE_OTHER): Payer: BC Managed Care – PPO | Admitting: Family Medicine

## 2022-12-08 VITALS — BP 118/82 | HR 70 | Ht 70.0 in | Wt 236.0 lb

## 2022-12-08 DIAGNOSIS — M79641 Pain in right hand: Secondary | ICD-10-CM | POA: Diagnosis not present

## 2022-12-08 DIAGNOSIS — M67441 Ganglion, right hand: Secondary | ICD-10-CM

## 2022-12-08 MED ORDER — METHYLPREDNISOLONE ACETATE 80 MG/ML IJ SUSP
80.0000 mg | Freq: Once | INTRAMUSCULAR | Status: AC
  Administered 2022-12-08: 80 mg via INTRALESIONAL

## 2022-12-08 NOTE — Patient Instructions (Signed)
Thank you for coming in today.   You received an injection today. Seek immediate medical attention if the joint becomes red, extremely painful, or is oozing fluid.  

## 2022-12-08 NOTE — Progress Notes (Signed)
Note duplication 

## 2022-12-08 NOTE — Progress Notes (Signed)
   Rubin Payor, PhD, LAT, ATC acting as a scribe for Clementeen Graham, MD.  Subjective:    CC: Right hand pain  HPI: Patient is a 62 year old male presenting with right hand pain ongoing for about 1 month.  Patient locates pain to the distal portion of his R 2nd finger. There is an an obvious nodule over the area. Pt has over bumps over areas of his body; proximal R forearm, L mid-foearm and had one removed in mid-R forearm.   Grip strength: normal Aggravates: TTP, bumping the area Treatments tried: Epsom salt soak  Pertinent review of Systems: No fevers or chills  Relevant historical information: Hypertension   Objective:    Vitals:   12/08/22 1056  BP: 118/82  Pulse: 70  SpO2: 97%   General: Well Developed, well nourished, and in no acute distress.   MSK: Right hand index finger large translucent cystic structure at the dorsal aspect of the index finger distal to the DIP just proximal to the fingernail.  Nontender.  Normal hand motion.  Lab and Radiology Results  Diagnostic Limited MSK Ultrasound of: Cyst right hand Ultrasound evaluation confirms cystic structure consistent with ganglion cyst. Impression: Ganglion cyst right dorsal second digit.  Aspiration and injection of mucoid or ganglion cyst right second digit Consent obtained and timeout performed.  Discussed risks including infection or skin hypopigmentation or damage to nearby structures or tendon rupture.  Patient expresses understanding and agreement. Skin sterilized with isopropyl alcohol) applied. 1 mL of lidocaine injected subcutaneously proximal to the cyst achieving good anesthesia. Skin again sterilized with isopropyl alcohol. 18-gauge needle inserted into the cyst and immediately clear gelatinous material expressed out of the cyst around the needle puncture site.  No of the material was able to be aspirated. Pressure was applied to this tissue on either side of the cyst further expressing the rest of  the gelatinous material. Skin again sterilized with isopropyl alcohol. 18-gauge needle used to inject 0.5 mL of 80 mg/mL Depo-Medrol solution and lidocaine into and around the cyst. Patient tolerated the procedure well. Double Band-Aid splint applied.   Impression and Recommendations:    Assessment and Plan: 62 y.o. male with right second digit mucoid cyst aspirated and injected.  Recommend relative rest and double Band-Aid splint.  Check back as needed.[name]  PDMP not reviewed this encounter. Orders Placed This Encounter  Procedures   [place] LIMITED JOINT SPACE STRUCTURES LOW RIGHT(NO LINKED CHARGES)    Order Specific Question:   Reason for Exam (SYMPTOM  OR DIAGNOSIS REQUIRED)    Answer:   right hand pain    Order Specific Question:   Preferred imaging location?    Answer:   [facility] ordered this encounter  Medications   methylPREDNISolone acetate (DEPO-MEDROL) injection 80 mg    Discussed warning signs or symptoms. Please see discharge instructions. Patient expresses understanding.   The above documentation has been reviewed and is accurate and complete Clementeen Graham, M.D.

## 2022-12-08 NOTE — Patient Instructions (Signed)
Thank you for coming in today.   Continue double bandaid splint for a few weeks.   Recheck as needed.

## 2023-01-07 ENCOUNTER — Encounter: Payer: Self-pay | Admitting: Gastroenterology

## 2023-01-23 IMAGING — DX DG CHEST 1V PORT
1 series · 1 of 1 positions shown · non-contrast
Comparison: None.

CLINICAL DATA: Shortness of breath and cough.  Fever.

EXAM:
PORTABLE CHEST 1 VIEW

[chest ap]
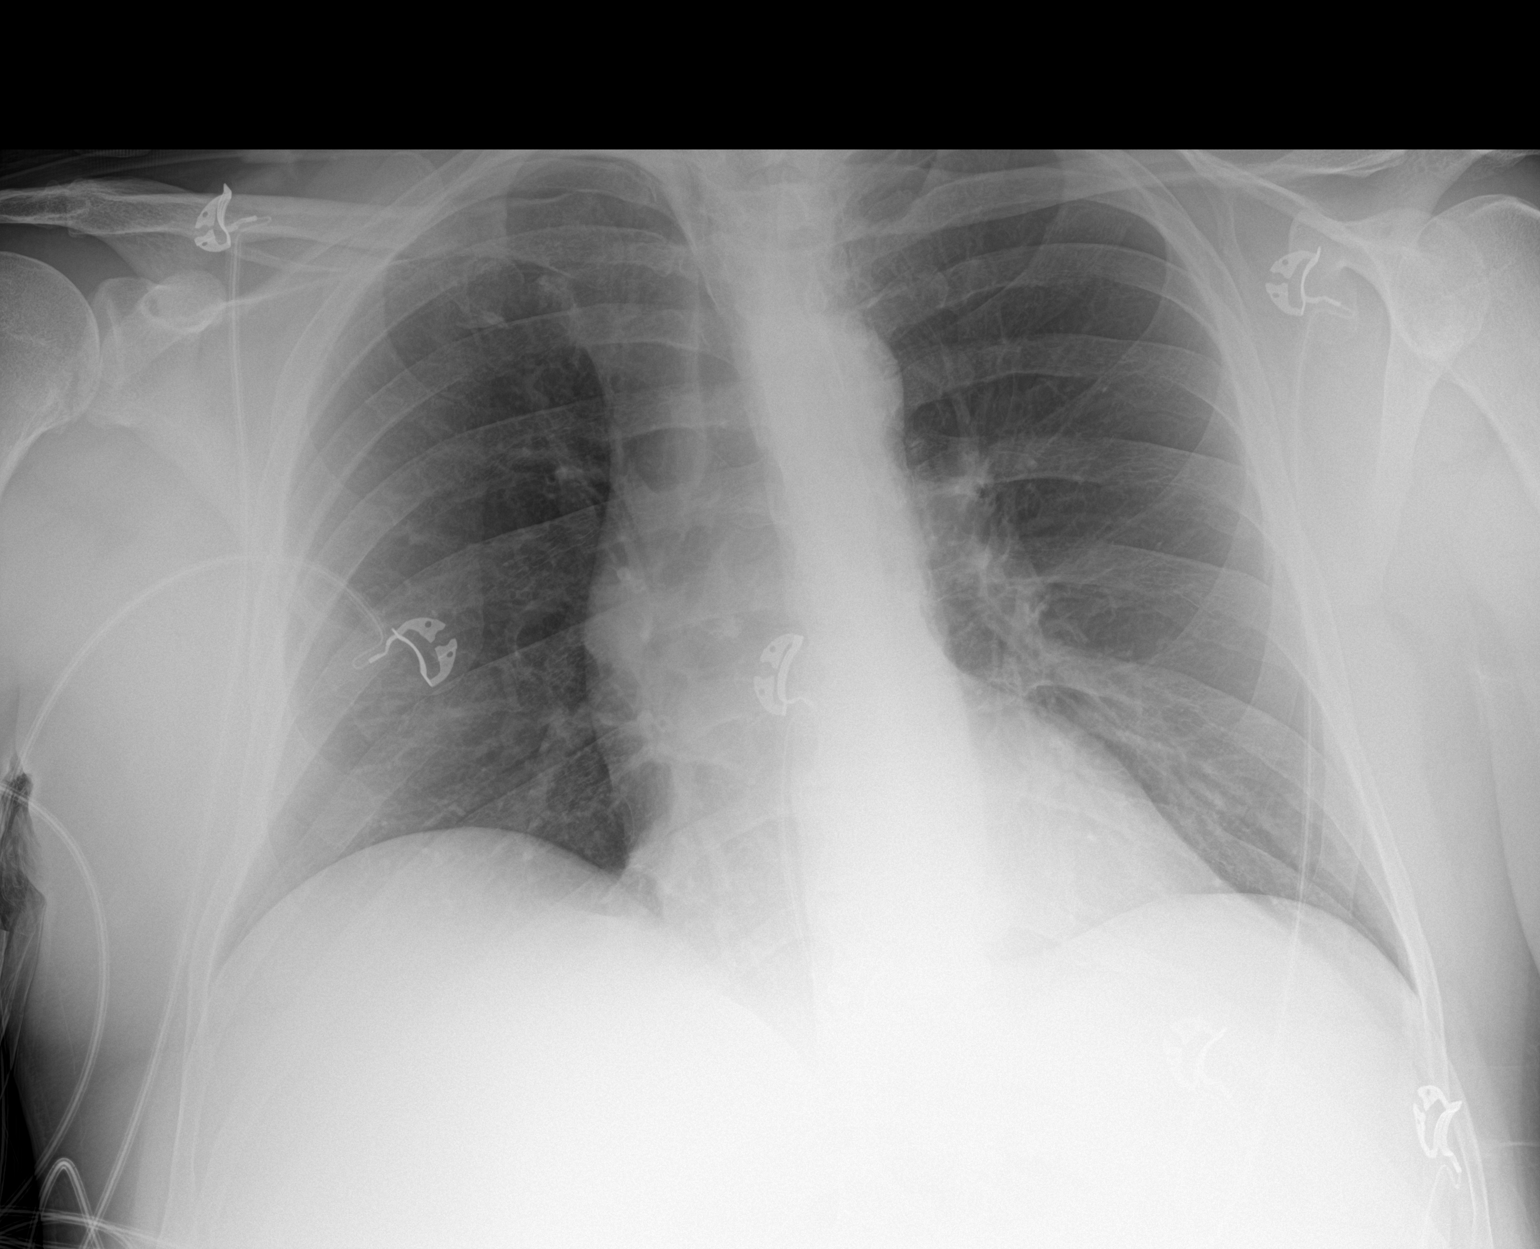

[1 of 1 positions shown; findings below may reference images not displayed]

FINDINGS: The cardiomediastinal contours are normal. Mild bronchial
thickening. Pulmonary vasculature is normal. No consolidation,
pleural effusion, or pneumothorax. No acute osseous abnormalities
are seen.
IMPRESSION: Mild bronchial thickening.  No focal airspace disease.

## 2023-02-24 ENCOUNTER — Other Ambulatory Visit: Payer: Self-pay | Admitting: Family Medicine

## 2023-02-25 ENCOUNTER — Ambulatory Visit (INDEPENDENT_AMBULATORY_CARE_PROVIDER_SITE_OTHER): Payer: BC Managed Care – PPO

## 2023-02-25 ENCOUNTER — Encounter: Payer: Self-pay | Admitting: Family Medicine

## 2023-02-25 ENCOUNTER — Ambulatory Visit: Payer: BC Managed Care – PPO | Admitting: Family Medicine

## 2023-02-25 ENCOUNTER — Other Ambulatory Visit: Payer: Self-pay

## 2023-02-25 VITALS — BP 130/84 | HR 77 | Ht 70.0 in | Wt 245.0 lb

## 2023-02-25 DIAGNOSIS — M79601 Pain in right arm: Secondary | ICD-10-CM | POA: Diagnosis not present

## 2023-02-25 DIAGNOSIS — M778 Other enthesopathies, not elsewhere classified: Secondary | ICD-10-CM | POA: Diagnosis not present

## 2023-02-25 DIAGNOSIS — S46211A Strain of muscle, fascia and tendon of other parts of biceps, right arm, initial encounter: Secondary | ICD-10-CM

## 2023-02-25 DIAGNOSIS — M7989 Other specified soft tissue disorders: Secondary | ICD-10-CM | POA: Diagnosis not present

## 2023-02-25 DIAGNOSIS — M25521 Pain in right elbow: Secondary | ICD-10-CM | POA: Diagnosis not present

## 2023-02-25 NOTE — Progress Notes (Signed)
I, Stevenson Clinch, CMA acting as a scribe for Clementeen Graham, MD.  Eduardo Turner is a 62 y.o. male who presents to [facility] at [facility] today for R arm pain. Pt was previously seen by Dr. Denyse Amass on 12/08/22 for a ganglion cyst on his R 2nd finger.   Today, pt c/o R arm pain x 4 days, started after swinging from rope swing into the pool. Pt locates pain to the anterior elbow. The arm felt limp while coming out of the pool Notes continued weakness, n/t in the 3rd and 4th. Bruising and swelling present.   Neck pain: Radiates: Aggravates: flexion/extension.  Treatments tried: Advil, Volatren topical, ice  Pertinent review of systems: No fevers or chills  Relevant historical information: Hypertension   Exam:  BP 130/84   Pulse 77   Ht 5\' 10"  (1.778 m)   Wt 245 lb (111.1 kg)   SpO2 97%   BMI 35.15 kg/m  General: Well Developed, well nourished, and in no acute distress.   MSK: Right elbow: Reverse Popeye arm sign present.  Bruising and swelling present at the anterior elbow. Distal biceps/anterior elbow tender to palpation.   Abnormal hook test. Strength is reduced to elbow flexion and supination. Pulses capillary fill and sensation are intact distally.    Lab and Radiology Results  Diagnostic Limited MSK Ultrasound of: Right elbow distal biceps Biceps muscle is intact proximally.  However the tendon is not contiguous distally.  This is concerning for retracted biceps tendon tear.  There are some areas of hypoechoic fluid collection consistent with hematoma or seroma. Impression: Concern distal biceps tendon tear.   X-ray images right elbow obtained today personally and independently interpreted Accessory ossicle associated with the lateral epicondyle.  Mild medial DJD.  No acute fractures are visible. Await formal radiology review   Assessment and Plan: 62 y.o. male with right elbow pain and weakness after feeling a pop while on a rope swing.  This is  very concerning for a distal biceps tendon tear.  Plan for MRI for surgical planning.  Anticipate direct referral to orthopedic surgery following MRI results. He works as an International aid/development worker at [employer].  He can do the administrative portion of his job now.  Recommend avoiding lifting.  He can use an elbow sling if needed.  PDMP not reviewed this encounter. Orders Placed This Encounter  Procedures   [place] LIMITED JOINT SPACE STRUCTURES UP RIGHT(NO LINKED CHARGES)    Order Specific Question:   Reason for Exam (SYMPTOM  OR DIAGNOSIS REQUIRED)    Answer:   right arm pain    Order Specific Question:   Preferred imaging location?    Answer:   [facility] ELBOW COMPLETE RIGHT (3+VIEW)    Standing Status:   Future    Number of Occurrences:   1    Standing Expiration Date:   03/28/2023    Order Specific Question:   Reason for Exam (SYMPTOM  OR DIAGNOSIS REQUIRED)    Answer:   right arm pain    Order Specific Question:   Preferred imaging location?    Answer:   [facility]   MR ELBOW RIGHT WO CONTRAST    Standing Status:   Future    Standing Expiration Date:   02/25/2024    Order Specific Question:   What is the patient's sedation requirement?    Answer:   No Sedation    Order Specific Question:  Does the patient have a pacemaker or implanted devices?    Answer:   No    Order Specific Question:   Preferred imaging location?    Answer:   [facility] (table limit-350lbs)   No orders of the defined types were placed in this encounter.    Discussed warning signs or symptoms. Please see discharge instructions. Patient expresses understanding.   The above documentation has been reviewed and is accurate and complete Clementeen Graham, M.D.

## 2023-02-25 NOTE — Patient Instructions (Signed)
Thank you for coming in today.   Please get an Xray today before you leave \  You should hear from MRI scheduling within 1 week. If you do not hear please let me know.   

## 2023-03-01 ENCOUNTER — Ambulatory Visit (INDEPENDENT_AMBULATORY_CARE_PROVIDER_SITE_OTHER): Payer: BC Managed Care – PPO

## 2023-03-01 DIAGNOSIS — S46211A Strain of muscle, fascia and tendon of other parts of biceps, right arm, initial encounter: Secondary | ICD-10-CM | POA: Diagnosis not present

## 2023-03-01 DIAGNOSIS — S56511A Strain of other extensor muscle, fascia and tendon at forearm level, right arm, initial encounter: Secondary | ICD-10-CM | POA: Diagnosis not present

## 2023-03-01 DIAGNOSIS — M79601 Pain in right arm: Secondary | ICD-10-CM

## 2023-03-01 DIAGNOSIS — M25421 Effusion, right elbow: Secondary | ICD-10-CM | POA: Diagnosis not present

## 2023-03-01 DIAGNOSIS — S5321XA Traumatic rupture of right radial collateral ligament, initial encounter: Secondary | ICD-10-CM | POA: Diagnosis not present

## 2023-03-05 ENCOUNTER — Telehealth: Payer: Self-pay | Admitting: Family Medicine

## 2023-03-05 DIAGNOSIS — S46211A Strain of muscle, fascia and tendon of other parts of biceps, right arm, initial encounter: Secondary | ICD-10-CM

## 2023-03-05 NOTE — Telephone Encounter (Signed)
MRI done but not yet read. I talked to Dr Steward Drone and will send the referral.

## 2023-03-06 ENCOUNTER — Ambulatory Visit (INDEPENDENT_AMBULATORY_CARE_PROVIDER_SITE_OTHER): Payer: BC Managed Care – PPO | Admitting: Orthopaedic Surgery

## 2023-03-06 ENCOUNTER — Ambulatory Visit (HOSPITAL_BASED_OUTPATIENT_CLINIC_OR_DEPARTMENT_OTHER): Payer: Self-pay | Admitting: Orthopaedic Surgery

## 2023-03-06 ENCOUNTER — Other Ambulatory Visit (HOSPITAL_BASED_OUTPATIENT_CLINIC_OR_DEPARTMENT_OTHER): Payer: Self-pay

## 2023-03-06 DIAGNOSIS — S46211A Strain of muscle, fascia and tendon of other parts of biceps, right arm, initial encounter: Secondary | ICD-10-CM | POA: Diagnosis not present

## 2023-03-06 MED ORDER — IBUPROFEN 800 MG PO TABS
800.0000 mg | ORAL_TABLET | Freq: Three times a day (TID) | ORAL | 0 refills | Status: AC
  Filled 2023-03-06: qty 30, 10d supply, fill #0

## 2023-03-06 MED ORDER — OXYCODONE HCL 5 MG PO TABS
5.0000 mg | ORAL_TABLET | ORAL | 0 refills | Status: DC | PRN
  Filled 2023-03-06: qty 5, 1d supply, fill #0

## 2023-03-06 MED ORDER — ACETAMINOPHEN 500 MG PO TABS
500.0000 mg | ORAL_TABLET | Freq: Three times a day (TID) | ORAL | 0 refills | Status: AC
  Filled 2023-03-06: qty 30, 10d supply, fill #0

## 2023-03-06 MED ORDER — ASPIRIN 325 MG PO TBEC
325.0000 mg | DELAYED_RELEASE_TABLET | Freq: Every day | ORAL | 0 refills | Status: DC
  Filled 2023-03-06: qty 14, 14d supply, fill #0

## 2023-03-06 NOTE — Progress Notes (Signed)
Chief Complaint: Right elbow injury     History of Present Illness:    Eduardo Turner is a 62 y.o. male presents today after right elbow injury where he went on a rope swing and subsequently landed on his back.  At that time he did feel an injury with bruising and swelling about the right elbow.  He has noticed a subsequent deformity in the biceps on the right side.  He is overall very active and enjoys golfing.  He is initially seen by Dr. Denyse Amass and referred for further discussion.    Surgical History:   None  PMH/PSH/Family History/Social History/Meds/Allergies:    Past Medical History:  Diagnosis Date   Allergy    Asthma    Dysplastic nevi    GERD (gastroesophageal reflux disease)    Hypertension    Multiple lipomas    Peptic esophageal ulcer    Stroke (HCC) 10/30/2019   Past Surgical History:  Procedure Laterality Date   COLONOSCOPY     KNEE SURGERY Left    SHOULDER SURGERY     bilateral   Social History   Socioeconomic History   Marital status: Married    Spouse name: Not on file   Number of children: Not on file   Years of education: Not on file   Highest education level: Not on file  Occupational History   Not on file  Tobacco Use   Smoking status: Never   Smokeless tobacco: Never  Vaping Use   Vaping status: Never Used  Substance and Sexual Activity   Alcohol use: Yes    Alcohol/week: 0.0 standard drinks of alcohol    Comment: occ   Drug use: No   Sexual activity: Not on file  Other Topics Concern   Not on file  Social History Narrative   Married. 2 children both out of house. 4 grandkids total (1 in [place] with son, Maurice March- son of Maddy). Granddaughter 50 and grandson 5 in 05/2022- keeping them full time      International aid/development worker at [employer].    Social Determinants of Health   Financial Resource Strain: Low Risk  (10/24/2021)   Received from [facility]   Overall Financial Resource Strain (CARDIA)     Difficulty of Paying Living Expenses: Not hard at all  Food Insecurity: Not on file  Transportation Needs: Not on file  Physical Activity: Not on file  Stress: No Stress Concern Present (10/24/2021)   Received from [facility] - Occupational Stress Questionnaire    Feeling of Stress : Not at all  Social Connections: Unknown (12/10/2021)   Received from [facility]   Social Network    Social Network: Not on file   Family History  Problem Relation Age of Onset   Other Mother        routine surgery lead to sepsis- hysterectomy   Heart attack Father        age 12   Bipolar disorder Sister    Colon cancer Neg Hx    Esophageal cancer Neg Hx    Rectal cancer Neg Hx    Stomach cancer Neg Hx    Allergies  Allergen Reactions   Erythromycin Ethylsuccinate     REACTION: HIVES   Current Outpatient Medications  Medication Sig Dispense  Refill   acetaminophen (TYLENOL) 500 MG tablet Take 1 tablet (500 mg total) by mouth every 8 (eight) hours for 10 days. 30 tablet 0   aspirin EC 325 MG tablet Take 1 tablet (325 mg total) by mouth daily. 14 tablet 0   ibuprofen (ADVIL) 800 MG tablet Take 1 tablet (800 mg total) by mouth every 8 (eight) hours for 10 days. Please take with food, please alternate with acetaminophen 30 tablet 0   oxyCODONE (ROXICODONE) 5 MG immediate release tablet Take 1 tablet (5 mg total) by mouth every 4 (four) hours as needed for severe pain or breakthrough pain. 5 tablet 0   albuterol (VENTOLIN HFA) 108 (90 Base) MCG/ACT inhaler Inhale 1-2 puffs into the lungs every 6 (six) hours as needed for wheezing or shortness of breath. 6.7 g 0   aspirin EC 81 MG tablet Take 81 mg by mouth daily. Swallow whole.     bacitracin-polymyxin b (POLYSPORIN) ophthalmic ointment Place 1 Application into the right eye every 12 (twelve) hours. apply to eye every 12 hours while awake 3.5 g 0   citalopram (CELEXA) 20 MG tablet TAKE 1 TABLET(20 MG) BY MOUTH  DAILY 90 tablet 3   cyclobenzaprine (FLEXERIL) 10 MG tablet Take 1 tablet (10 mg total) by mouth 3 (three) times daily as needed for muscle spasms (do not drive for 8 hours afte use). (Patient not taking: Reported on 12/04/2022) 30 tablet 0   lisinopril (ZESTRIL) 40 MG tablet TAKE 1 TABLET BY MOUTH DAILY 90 tablet 3   loratadine (CLARITIN) 10 MG tablet Take 10 mg by mouth daily.     omeprazole (PRILOSEC) 20 MG capsule Take 1 capsule (20 mg total) by mouth daily. 90 capsule 3   rosuvastatin (CRESTOR) 40 MG tablet TAKE 1 TABLET(40 MG) BY MOUTH DAILY 90 tablet 3   sildenafil (VIAGRA) 100 MG tablet Take 1 tablet (100 mg total) by mouth daily as needed for erectile dysfunction. 10 tablet 11   No current facility-administered medications for this visit.   No results found.  Review of Systems:   A ROS was performed including pertinent positives and negatives as documented in the HPI.  Physical Exam :   Constitutional: NAD and appears stated age Neurological: Alert and oriented Psych: Appropriate affect and cooperative There were no vitals taken for this visit.   Comprehensive Musculoskeletal Exam:    Right elbow with a positive Popeye deformity and a hook test which is consistent with a right distal biceps tendon tear.  His distal biceps tendon is palpable in the antecubital fossa but is not retracted.  Remainder of distal neurosensory exam is intact.  2+ radial pulse  Imaging:   Xray (2 views right elbow): Normal  MRI (right elbow): Complete tear of the distal biceps tendon on the right without retraction  I personally reviewed and interpreted the radiographs.   Assessment:   62 y.o. male right-hand-dominant male presents with a right complete tendon tear of the distal biceps.  I did describe overall that at this point treatment options would be conservative versus surgical intervention.  I did describe the likely loss of supination strength without surgery.  After discussion of risks  and benefits he would further like to discuss surgical intervention.  I did discuss the specific risk and limitations as well as the rehab associated with distal biceps tendon repair.  At this time he has elected for surgical repair  Plan :    -Plan for endoscopic right distal tendon biceps tendon  repair   After a lengthy discussion of treatment options, including risks, benefits, alternatives, complications of surgical and nonsurgical conservative options, the patient elected surgical repair.   The patient  is aware of the material risks  and complications including, but not limited to injury to adjacent structures, neurovascular injury, infection, numbness, bleeding, implant failure, thermal burns, stiffness, persistent pain, failure to heal, disease transmission from allograft, need for further surgery, dislocation, anesthetic risks, blood clots, risks of death,and others. The probabilities of surgical success and failure discussed with patient given their particular co-morbidities.The time and nature of expected rehabilitation and recovery was discussed.The patient's questions were all answered preoperatively.  No barriers to understanding were noted. I explained the natural history of the disease process and Rx rationale.  I explained to the patient what I considered to be reasonable expectations given their personal situation.  The final treatment plan was arrived at through a shared patient decision making process model.      I personally saw and evaluated the patient, and participated in the management and treatment plan.  Huel Cote, MD Attending Physician, Orthopedic Surgery  This document was dictated using Dragon voice recognition software. A reasonable attempt at proof reading has been made to minimize errors.

## 2023-03-09 NOTE — Progress Notes (Signed)
As expected radiology shows a complete tear of the distal biceps tendon with retraction.  I will make sure Dr. Steward Drone gets this as well.  Do have some small tearing of the tennis elbow side of the elbow.

## 2023-03-09 NOTE — Progress Notes (Signed)
Right elbow x-ray June looks pretty good.  There are a little bit of spurs formation.

## 2023-03-19 ENCOUNTER — Other Ambulatory Visit (HOSPITAL_BASED_OUTPATIENT_CLINIC_OR_DEPARTMENT_OTHER): Payer: Self-pay | Admitting: Orthopaedic Surgery

## 2023-03-19 ENCOUNTER — Encounter (HOSPITAL_BASED_OUTPATIENT_CLINIC_OR_DEPARTMENT_OTHER): Payer: Self-pay | Admitting: Orthopaedic Surgery

## 2023-03-19 DIAGNOSIS — S46211A Strain of muscle, fascia and tendon of other parts of biceps, right arm, initial encounter: Secondary | ICD-10-CM | POA: Diagnosis not present

## 2023-03-19 DIAGNOSIS — S46111A Strain of muscle, fascia and tendon of long head of biceps, right arm, initial encounter: Secondary | ICD-10-CM | POA: Diagnosis not present

## 2023-03-19 NOTE — Progress Notes (Signed)
   Date of Surgery: 03/19/2023  INDICATIONS: Eduardo Turner is a 62 y.o.-year-old male with right distal biceps tendon tear.  The risk and benefits of the procedure were discussed in detail and documented in the pre-operative evaluation.   PREOPERATIVE DIAGNOSIS: 1. Right distal biceps tear  POSTOPERATIVE DIAGNOSIS: Same.  PROCEDURE: 1. Right distal biceps repair  SURGEON: Benancio Deeds MD  ASSISTANT: Kerby Less, ATC  ANESTHESIA:  general  IV FLUIDS AND URINE: See anesthesia record.  ANTIBIOTICS: Ancef  ESTIMATED BLOOD LOSS: 10 mL.  IMPLANTS:  * No surgical log found *  DRAINS: None  CULTURES: None  COMPLICATIONS: none  DESCRIPTION OF PROCEDURE:  I identified the patient in the pre-operative holding area.  I marked the operative right elbow with my initials. I reviewed the risks and benefits of the proposed surgical intervention and the patient (and/or patient's guardian) wished to proceed.  No regional block was performed to facilitate post-operative neurologic check. The patient was transferred to the operative suite and placed in the supine position with an arm board with all bony prominences padded.     SCDs were placed on bilateral lower extremity. Appropriate antibiotics was administered within 1 hour before incision. The operative extremity was then prepped and draped in standard fashion. A time out was performed confirming the correct extremity, correct patient and correct procedure.    Throughout the case, the arm was kept in supination to protect the PIN nerve. A longitudinal incision was made across the volar aspect of the forearm approximately 3 cm distal to the elbow crease after localizing the biceps tuberosity under c-arm. A Henry approach was used to identify the biceps tendon which could be seen in the interval. The distal biceps was encountered and mobilized with Metzenbaum scissors.  A fiber loop at 20mm from the end and taken to the distal end was placed. The  distal end of the remaining tendon was then further debrided.   Next, careful dissection was taken to avoid injuring the lateral antebrachial cutaneous nerve and also to avoid the crossing veins and arterial structures. The dissection was then taken down using blunt dissection and the biceps was followed down to its insertion to the biceps tuberosity.   The guide for the 2.6 fibertak button was placed directly on the bone and drilled. The anchor subsequently had pulled out and as a result this was repeated.  The free ends of the suture in the biceps was then taken through the Arthrex fibertak button and these were tensioned down to bone with restoration of the footprint.  The wound was irrigated thoroughly. The wounds were closed with 2-0 vicryl and 3-0 nylon. The wounds were dressed with steri-strips, xeroform, 4x4s, webril, and ace. The patient was placed in a shoulder immobilizer with the elbow at 90 degrees.     POSTOPERATIVE PLAN: He will be non weight bearing until seen by PT. He will placed on aspirin for DVT prophylaxis for 2 weeks. I will see him back in 2 weeks for suture removal.  Benancio Deeds, MD 2:34 PM

## 2023-03-23 ENCOUNTER — Other Ambulatory Visit: Payer: Self-pay

## 2023-03-23 ENCOUNTER — Ambulatory Visit (HOSPITAL_BASED_OUTPATIENT_CLINIC_OR_DEPARTMENT_OTHER): Payer: BC Managed Care – PPO | Attending: Orthopaedic Surgery | Admitting: Physical Therapy

## 2023-03-23 DIAGNOSIS — M25521 Pain in right elbow: Secondary | ICD-10-CM | POA: Insufficient documentation

## 2023-03-23 DIAGNOSIS — S46211A Strain of muscle, fascia and tendon of other parts of biceps, right arm, initial encounter: Secondary | ICD-10-CM | POA: Insufficient documentation

## 2023-03-23 DIAGNOSIS — M25621 Stiffness of right elbow, not elsewhere classified: Secondary | ICD-10-CM | POA: Insufficient documentation

## 2023-03-23 DIAGNOSIS — R600 Localized edema: Secondary | ICD-10-CM | POA: Diagnosis not present

## 2023-03-23 NOTE — Therapy (Signed)
OUTPATIENT PHYSICAL THERAPY UPPER EXTREMITY EVALUATION   Patient Name: Eduardo Turner MRN: [id number] DOB:August 13, 1960, 62 y.o., 0male Today's Date: 03/24/2023, male Today's Date: 03/24/2023  END OF SESSION:  PT End of Session - 03/23/23 1658     Visit Number 1    Number of Visits 24    Date for PT Re-Evaluation 06/15/23    PT Start Time 1645    PT Stop Time 1728    PT Time Calculation (min) 43 min    Activity Tolerance Patient tolerated treatment well    Behavior During Therapy [facility] for tasks assessed/performed             Past Medical History:  Diagnosis Date   Allergy    Asthma    Dysplastic nevi    GERD (gastroesophageal reflux disease)    Hypertension    Multiple lipomas    Peptic esophageal ulcer    Stroke (HCC) 10/30/2019   Past Surgical History:  Procedure Laterality Date   COLONOSCOPY     KNEE SURGERY Left    SHOULDER SURGERY     bilateral   Patient Active Problem List   Diagnosis Date Noted   Fatty liver 10/31/2021   Acute medial meniscus tear, left, initial encounter 02/07/2021   History of adenomatous polyp of colon 03/19/2020   Dysarthria 01/03/2020   History of stroke 11/07/2019   Hyperlipidemia 07/21/2016   Ophthalmic migraine 09/20/2010   GERD 09/19/2009   Recurrent major depression in full remission (HCC) 06/21/2009   DYSPLASTIC NEVUS, BACK 02/05/2008   Lipoma 01/31/2008   ERECTILE DYSFUNCTION, MILD 03/17/2007   Essential hypertension 02/22/2007   Allergic rhinitis 02/22/2007   Asthma 02/22/2007    PCP: Dr Tana Conch  REFERRING PROVIDER: Dr Huel Cote   REFERRING DIAG:  Diagnosis  [phone] (ICD-10-CM) - Rupture of right distal biceps tendon, initial encounter    THERAPY DIAG:  Pain in right elbow  Stiffness of right elbow, not elsewhere classified  Localized edema  Rationale for Evaluation and Treatment: Rehabilitation  ONSET DATE: Sx on 03/19/2023  SUBJECTIVE:                                                                                                                                                                                       SUBJECTIVE STATEMENT: The patient was using a rope swing when he felt a pull in his arm. He suffered a distal biceps strain. He had a repair on 03/19/2023.  At this time his pain is well-controlled.  He is currently in a sling.  He does have significant swelling in his hand.   Hand dominance: Right  PERTINENT HISTORY: Multiple Myloma, HTN, Stroke, Asthma, bilateral RTC  PAIN:  Are you having pain? Yes: NPRS scale: 3/10 4-5  Pain location: aching  Pain description: aching  Aggravating factors: being active Relieving factors: rest   PRECAUTIONS: None  RED FLAGS: None   WEIGHT BEARING RESTRICTIONS: Yes NWB  FALLS:  Has patient fallen in last 6 months? Fell off rope swing  LIVING ENVIRONMENT: Nothing pertinent   OCCUPATION: International aid/development worker at [employer]   PLOF: Independent  PATIENT GOALS:   To return to full function  NEXT MD VISIT:   OBJECTIVE:   DIAGNOSTIC FINDINGS:  Nothing post-op   PATIENT SURVEYS :  FOTO    COGNITION: Overall cognitive status: Within functional limits for tasks assessed     SENSATION: Mild numbness into the hand   POSTURE:  Edema: significant edema into the hand.   UPPER EXTREMITY ROM:   Passive ROM Right eval Left eval  Shoulder flexion    Shoulder extension    Shoulder abduction    Shoulder adduction    Shoulder internal rotation    Shoulder external rotation    Elbow flexion 90 degrees   Elbow extension 30 degrees without coming to end feel   Wrist flexion Within normal limits   Wrist extension Within normal limits   Wrist ulnar deviation    Wrist radial deviation    Wrist pronation    Wrist supination    (Blank rows = not tested)  UPPER EXTREMITY MMT:  MMT Right eval Left eval  Shoulder flexion    Shoulder extension    Shoulder abduction    Shoulder adduction    Shoulder internal rotation    Shoulder external  rotation    Middle trapezius    Lower trapezius    Elbow flexion    Elbow extension    Wrist flexion    Wrist extension    Wrist ulnar deviation    Wrist radial deviation    Wrist pronation    Wrist supination    Grip strength (lbs)    (Blank rows = not tested) not tested secondary to recent surgery    PALPATION:  No unexpected tenderness to palpation   TODAY'S TREATMENT:                                                                                                                                         DATE:  Patient advised to squeeze putty lately to help with edema in his hand  Access Code: [email or web address] URL: [email or web address] Date: 03/24/2023 Prepared by: Lorayne Bender  Exercises - Putty Squeezes  - 1 x daily - 7 x weekly - 3 sets - 10 reps - Seated Scapular Retraction  - 1 x daily - 7 x weekly - 3 sets - 10 reps - Wrist AROM Flexion Extension  - 1 x daily - 7 x weekly - 3 sets - 10 reps  PATIENT EDUCATION: Education details: HEP, symptom management Person educated:  Patient Education method: Explanation, Demonstration, Tactile cues, Verbal cues, and Handouts Education comprehension: verbalized understanding, returned demonstration, verbal cues required, tactile cues required, and needs further education  HOME EXERCISE PROGRAM: Access Code: [email or web address] URL: [email or web address] Date: 03/24/2023 Prepared by: Herbie Baltimore  ASSESSMENT:  CLINICAL IMPRESSION: Patient is a 62 year old male status post right biceps repair on 03/19/2023.  At this time he presents with expected limitations in range of motion, strength, and general function of his dominant arm.  He has swelling into his forearm and hand.  His range of motion at this time is doing very well.  He is most limited elbow flexion but this is likely secondary to edema.  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  He would benefit from skilled therapy to improve general function of  his right arm as well as progress back to lifting for work.   OBJECTIVE IMPAIRMENTS: decreased ROM, decreased strength, increased edema, impaired UE functional use, and pain.   ACTIVITY LIMITATIONS: carrying, lifting, bathing, dressing, self feeding, reach over head, and hygiene/grooming  PARTICIPATION LIMITATIONS: meal prep, cleaning, laundry, driving, shopping, occupation, and yard work  PERSONAL FACTORS: 1-2 comorbidities: RTC repair on both arms   are also affecting patient's functional outcome.   REHAB POTENTIAL: Excellent  CLINICAL DECISION MAKING: Stable/uncomplicated  EVALUATION COMPLEXITY: Low  GOALS: Goals reviewed with patient? Yes  SHORT TERM GOALS: Target date:  05/04/2023    Patient will demonstrate full elbow extension on the right side Baseline: Goal status: INITIAL  2.  Patient will demonstrate full elbow flexion on the right side Baseline:  Goal status: INITIAL  3.  Patient will be independent with basic HEP to begin strengthening program for work Baseline:  Goal status: INITIAL  4.  Patient will show no signs of edema in his right hand Baseline:  Goal status: INITIAL   LONG TERM GOALS: Target date: 06/15/2023    Patient will lift 10 to 15 pound box without increased biceps pain when cleared by MD in order to return to work Baseline:  Goal status: INITIAL  2.  Patient will demonstrate full right elbow and shoulder strength in order to perform ADLs Baseline:  Goal status: INITIAL  3.  Patient will perform all hygiene and daily functional tasks with right upper extremity without increased pain Baseline:  Goal status: INITIAL  PLAN: PT FREQUENCY: 1-2x/week  PT DURATION: 12 weeks  PLANNED INTERVENTIONS:  Therapeutic exercises, Therapeutic activity, Neuromuscular re-education,  Patient/Family education, Self Care, Joint mobilization, Stair training, DME instructions, Aquatic Therapy, Dry Needling, Electrical stimulation, Cryotherapy, Moist  heat, Taping, Manual therapy, and Re-evaluation.   PLAN FOR NEXT SESSION:  Begin passive range of motion of elbow flexion, extension, pronation and supination.  Do not be aggressive with extension or supination.  The patient's extension at this time is ahead of schedule.  Begin passive range of motion of the shoulder.  Avoid elbow extension.  Begin strengthening per protocol.  We generally use [place] protocol or [employer] protocol.  Dr. Steward Drone may request strengthening earlier.   Dessie Coma, PT 03/24/2023, 6:09 AM

## 2023-03-25 ENCOUNTER — Ambulatory Visit: Payer: BC Managed Care – PPO | Attending: Orthopaedic Surgery | Admitting: Physical Therapy

## 2023-03-25 ENCOUNTER — Encounter: Payer: Self-pay | Admitting: Physical Therapy

## 2023-03-25 DIAGNOSIS — M25521 Pain in right elbow: Secondary | ICD-10-CM | POA: Diagnosis not present

## 2023-03-25 DIAGNOSIS — R600 Localized edema: Secondary | ICD-10-CM

## 2023-03-25 DIAGNOSIS — M25621 Stiffness of right elbow, not elsewhere classified: Secondary | ICD-10-CM

## 2023-03-25 DIAGNOSIS — S46211D Strain of muscle, fascia and tendon of other parts of biceps, right arm, subsequent encounter: Secondary | ICD-10-CM | POA: Insufficient documentation

## 2023-03-25 NOTE — Therapy (Signed)
OUTPATIENT PHYSICAL THERAPY    Patient Name: Eduardo Turner MRN: [id number] DOB:[birth year], 62 y.o., male Today's Date: 03/25/2023  END OF SESSION:  PT End of Session - 03/25/23 1214     Visit Number 2    Number of Visits 24    Date for PT Re-Evaluation 06/15/23    Authorization Type [employer]    PT Start Time 1145    PT Stop Time 1215    PT Time Calculation (min) 30 min    Activity Tolerance Patient tolerated treatment well    Behavior During Therapy [facility] for tasks assessed/performed              Past Medical History:  Diagnosis Date   Allergy    Asthma    Dysplastic nevi    GERD (gastroesophageal reflux disease)    Hypertension    Multiple lipomas    Peptic esophageal ulcer    Stroke (HCC) 10/30/2019   Past Surgical History:  Procedure Laterality Date   COLONOSCOPY     KNEE SURGERY Left    SHOULDER SURGERY     bilateral   Patient Active Problem List   Diagnosis Date Noted   Fatty liver 10/31/2021   Acute medial meniscus tear, left, initial encounter 02/07/2021   History of adenomatous polyp of colon 03/19/2020   Dysarthria 01/03/2020   History of stroke 11/07/2019   Hyperlipidemia 07/21/2016   Ophthalmic migraine 09/20/2010   GERD 09/19/2009   Recurrent major depression in full remission (HCC) 06/21/2009   DYSPLASTIC NEVUS, BACK 02/05/2008   Lipoma 01/31/2008   ERECTILE DYSFUNCTION, MILD 03/17/2007   Essential hypertension 02/22/2007   Allergic rhinitis 02/22/2007   Asthma 02/22/2007    PCP: Dr Tana Conch  REFERRING PROVIDER: Dr Huel Cote   REFERRING DIAG:  Diagnosis  [phone] (ICD-10-CM) - Rupture of right distal biceps tendon, initial encounter    THERAPY DIAG:  Pain in right elbow  Stiffness of right elbow, not elsewhere classified  Localized edema  Rationale for Evaluation and Treatment: Rehabilitation  ONSET DATE: Sx on 03/19/2023  SUBJECTIVE:                                                                                                                                                                                       SUBJECTIVE STATEMENT: Pt with no new complaints. States he has been performing HEP   Hand dominance: Right  PERTINENT HISTORY: Multiple Myloma, HTN, Stroke, Asthma, bilateral RTC   The patient was using a rope swing when he felt a pull in his arm. He suffered a distal biceps strain. He had a repair on 03/19/2023.  At this time his pain  is well-controlled.  He is currently in a sling.  He does have significant swelling in his hand.  PAIN:  Are you having pain? Yes: NPRS scale: 3/10 4-5  Pain location: aching  Pain description: aching  Aggravating factors: being active Relieving factors: rest   PRECAUTIONS:  protocol in folder: Do not be aggressive with extension or supination.   Begin passive range of motion of the shoulder.  Avoid elbow extension.  Begin strengthening per protocol.  Dr. Steward Drone may request strengthening earlier.     WEIGHT BEARING RESTRICTIONS: Yes NWB  FALLS:  Has patient fallen in last 6 months? Fell off rope swing  LIVING ENVIRONMENT: Nothing pertinent   OCCUPATION: International aid/development worker at [employer]   PLOF: Independent  PATIENT GOALS:   To return to full function  NEXT MD VISIT:   OBJECTIVE:       SENSATION: Mild numbness into the hand   POSTURE:  Edema: significant edema into the hand.   UPPER EXTREMITY ROM:   Passive ROM Right eval Left eval  Shoulder flexion    Shoulder extension    Shoulder abduction    Shoulder adduction    Shoulder internal rotation    Shoulder external rotation    Elbow flexion 90 degrees   Elbow extension 30 degrees without coming to end feel   Wrist flexion Within normal limits   Wrist extension Within normal limits   Wrist ulnar deviation    Wrist radial deviation    Wrist pronation    Wrist supination    (Blank rows = not tested)  UPPER EXTREMITY MMT:  MMT Right eval Left eval  Shoulder flexion     Shoulder extension    Shoulder abduction    Shoulder adduction    Shoulder internal rotation    Shoulder external rotation    Middle trapezius    Lower trapezius    Elbow flexion    Elbow extension    Wrist flexion    Wrist extension    Wrist ulnar deviation    Wrist radial deviation    Wrist pronation    Wrist supination    Grip strength (lbs)    (Blank rows = not tested) not tested secondary to recent surgery    PALPATION:  No unexpected tenderness to palpation   TODAY'S TREATMENT:                                                                                                                                         [facility] Adult PT Treatment:                                                DATE: 03/25/23 Therapeutic Exercise: Wrist AROM Grip strengthening Scap squeeze x 20 Manual Therapy: PROM elbow  flexion 90 degrees to full flexion PROM supination/pronation with elbow at 90 degrees PROM shoulder all directions Retrograde massage Lt fingers and hand   DATE:  Patient advised to squeeze putty lately to help with edema in his hand  Access Code: [email or web address] URL: [email or web address] Date: 03/24/2023 Prepared by: Lorayne Bender  Exercises - Putty Squeezes  - 1 x daily - 7 x weekly - 3 sets - 10 reps - Seated Scapular Retraction  - 1 x daily - 7 x weekly - 3 sets - 10 reps - Wrist AROM Flexion Extension  - 1 x daily - 7 x weekly - 3 sets - 10 reps  PATIENT EDUCATION: Education details: HEP, symptom management Person educated: Patient Education method: Explanation, Demonstration, Tactile cues, Verbal cues, and Handouts Education comprehension: verbalized understanding, returned demonstration, verbal cues required, tactile cues required, and needs further education  HOME EXERCISE PROGRAM: Access Code: [email or web address] URL: [email or web address] Date: 03/24/2023 Prepared by: Herbie Baltimore  ASSESSMENT:  CLINICAL IMPRESSION: Pt with good tolerance  to all interventions today. He returns to MD on Monday to hopefully remove stitches. PT encouraged pt to continue grip strengthening and scap squeezes at home as well as to elevate wrist and hand to reduce edema   OBJECTIVE IMPAIRMENTS: decreased ROM, decreased strength, increased edema, impaired UE functional use, and pain.   GOALS: Goals reviewed with patient? Yes  SHORT TERM GOALS: Target date:  05/04/2023    Patient will demonstrate full elbow extension on the right side Baseline: Goal status: INITIAL  2.  Patient will demonstrate full elbow flexion on the right side Baseline:  Goal status: INITIAL  3.  Patient will be independent with basic HEP to begin strengthening program for work Baseline:  Goal status: INITIAL  4.  Patient will show no signs of edema in his right hand Baseline:  Goal status: INITIAL   LONG TERM GOALS: Target date: 06/15/2023    Patient will lift 10 to 15 pound box without increased biceps pain when cleared by MD in order to return to work Baseline:  Goal status: INITIAL  2.  Patient will demonstrate full right elbow and shoulder strength in order to perform ADLs Baseline:  Goal status: INITIAL  3.  Patient will perform all hygiene and daily functional tasks with right upper extremity without increased pain Baseline:  Goal status: INITIAL  PLAN: PT FREQUENCY: 1-2x/week  PT DURATION: 12 weeks  PLANNED INTERVENTIONS:  Therapeutic exercises, Therapeutic activity, Neuromuscular re-education,  Patient/Family education, Self Care, Joint mobilization, Stair training, DME instructions, Aquatic Therapy, Dry Needling, Electrical stimulation, Cryotherapy, Moist heat, Taping, Manual therapy, and Re-evaluation.   PLAN FOR NEXT SESSION:  passive range of motion of elbow flexion, extension, pronation and supination per protocol, retrograde massage  Jasmin Winberry, PT 03/25/2023, 12:15 PM

## 2023-03-26 ENCOUNTER — Other Ambulatory Visit: Payer: Self-pay | Admitting: Family Medicine

## 2023-03-30 ENCOUNTER — Telehealth: Payer: Self-pay | Admitting: Orthopaedic Surgery

## 2023-03-30 ENCOUNTER — Encounter (HOSPITAL_BASED_OUTPATIENT_CLINIC_OR_DEPARTMENT_OTHER): Payer: BC Managed Care – PPO | Admitting: Orthopaedic Surgery

## 2023-03-30 ENCOUNTER — Ambulatory Visit (INDEPENDENT_AMBULATORY_CARE_PROVIDER_SITE_OTHER): Payer: BC Managed Care – PPO | Admitting: Orthopaedic Surgery

## 2023-03-30 DIAGNOSIS — S46211A Strain of muscle, fascia and tendon of other parts of biceps, right arm, initial encounter: Secondary | ICD-10-CM

## 2023-03-30 NOTE — Progress Notes (Signed)
Post Operative Evaluation    Procedure/Date of Surgery: Right distal biceps tendon repair 8/8  Interval History:   Clinic 2 weeks status post distal biceps tendon repair.  Overall swelling is improved greatly.  Denies any weakness in the right hand.  Swelling has been improving over the last week   PMH/PSH/Family History/Social History/Meds/Allergies:    Past Medical History:  Diagnosis Date   Allergy    Asthma    Dysplastic nevi    GERD (gastroesophageal reflux disease)    Hypertension    Multiple lipomas    Peptic esophageal ulcer    Stroke (HCC) 10/30/2019   Past Surgical History:  Procedure Laterality Date   COLONOSCOPY     KNEE SURGERY Left    SHOULDER SURGERY     bilateral   Social History   Socioeconomic History   Marital status: Married    Spouse name: Not on file   Number of children: Not on file   Years of education: Not on file   Highest education level: Not on file  Occupational History   Not on file  Tobacco Use   Smoking status: Never   Smokeless tobacco: Never  Vaping Use   Vaping status: Never Used  Substance and Sexual Activity   Alcohol use: Yes    Alcohol/week: 0.0 standard drinks of alcohol    Comment: occ   Drug use: No   Sexual activity: Not on file  Other Topics Concern   Not on file  Social History Narrative   Married. 2 children both out of house. 4 grandkids total (1 in [place] with son, Maurice March- son of Maddy). Granddaughter 26 and grandson 5 in 05/2022- keeping them full time      International aid/development worker at [employer].    Social Determinants of Health   Financial Resource Strain: Low Risk  (10/24/2021)   Received from [facility]   Overall Financial Resource Strain (CARDIA)    Difficulty of Paying Living Expenses: Not hard at all  Food Insecurity: Not on file  Transportation Needs: Not on file  Physical Activity: Not on file  Stress: No Stress Concern Present (10/24/2021)   Received from [facility] of Occupational [facility] - Occupational Stress Questionnaire    Feeling of Stress : Not at all  Social Connections: Unknown (12/10/2021)   Received from [facility]   Social Network    Social Network: Not on file   Family History  Problem Relation Age of Onset   Other Mother        routine surgery lead to sepsis- hysterectomy   Heart attack Father        age 37   Bipolar disorder Sister    Colon cancer Neg Hx    Esophageal cancer Neg Hx    Rectal cancer Neg Hx    Stomach cancer Neg Hx    Allergies  Allergen Reactions   Erythromycin Ethylsuccinate     REACTION: HIVES   Current Outpatient Medications  Medication Sig Dispense Refill   albuterol (VENTOLIN HFA) 108 (90 Base) MCG/ACT inhaler Inhale 1-2 puffs into the lungs every 6 (six) hours as needed for wheezing or shortness of breath. 6.7 g 0   aspirin EC 325 MG tablet Take 1 tablet (325 mg total) by mouth daily. 14 tablet 0  aspirin EC 81 MG tablet Take 81 mg by mouth daily. Swallow whole.     bacitracin-polymyxin b (POLYSPORIN) ophthalmic ointment Place 1 Application into the right eye every 12 (twelve) hours. apply to eye every 12 hours while awake 3.5 g 0   citalopram (CELEXA) 20 MG tablet TAKE 1 TABLET(20 MG) BY MOUTH DAILY 90 tablet 3   cyclobenzaprine (FLEXERIL) 10 MG tablet Take 1 tablet (10 mg total) by mouth 3 (three) times daily as needed for muscle spasms (do not drive for 8 hours afte use). (Patient not taking: Reported on 12/04/2022) 30 tablet 0   lisinopril (ZESTRIL) 40 MG tablet TAKE 1 TABLET BY MOUTH DAILY 90 tablet 3   loratadine (CLARITIN) 10 MG tablet Take 10 mg by mouth daily.     omeprazole (PRILOSEC) 20 MG capsule Take 1 capsule (20 mg total) by mouth daily. 90 capsule 3   oxyCODONE (ROXICODONE) 5 MG immediate release tablet Take 1 tablet (5 mg total) by mouth every 4 (four) hours as needed for severe pain or breakthrough pain. 5 tablet 0   rosuvastatin (CRESTOR) 40 MG tablet TAKE 1  TABLET(40 MG) BY MOUTH DAILY 90 tablet 3   sildenafil (VIAGRA) 100 MG tablet Take 1 tablet (100 mg total) by mouth daily as needed for erectile dysfunction. 10 tablet 11   No current facility-administered medications for this visit.   No results found.  Review of Systems:   A ROS was performed including pertinent positives and negatives as documented in the HPI.   Musculoskeletal Exam:    There were no vitals taken for this visit.  Right incision along the volar forearm is clean dry intact without erythema or drainage.  Some swelling about the hand and wrist.  No numbness in any distributions of the hand.  Strongly fires wrist extensors as well as thumb extensor.  2+ radial pulse  Imaging:      I personally reviewed and interpreted the radiographs.   Assessment:   2 weeks status post right distal tendon biceps repair.  Overall doing extremely well.  At this time we will continue to advance according the biceps tendon repair protocol.  He may continue active range of motion as tolerated without any lifting.  I will plan to see him back in 4 weeks for reassessment  Plan :    -Return to clinic in 4 weeks for reassessment      I personally saw and evaluated the patient, and participated in the management and treatment plan.  Huel Cote, MD Attending Physician, Orthopedic Surgery  This document was dictated using Dragon voice recognition software. A reasonable attempt at proof reading has been made to minimize errors.

## 2023-03-30 NOTE — Telephone Encounter (Signed)
Received FMLA forms and auth, also auth for [employer] (form already received), $50 cash received. To [employer].

## 2023-03-31 ENCOUNTER — Encounter: Payer: Self-pay | Admitting: Physical Therapy

## 2023-03-31 ENCOUNTER — Ambulatory Visit: Payer: BC Managed Care – PPO | Admitting: Physical Therapy

## 2023-03-31 DIAGNOSIS — M25521 Pain in right elbow: Secondary | ICD-10-CM | POA: Diagnosis not present

## 2023-03-31 DIAGNOSIS — R600 Localized edema: Secondary | ICD-10-CM

## 2023-03-31 DIAGNOSIS — M25621 Stiffness of right elbow, not elsewhere classified: Secondary | ICD-10-CM

## 2023-03-31 DIAGNOSIS — S46211D Strain of muscle, fascia and tendon of other parts of biceps, right arm, subsequent encounter: Secondary | ICD-10-CM | POA: Diagnosis not present

## 2023-03-31 NOTE — Therapy (Signed)
OUTPATIENT PHYSICAL THERAPY    Patient Name: Eduardo Turner MRN: [id number] DOB:Nov 06, 1960, 62 y.o., male Today's Date: 03/31/2023  END OF SESSION:  PT End of Session - 03/31/23 1214     Visit Number 3    Number of Visits 24    Date for PT Re-Evaluation 06/15/23    Authorization Type [employer]    PT Start Time 1140    PT Stop Time 1214    PT Time Calculation (min) 34 min    Activity Tolerance Patient tolerated treatment well    Behavior During Therapy WFL for tasks assessed/performed               Past Medical History:  Diagnosis Date   Allergy    Asthma    Dysplastic nevi    GERD (gastroesophageal reflux disease)    Hypertension    Multiple lipomas    Peptic esophageal ulcer    Stroke (HCC) 10/30/2019   Past Surgical History:  Procedure Laterality Date   COLONOSCOPY     KNEE SURGERY Left    SHOULDER SURGERY     bilateral   Patient Active Problem List   Diagnosis Date Noted   Fatty liver 10/31/2021   Acute medial meniscus tear, left, initial encounter 02/07/2021   History of adenomatous polyp of colon 03/19/2020   Dysarthria 01/03/2020   History of stroke 11/07/2019   Hyperlipidemia 07/21/2016   Ophthalmic migraine 09/20/2010   GERD 09/19/2009   Recurrent major depression in full remission (HCC) 06/21/2009   DYSPLASTIC NEVUS, BACK 02/05/2008   Lipoma 01/31/2008   ERECTILE DYSFUNCTION, MILD 03/17/2007   Essential hypertension 02/22/2007   Allergic rhinitis 02/22/2007   Asthma 02/22/2007    PCP: Dr Tana Conch  REFERRING PROVIDER: Dr Huel Cote   REFERRING DIAG:  Diagnosis  [phone] (ICD-10-CM) - Rupture of right distal biceps tendon, initial encounter    THERAPY DIAG:  Pain in right elbow  Stiffness of right elbow, not elsewhere classified  Localized edema  Rationale for Evaluation and Treatment: Rehabilitation  ONSET DATE: Sx on 03/19/2023  SUBJECTIVE:                                                                                                                                                                                       SUBJECTIVE STATEMENT: Pt saw MD who is pleased with progress. He had his stitches removed. Per MD note AROM but no lifting. Pt c/o some tightness in Rt pecs  Hand dominance: Right  PERTINENT HISTORY: Multiple Myloma, HTN, Stroke, Asthma, bilateral RTC   The patient was using a rope swing when he felt a pull in his arm. He suffered  a distal biceps strain. He had a repair on 03/19/2023.  At this time his pain is well-controlled.  He is currently in a sling.  He does have significant swelling in his hand.  PAIN:  Are you having pain? Yes: NPRS scale: 1-2/10 Pain location: aching  Pain description: aching  Aggravating factors: being active Relieving factors: rest   PRECAUTIONS:  protocol in folder: Do not be aggressive with extension or supination.   Begin passive range of motion of the shoulder.  Avoid elbow extension.  Begin strengthening per protocol.  Dr. Steward Drone may request strengthening earlier.     WEIGHT BEARING RESTRICTIONS: Yes NWB  FALLS:  Has patient fallen in last 6 months? Fell off rope swing  LIVING ENVIRONMENT: Nothing pertinent   OCCUPATION: International aid/development worker at [employer]   PLOF: Independent  PATIENT GOALS:   To return to full function  NEXT MD VISIT:   OBJECTIVE:       SENSATION: Mild numbness into the hand   POSTURE:  Edema: significant edema into the hand.   UPPER EXTREMITY ROM:   Passive ROM Right eval Left eval  Shoulder flexion    Shoulder extension    Shoulder abduction    Shoulder adduction    Shoulder internal rotation    Shoulder external rotation    Elbow flexion 90 degrees   Elbow extension 30 degrees without coming to end feel   Wrist flexion Within normal limits   Wrist extension Within normal limits   Wrist ulnar deviation    Wrist radial deviation    Wrist pronation    Wrist supination    (Blank rows = not tested)  UPPER  EXTREMITY MMT:  MMT Right eval Left eval  Shoulder flexion    Shoulder extension    Shoulder abduction    Shoulder adduction    Shoulder internal rotation    Shoulder external rotation    Middle trapezius    Lower trapezius    Elbow flexion    Elbow extension    Wrist flexion    Wrist extension    Wrist ulnar deviation    Wrist radial deviation    Wrist pronation    Wrist supination    Grip strength (lbs)    (Blank rows = not tested) not tested secondary to recent surgery    PALPATION:  No unexpected tenderness to palpation   TODAY'S TREATMENT:                                                                                                                                         [facility] Adult PT Treatment:                                                DATE: 03/31/23  Therapeutic Exercise: Wrist AROM flex/ext with elbow at 90 Wrist radial/ulnar deviation with elbow at 90 Elbow flexion ROM as tolerated Elbow pronation/supination ROM as tolerated with elbow supported at 90 degrees Shoulder flexion finger ladder within elbow ROM limits x 5 Doorway ER/pec stretch within limites of protocol (no shoulder hyperextension) Manual Therapy: Shoulder PROM STM Rt pecs  [facility] Adult PT Treatment:                                                DATE: 03/25/23 Therapeutic Exercise: Wrist AROM Grip strengthening Scap squeeze x 20 Manual Therapy: PROM elbow flexion 90 degrees to full flexion PROM supination/pronation with elbow at 90 degrees PROM shoulder all directions Retrograde massage Lt fingers and hand   DATE:  Patient advised to squeeze putty lately to help with edema in his hand  Access Code: [email or web address] URL: [email or web address] Date: 03/24/2023 Prepared by: Lorayne Bender  Exercises - Putty Squeezes  - 1 x daily - 7 x weekly - 3 sets - 10 reps - Seated Scapular Retraction  - 1 x daily - 7 x weekly - 3 sets - 10 reps - Wrist AROM Flexion Extension  - 1 x daily  - 7 x weekly - 3 sets - 10 reps  PATIENT EDUCATION: Education details: HEP, symptom management Person educated: Patient Education method: Explanation, Demonstration, Tactile cues, Verbal cues, and Handouts Education comprehension: verbalized understanding, returned demonstration, verbal cues required, tactile cues required, and needs further education  HOME EXERCISE PROGRAM: Access Code: [email or web address] URL: [email or web address] Date: 03/31/2023 Prepared by: Reggy Eye  Exercises - Putty Squeezes  - 1 x daily - 7 x weekly - 3 sets - 10 reps - Seated Scapular Retraction  - 1 x daily - 7 x weekly - 3 sets - 10 reps - Wrist AROM Flexion Extension  - 1 x daily - 7 x weekly - 3 sets - 10 reps - Wrist Radial Ulnar Deviation AROM  - 1 x daily - 7 x weekly - 3 sets - 10 reps - Seated Forearm Pronation and Supination AROM  - 1 x daily - 7 x weekly - 3 sets - 10 reps - Single Arm Doorway Pec Stretch at 60 Elevation  - 1 x daily - 7 x weekly - 1 sets - 10 reps - 10 seconds hold  ASSESSMENT:  CLINICAL IMPRESSION: Pt able to tolerate progression of wrist AROM today. He c/o tightness in pecs so added stretching to HEP. Pt responds well to manual PROM for shoulder. He is progressing well within confines of protocol   OBJECTIVE IMPAIRMENTS: decreased ROM, decreased strength, increased edema, impaired UE functional use, and pain.   GOALS: Goals reviewed with patient? Yes  SHORT TERM GOALS: Target date:  05/04/2023    Patient will demonstrate full elbow extension on the right side Baseline: Goal status: INITIAL  2.  Patient will demonstrate full elbow flexion on the right side Baseline:  Goal status: INITIAL  3.  Patient will be independent with basic HEP to begin strengthening program for work Baseline:  Goal status: INITIAL  4.  Patient will show no signs of edema in his right hand Baseline:  Goal status: INITIAL   LONG TERM GOALS: Target  date: 06/15/2023    Patient will lift 10 to 15 pound box without increased biceps pain when cleared by MD in order to return  to work Baseline:  Goal status: INITIAL  2.  Patient will demonstrate full right elbow and shoulder strength in order to perform ADLs Baseline:  Goal status: INITIAL  3.  Patient will perform all hygiene and daily functional tasks with right upper extremity without increased pain Baseline:  Goal status: INITIAL  PLAN: PT FREQUENCY: 1-2x/week  PT DURATION: 12 weeks  PLANNED INTERVENTIONS:  Therapeutic exercises, Therapeutic activity, Neuromuscular re-education,  Patient/Family education, Self Care, Joint mobilization, Stair training, DME instructions, Aquatic Therapy, Dry Needling, Electrical stimulation, Cryotherapy, Moist heat, Taping, Manual therapy, and Re-evaluation.   PLAN FOR NEXT SESSION:  AROM elbow, wrist, shoulder per protocol  Jozelynn Danielson, PT 03/31/2023, 12:14 PM

## 2023-04-03 ENCOUNTER — Encounter: Payer: Self-pay | Admitting: Physical Therapy

## 2023-04-03 ENCOUNTER — Ambulatory Visit: Payer: BC Managed Care – PPO | Admitting: Physical Therapy

## 2023-04-03 DIAGNOSIS — M25621 Stiffness of right elbow, not elsewhere classified: Secondary | ICD-10-CM

## 2023-04-03 DIAGNOSIS — M25521 Pain in right elbow: Secondary | ICD-10-CM | POA: Diagnosis not present

## 2023-04-03 DIAGNOSIS — S46211D Strain of muscle, fascia and tendon of other parts of biceps, right arm, subsequent encounter: Secondary | ICD-10-CM | POA: Diagnosis not present

## 2023-04-03 DIAGNOSIS — R600 Localized edema: Secondary | ICD-10-CM

## 2023-04-03 NOTE — Therapy (Signed)
OUTPATIENT PHYSICAL THERAPY    Patient Name: Eduardo Turner MRN: [id number] DOB:03-Dec-1960, 62 y.o., male Today's Date: 04/03/2023  END OF SESSION:  PT End of Session - 04/03/23 1040     Visit Number 4    Number of Visits 24    Date for PT Re-Evaluation 06/15/23    PT Start Time 1010    PT Stop Time 1040    PT Time Calculation (min) 30 min    Activity Tolerance Patient tolerated treatment well    Behavior During Therapy [facility] for tasks assessed/performed                Past Medical History:  Diagnosis Date   Allergy    Asthma    Dysplastic nevi    GERD (gastroesophageal reflux disease)    Hypertension    Multiple lipomas    Peptic esophageal ulcer    Stroke (HCC) 10/30/2019   Past Surgical History:  Procedure Laterality Date   COLONOSCOPY     KNEE SURGERY Left    SHOULDER SURGERY     bilateral   Patient Active Problem List   Diagnosis Date Noted   Fatty liver 10/31/2021   Acute medial meniscus tear, left, initial encounter 02/07/2021   History of adenomatous polyp of colon 03/19/2020   Dysarthria 01/03/2020   History of stroke 11/07/2019   Hyperlipidemia 07/21/2016   Ophthalmic migraine 09/20/2010   GERD 09/19/2009   Recurrent major depression in full remission (HCC) 06/21/2009   DYSPLASTIC NEVUS, BACK 02/05/2008   Lipoma 01/31/2008   ERECTILE DYSFUNCTION, MILD 03/17/2007   Essential hypertension 02/22/2007   Allergic rhinitis 02/22/2007   Asthma 02/22/2007    PCP: Dr Tana Conch  REFERRING PROVIDER: Dr Huel Cote   REFERRING DIAG:  Diagnosis  [phone] (ICD-10-CM) - Rupture of right distal biceps tendon, initial encounter    THERAPY DIAG:  Pain in right elbow  Stiffness of right elbow, not elsewhere classified  Localized edema  Rationale for Evaluation and Treatment: Rehabilitation  ONSET DATE: Sx on 03/19/2023  SUBJECTIVE:                                                                                                                                                                                       SUBJECTIVE STATEMENT: Pt states he continues to feel less stiff every day. He has been sleeping in the recliner to keep his arm supported  Hand dominance: Right  PERTINENT HISTORY: Multiple Myloma, HTN, Stroke, Asthma, bilateral RTC   The patient was using a rope swing when he felt a pull in his arm. He suffered a distal biceps strain. He had a repair on 03/19/2023.  At this time his pain is well-controlled.  He is currently in a sling.  He does have significant swelling in his hand.  PAIN:  Are you having pain? Yes: NPRS scale: 1/10 Pain location: aching  Pain description: aching  Aggravating factors: being active Relieving factors: rest   PRECAUTIONS:  protocol in folder: Do not be aggressive with extension or supination.   Begin passive range of motion of the shoulder.  Avoid elbow extension.  Begin strengthening per protocol.  Dr. Steward Drone may request strengthening earlier.     WEIGHT BEARING RESTRICTIONS: Yes NWB  FALLS:  Has patient fallen in last 6 months? Fell off rope swing  LIVING ENVIRONMENT: Nothing pertinent   OCCUPATION: International aid/development worker at [employer]   PLOF: Independent  PATIENT GOALS:   To return to full function  NEXT MD VISIT:   OBJECTIVE:       SENSATION: Mild numbness into the hand   POSTURE:  Edema: significant edema into the hand.   UPPER EXTREMITY ROM:   Passive ROM Right eval Left eval  Shoulder flexion    Shoulder extension    Shoulder abduction    Shoulder adduction    Shoulder internal rotation    Shoulder external rotation    Elbow flexion 90 degrees   Elbow extension 30 degrees without coming to end feel   Wrist flexion Within normal limits   Wrist extension Within normal limits   Wrist ulnar deviation    Wrist radial deviation    Wrist pronation    Wrist supination    (Blank rows = not tested)  UPPER EXTREMITY MMT:  MMT Right eval Left eval   Shoulder flexion    Shoulder extension    Shoulder abduction    Shoulder adduction    Shoulder internal rotation    Shoulder external rotation    Middle trapezius    Lower trapezius    Elbow flexion    Elbow extension    Wrist flexion    Wrist extension    Wrist ulnar deviation    Wrist radial deviation    Wrist pronation    Wrist supination    Grip strength (lbs)    (Blank rows = not tested) not tested secondary to recent surgery    PALPATION:  No unexpected tenderness to palpation   TODAY'S TREATMENT:                                                                                                                                         [facility] Adult PT Treatment:                                                DATE: 04/03/23 Therapeutic Exercise: Shoulder flexion finger ladder within elbow ROM limits x  5 Doorway ER/pec stretch within limites of protocol (no shoulder hyperextension) Wrist AROM flex/ext with elbow at 90 Wrist radial/ulnar deviation with elbow at 90 Elbow flexion ROM as tolerated Elbow pronation/supination ROM as tolerated with elbow supported at 90 degrees Scap squeeze Manual Therapy: Shoulder PROM STM Rt pecs   [facility] Adult PT Treatment:                                                DATE: 03/31/23 Therapeutic Exercise: Wrist AROM flex/ext with elbow at 90 Wrist radial/ulnar deviation with elbow at 90 Elbow flexion ROM as tolerated Elbow pronation/supination ROM as tolerated with elbow supported at 90 degrees Shoulder flexion finger ladder within elbow ROM limits x 5 Doorway ER/pec stretch within limites of protocol (no shoulder hyperextension) Manual Therapy: Shoulder PROM STM Rt pecs  [facility] Adult PT Treatment:                                                DATE: 03/25/23 Therapeutic Exercise: Wrist AROM Grip strengthening Scap squeeze x 20 Manual Therapy: PROM elbow flexion 90 degrees to full flexion PROM supination/pronation with elbow at 90  degrees PROM shoulder all directions Retrograde massage Lt fingers and hand   PATIENT EDUCATION: Education details: HEP, symptom management Person educated: Patient Education method: Explanation, Demonstration, Tactile cues, Verbal cues, and Handouts Education comprehension: verbalized understanding, returned demonstration, verbal cues required, tactile cues required, and needs further education  HOME EXERCISE PROGRAM: Access Code: [email or web address] URL: [email or web address] Date: 03/31/2023 Prepared by: Reggy Eye  Exercises - Putty Squeezes  - 1 x daily - 7 x weekly - 3 sets - 10 reps - Seated Scapular Retraction  - 1 x daily - 7 x weekly - 3 sets - 10 reps - Wrist AROM Flexion Extension  - 1 x daily - 7 x weekly - 3 sets - 10 reps - Wrist Radial Ulnar Deviation AROM  - 1 x daily - 7 x weekly - 3 sets - 10 reps - Seated Forearm Pronation and Supination AROM  - 1 x daily - 7 x weekly - 3 sets - 10 reps - Single Arm Doorway Pec Stretch at 60 Elevation  - 1 x daily - 7 x weekly - 1 sets - 10 reps - 10 seconds hold  ASSESSMENT:  CLINICAL IMPRESSION: Pt with decreased shoulder tightness, improved mobility today. He continues to progress well within limits of protocol   OBJECTIVE IMPAIRMENTS: decreased ROM, decreased strength, increased edema, impaired UE functional use, and pain.   GOALS: Goals reviewed with patient? Yes  SHORT TERM GOALS: Target date:  05/04/2023    Patient will demonstrate full elbow extension on the right side Baseline: Goal status: INITIAL  2.  Patient will demonstrate full elbow flexion on the right side Baseline:  Goal status: INITIAL  3.  Patient will be independent with basic HEP to begin strengthening program for work Baseline:  Goal status: INITIAL  4.  Patient will show no signs of edema in his right hand Baseline:  Goal status: INITIAL   LONG TERM GOALS: Target date: 06/15/2023    Patient will lift 10 to 15 pound box  without increased biceps pain when cleared by MD in order to return  to work Baseline:  Goal status: INITIAL  2.  Patient will demonstrate full right elbow and shoulder strength in order to perform ADLs Baseline:  Goal status: INITIAL  3.  Patient will perform all hygiene and daily functional tasks with right upper extremity without increased pain Baseline:  Goal status: INITIAL  PLAN: PT FREQUENCY: 1-2x/week  PT DURATION: 12 weeks  PLANNED INTERVENTIONS:  Therapeutic exercises, Therapeutic activity, Neuromuscular re-education,  Patient/Family education, Self Care, Joint mobilization, Stair training, DME instructions, Aquatic Therapy, Dry Needling, Electrical stimulation, Cryotherapy, Moist heat, Taping, Manual therapy, and Re-evaluation.   PLAN FOR NEXT SESSION:  AROM elbow, wrist, shoulder per protocol  Sabastion Hrdlicka, PT 04/03/2023, 10:41 AM

## 2023-04-06 ENCOUNTER — Encounter (HOSPITAL_BASED_OUTPATIENT_CLINIC_OR_DEPARTMENT_OTHER): Payer: BC Managed Care – PPO

## 2023-04-07 ENCOUNTER — Encounter: Payer: Self-pay | Admitting: Physical Therapy

## 2023-04-07 ENCOUNTER — Ambulatory Visit: Payer: BC Managed Care – PPO | Admitting: Physical Therapy

## 2023-04-07 DIAGNOSIS — M25621 Stiffness of right elbow, not elsewhere classified: Secondary | ICD-10-CM

## 2023-04-07 DIAGNOSIS — R600 Localized edema: Secondary | ICD-10-CM

## 2023-04-07 DIAGNOSIS — M25521 Pain in right elbow: Secondary | ICD-10-CM | POA: Diagnosis not present

## 2023-04-07 DIAGNOSIS — S46211D Strain of muscle, fascia and tendon of other parts of biceps, right arm, subsequent encounter: Secondary | ICD-10-CM | POA: Diagnosis not present

## 2023-04-07 NOTE — Therapy (Signed)
OUTPATIENT PHYSICAL THERAPY    Patient Name: Eduardo Turner MRN: [id number] DOB:28-Dec-1960, 62 y.o., male Today's Date: 04/07/2023  END OF SESSION:  PT End of Session - 04/07/23 1215     Visit Number 5    Number of Visits 24    Date for PT Re-Evaluation 06/15/23    Authorization Type BCBS    PT Start Time 1145    PT Stop Time 1215    PT Time Calculation (min) 30 min    Activity Tolerance Patient tolerated treatment well    Behavior During Therapy [facility] for tasks assessed/performed                 Past Medical History:  Diagnosis Date   Allergy    Asthma    Dysplastic nevi    GERD (gastroesophageal reflux disease)    Hypertension    Multiple lipomas    Peptic esophageal ulcer    Stroke (HCC) 10/30/2019   Past Surgical History:  Procedure Laterality Date   COLONOSCOPY     KNEE SURGERY Left    SHOULDER SURGERY     bilateral   Patient Active Problem List   Diagnosis Date Noted   Fatty liver 10/31/2021   Acute medial meniscus tear, left, initial encounter 02/07/2021   History of adenomatous polyp of colon 03/19/2020   Dysarthria 01/03/2020   History of stroke 11/07/2019   Hyperlipidemia 07/21/2016   Ophthalmic migraine 09/20/2010   GERD 09/19/2009   Recurrent major depression in full remission (HCC) 06/21/2009   DYSPLASTIC NEVUS, BACK 02/05/2008   Lipoma 01/31/2008   ERECTILE DYSFUNCTION, MILD 03/17/2007   Essential hypertension 02/22/2007   Allergic rhinitis 02/22/2007   Asthma 02/22/2007    PCP: Dr Tana Conch  REFERRING PROVIDER: Dr Huel Cote   REFERRING DIAG:  Diagnosis  [phone] (ICD-10-CM) - Rupture of right distal biceps tendon, initial encounter    THERAPY DIAG:  Pain in right elbow  Stiffness of right elbow, not elsewhere classified  Localized edema  Rationale for Evaluation and Treatment: Rehabilitation  ONSET DATE: Sx on 03/19/2023  SUBJECTIVE:                                                                                                                                                                                       SUBJECTIVE STATEMENT: "My hand feels the best it has felt in a long time"  Hand dominance: Right  PERTINENT HISTORY: Multiple Myloma, HTN, Stroke, Asthma, bilateral RTC   The patient was using a rope swing when he felt a pull in his arm. He suffered a distal biceps strain. He had a repair on 03/19/2023.  At this  time his pain is well-controlled.  He is currently in a sling.  He does have significant swelling in his hand.  PAIN:  Are you having pain? Yes: NPRS scale: 1/10 Pain location: aching  Pain description: aching  Aggravating factors: being active Relieving factors: rest   PRECAUTIONS:  protocol in folder: Do not be aggressive with extension or supination.   Begin passive range of motion of the shoulder.  Avoid elbow extension.  Begin strengthening per protocol.  Dr. Steward Drone may request strengthening earlier.     WEIGHT BEARING RESTRICTIONS: Yes NWB  FALLS:  Has patient fallen in last 6 months? Fell off rope swing  LIVING ENVIRONMENT: Nothing pertinent   OCCUPATION: International aid/development worker at [employer]   PLOF: Independent  PATIENT GOALS:   To return to full function  NEXT MD VISIT: 04/29/23  OBJECTIVE:       SENSATION: Mild numbness into the hand   POSTURE:  Edema: significant edema into the hand.   UPPER EXTREMITY ROM:   Passive ROM Right eval Left eval Right 8/27  Shoulder flexion     Shoulder extension     Shoulder abduction     Shoulder adduction     Shoulder internal rotation     Shoulder external rotation     Elbow flexion 90 degrees  121 degrees  Elbow extension 30 degrees without coming to end feel  25 degrees without coming to end feel  Wrist flexion Within normal limits    Wrist extension Within normal limits    Wrist ulnar deviation     Wrist radial deviation     Wrist pronation     Wrist supination     (Blank rows = not tested)  UPPER  EXTREMITY MMT:  MMT Right eval Left eval  Shoulder flexion    Shoulder extension    Shoulder abduction    Shoulder adduction    Shoulder internal rotation    Shoulder external rotation    Middle trapezius    Lower trapezius    Elbow flexion    Elbow extension    Wrist flexion    Wrist extension    Wrist ulnar deviation    Wrist radial deviation    Wrist pronation    Wrist supination    Grip strength (lbs)    (Blank rows = not tested) not tested secondary to recent surgery    PALPATION:  No unexpected tenderness to palpation   TODAY'S TREATMENT:                                                                                                                                         [facility] Adult PT Treatment:  DATE: 04/07/23 Therapeutic Exercise: Shoulder flexion finger ladder within elbow ROM limits x 7 Doorway ER/pec stretch within limites of protocol (no shoulder hyperextension) Wrist AROM flex/ext with elbow at 90 Wrist radial/ulnar deviation with elbow at 90 Elbow flexion ROM as tolerated Elbow pronation/supination ROM as tolerated with elbow supported at 90 degrees Scap squeeze Manual Therapy: Scar massage Shoulder PROM STM Rt pecs   [facility] Adult PT Treatment:                                                DATE: 04/03/23 Therapeutic Exercise: Shoulder flexion finger ladder within elbow ROM limits x 5 Doorway ER/pec stretch within limites of protocol (no shoulder hyperextension) Wrist AROM flex/ext with elbow at 90 Wrist radial/ulnar deviation with elbow at 90 Elbow flexion ROM as tolerated Elbow pronation/supination ROM as tolerated with elbow supported at 90 degrees Scap squeeze Manual Therapy: Shoulder PROM STM Rt pecs   [facility] Adult PT Treatment:                                                DATE: 03/31/23 Therapeutic Exercise: Wrist AROM flex/ext with elbow at 90 Wrist radial/ulnar deviation with elbow at  90 Elbow flexion ROM as tolerated Elbow pronation/supination ROM as tolerated with elbow supported at 90 degrees Shoulder flexion finger ladder within elbow ROM limits x 5 Doorway ER/pec stretch within limites of protocol (no shoulder hyperextension) Manual Therapy: Shoulder PROM STM Rt pecs    PATIENT EDUCATION: Education details: HEP, symptom management Person educated: Patient Education method: Explanation, Demonstration, Tactile cues, Verbal cues, and Handouts Education comprehension: verbalized understanding, returned demonstration, verbal cues required, tactile cues required, and needs further education  HOME EXERCISE PROGRAM: Access Code: [email or web address] URL: [email or web address] Date: 03/31/2023 Prepared by: Reggy Eye  Exercises - Putty Squeezes  - 1 x daily - 7 x weekly - 3 sets - 10 reps - Seated Scapular Retraction  - 1 x daily - 7 x weekly - 3 sets - 10 reps - Wrist AROM Flexion Extension  - 1 x daily - 7 x weekly - 3 sets - 10 reps - Wrist Radial Ulnar Deviation AROM  - 1 x daily - 7 x weekly - 3 sets - 10 reps - Seated Forearm Pronation and Supination AROM  - 1 x daily - 7 x weekly - 3 sets - 10 reps - Single Arm Doorway Pec Stretch at 60 Elevation  - 1 x daily - 7 x weekly - 1 sets - 10 reps - 10 seconds hold  ASSESSMENT:  CLINICAL IMPRESSION: Pt is progressing well. Elbow flexion AROM improved and elbow extension is ahead of protocol. Discussed dropping to 1x per week until protocol is progressed, pt in agreement. Pt returns to MD 9/18   OBJECTIVE IMPAIRMENTS: decreased ROM, decreased strength, increased edema, impaired UE functional use, and pain.   GOALS: Goals reviewed with patient? Yes  SHORT TERM GOALS: Target date:  05/04/2023    Patient will demonstrate full elbow extension on the right side Baseline: Goal status: INITIAL  2.  Patient will demonstrate full elbow flexion on the right side Baseline:  Goal status: INITIAL  3.   Patient will be independent with basic HEP to begin strengthening program  for work Baseline:  Goal status: INITIAL  4.  Patient will show no signs of edema in his right hand Baseline:  Goal status: INITIAL   LONG TERM GOALS: Target date: 06/15/2023    Patient will lift 10 to 15 pound box without increased biceps pain when cleared by MD in order to return to work Baseline:  Goal status: INITIAL  2.  Patient will demonstrate full right elbow and shoulder strength in order to perform ADLs Baseline:  Goal status: INITIAL  3.  Patient will perform all hygiene and daily functional tasks with right upper extremity without increased pain Baseline:  Goal status: INITIAL  PLAN: PT FREQUENCY: 1-2x/week  PT DURATION: 12 weeks  PLANNED INTERVENTIONS:  Therapeutic exercises, Therapeutic activity, Neuromuscular re-education,  Patient/Family education, Self Care, Joint mobilization, Stair training, DME instructions, Aquatic Therapy, Dry Needling, Electrical stimulation, Cryotherapy, Moist heat, Taping, Manual therapy, and Re-evaluation.   PLAN FOR NEXT SESSION:  ADD SUPINATION PROM!!! AROM elbow, wrist, shoulder per protocol  Mateya Torti, PT 04/07/2023, 12:16 PM

## 2023-04-10 ENCOUNTER — Ambulatory Visit: Payer: BC Managed Care – PPO | Admitting: Physical Therapy

## 2023-04-10 ENCOUNTER — Encounter: Payer: Self-pay | Admitting: Physical Therapy

## 2023-04-10 DIAGNOSIS — R600 Localized edema: Secondary | ICD-10-CM

## 2023-04-10 DIAGNOSIS — S46211D Strain of muscle, fascia and tendon of other parts of biceps, right arm, subsequent encounter: Secondary | ICD-10-CM | POA: Diagnosis not present

## 2023-04-10 DIAGNOSIS — M25521 Pain in right elbow: Secondary | ICD-10-CM

## 2023-04-10 DIAGNOSIS — M25621 Stiffness of right elbow, not elsewhere classified: Secondary | ICD-10-CM

## 2023-04-10 NOTE — Therapy (Signed)
OUTPATIENT PHYSICAL THERAPY    Patient Name: Eduardo Turner MRN: [id number] DOB:Oct 27, 1960, 62 y.o., male Today's Date: 04/10/2023  END OF SESSION:  PT End of Session - 04/10/23 1127     Visit Number 6    Number of Visits 24    Date for PT Re-Evaluation 06/15/23    Authorization Type [employer]BS    PT Start Time 1050    PT Stop Time 1124    PT Time Calculation (min) 34 min    Activity Tolerance Patient tolerated treatment well    Behavior During Therapy WFL for tasks assessed/performed                  Past Medical History:  Diagnosis Date   Allergy    Asthma    Dysplastic nevi    GERD (gastroesophageal reflux disease)    Hypertension    Multiple lipomas    Peptic esophageal ulcer    Stroke (HCC) 10/30/2019   Past Surgical History:  Procedure Laterality Date   COLONOSCOPY     KNEE SURGERY Left    SHOULDER SURGERY     bilateral   Patient Active Problem List   Diagnosis Date Noted   Fatty liver 10/31/2021   Acute medial meniscus tear, left, initial encounter 02/07/2021   History of adenomatous polyp of colon 03/19/2020   Dysarthria 01/03/2020   History of stroke 11/07/2019   Hyperlipidemia 07/21/2016   Ophthalmic migraine 09/20/2010   GERD 09/19/2009   Recurrent major depression in full remission (HCC) 06/21/2009   DYSPLASTIC NEVUS, BACK 02/05/2008   Lipoma 01/31/2008   ERECTILE DYSFUNCTION, MILD 03/17/2007   Essential hypertension 02/22/2007   Allergic rhinitis 02/22/2007   Asthma 02/22/2007    PCP: Dr Tana Conch  REFERRING PROVIDER: Dr Huel Cote   REFERRING DIAG:  Diagnosis  [phone] (ICD-10-CM) - Rupture of right distal biceps tendon, initial encounter    THERAPY DIAG:  Pain in right elbow  Stiffness of right elbow, not elsewhere classified  Localized edema  Rationale for Evaluation and Treatment: Rehabilitation  ONSET DATE: Sx on 03/19/2023  SUBJECTIVE:                                                                                                                                                                                       SUBJECTIVE STATEMENT: Pt states his other arm went numb when carrying a cooler, but it's feeling better now. His Rt hand still has swelling but is feeling better  Hand dominance: Right  PERTINENT HISTORY: Multiple Myloma, HTN, Stroke, Asthma, bilateral RTC   The patient was using a rope swing when he felt a pull in his arm.  He suffered a distal biceps strain. He had a repair on 03/19/2023.  At this time his pain is well-controlled.  He is currently in a sling.  He does have significant swelling in his hand.  PAIN:  Are you having pain? Yes: NPRS scale: 1/10 Pain location: aching  Pain description: aching  Aggravating factors: being active Relieving factors: rest   PRECAUTIONS:  protocol in folder: Do not be aggressive with extension or supination.   Begin passive range of motion of the shoulder.  Avoid elbow extension.  Begin strengthening per protocol.  Dr. Steward Drone may request strengthening earlier.     WEIGHT BEARING RESTRICTIONS: Yes NWB  FALLS:  Has patient fallen in last 6 months? Fell off rope swing  LIVING ENVIRONMENT: Nothing pertinent   OCCUPATION: International aid/development worker at [employer]   PLOF: Independent  PATIENT GOALS:   To return to full function  NEXT MD VISIT: 04/29/23  OBJECTIVE:       SENSATION: Mild numbness into the hand   POSTURE:  Edema: significant edema into the hand.   UPPER EXTREMITY ROM:   Passive ROM Right eval Left eval Right 8/27  Shoulder flexion     Shoulder extension     Shoulder abduction     Shoulder adduction     Shoulder internal rotation     Shoulder external rotation     Elbow flexion 90 degrees  121 degrees  Elbow extension 30 degrees without coming to end feel  25 degrees without coming to end feel  Wrist flexion Within normal limits    Wrist extension Within normal limits    Wrist ulnar deviation     Wrist radial  deviation     Wrist pronation     Wrist supination     (Blank rows = not tested)  UPPER EXTREMITY MMT:  MMT Right eval Left eval  Shoulder flexion    Shoulder extension    Shoulder abduction    Shoulder adduction    Shoulder internal rotation    Shoulder external rotation    Middle trapezius    Lower trapezius    Elbow flexion    Elbow extension    Wrist flexion    Wrist extension    Wrist ulnar deviation    Wrist radial deviation    Wrist pronation    Wrist supination    Grip strength (lbs)    (Blank rows = not tested) not tested secondary to recent surgery    PALPATION:  No unexpected tenderness to palpation   TODAY'S TREATMENT:                                                                                                                                         [facility] Adult PT Treatment:  DATE: 04/10/23 Therapeutic Exercise: Shoulder flexion finger ladder within elbow ROM limits x 7 Doorway ER/pec stretch within limites of protocol (no shoulder hyperextension) Wrist AROM flex/ext with elbow at 90 Wrist radial/ulnar deviation with elbow at 90 Elbow flexion ROM as tolerated Elbow pronation/supination ROM as tolerated with elbow supported at 90 degrees Scap squeeze seated and prone x 10 Manual Therapy: STM wrist extensors PROM supination to tolerance Scar massage Wrist mobs grade 2-3 to tolerance   [facility] Adult PT Treatment:                                                DATE: 04/07/23 Therapeutic Exercise: Shoulder flexion finger ladder within elbow ROM limits x 7 Doorway ER/pec stretch within limites of protocol (no shoulder hyperextension) Wrist AROM flex/ext with elbow at 90 Wrist radial/ulnar deviation with elbow at 90 Elbow flexion ROM as tolerated Elbow pronation/supination ROM as tolerated with elbow supported at 90 degrees Scap squeeze Manual Therapy: Scar massage Shoulder PROM STM Rt pecs   [facility] Adult  PT Treatment:                                                DATE: 04/03/23 Therapeutic Exercise: Shoulder flexion finger ladder within elbow ROM limits x 5 Doorway ER/pec stretch within limites of protocol (no shoulder hyperextension) Wrist AROM flex/ext with elbow at 90 Wrist radial/ulnar deviation with elbow at 90 Elbow flexion ROM as tolerated Elbow pronation/supination ROM as tolerated with elbow supported at 90 degrees Scap squeeze Manual Therapy: Shoulder PROM STM Rt pecs    PATIENT EDUCATION: Education details: HEP, symptom management Person educated: Patient Education method: Explanation, Demonstration, Tactile cues, Verbal cues, and Handouts Education comprehension: verbalized understanding, returned demonstration, verbal cues required, tactile cues required, and needs further education  HOME EXERCISE PROGRAM: Access Code: [email or web address] URL: [email or web address] Date: 03/31/2023 Prepared by: Reggy Eye  Exercises - Putty Squeezes  - 1 x daily - 7 x weekly - 3 sets - 10 reps - Seated Scapular Retraction  - 1 x daily - 7 x weekly - 3 sets - 10 reps - Wrist AROM Flexion Extension  - 1 x daily - 7 x weekly - 3 sets - 10 reps - Wrist Radial Ulnar Deviation AROM  - 1 x daily - 7 x weekly - 3 sets - 10 reps - Seated Forearm Pronation and Supination AROM  - 1 x daily - 7 x weekly - 3 sets - 10 reps - Single Arm Doorway Pec Stretch at 60 Elevation  - 1 x daily - 7 x weekly - 1 sets - 10 reps - 10 seconds hold  ASSESSMENT:  CLINICAL IMPRESSION: Pt continues to progress well within limits of protocol. Some discomfort with PROM into supination, adjusted to pt tolerance. Frequency decreased to 1x a week until MD appointment 04/29/23   OBJECTIVE IMPAIRMENTS: decreased ROM, decreased strength, increased edema, impaired UE functional use, and pain.   GOALS: Goals reviewed with patient? Yes  SHORT TERM GOALS: Target date:  05/04/2023    Patient will demonstrate  full elbow extension on the right side Baseline: Goal status: INITIAL  2.  Patient will demonstrate full elbow flexion on the right side Baseline:  Goal status: INITIAL  3.  Patient will be independent with basic HEP to begin strengthening program for work Baseline:  Goal status: INITIAL  4.  Patient will show no signs of edema in his right hand Baseline:  Goal status: INITIAL   LONG TERM GOALS: Target date: 06/15/2023    Patient will lift 10 to 15 pound box without increased biceps pain when cleared by MD in order to return to work Baseline:  Goal status: INITIAL  2.  Patient will demonstrate full right elbow and shoulder strength in order to perform ADLs Baseline:  Goal status: INITIAL  3.  Patient will perform all hygiene and daily functional tasks with right upper extremity without increased pain Baseline:  Goal status: INITIAL  PLAN: PT FREQUENCY: 1-2x/week  PT DURATION: 12 weeks  PLANNED INTERVENTIONS:  Therapeutic exercises, Therapeutic activity, Neuromuscular re-education,  Patient/Family education, Self Care, Joint mobilization, Stair training, DME instructions, Aquatic Therapy, Dry Needling, Electrical stimulation, Cryotherapy, Moist heat, Taping, Manual therapy, and Re-evaluation.   PLAN FOR NEXT SESSION:  AROM elbow, wrist, shoulder per protocol  Amoree Newlon, PT 04/10/2023, 11:28 AM

## 2023-04-14 ENCOUNTER — Encounter (HOSPITAL_BASED_OUTPATIENT_CLINIC_OR_DEPARTMENT_OTHER): Payer: BC Managed Care – PPO

## 2023-04-15 ENCOUNTER — Ambulatory Visit: Payer: BC Managed Care – PPO | Attending: Orthopaedic Surgery | Admitting: Physical Therapy

## 2023-04-15 ENCOUNTER — Encounter: Payer: Self-pay | Admitting: Physical Therapy

## 2023-04-15 DIAGNOSIS — M25521 Pain in right elbow: Secondary | ICD-10-CM | POA: Insufficient documentation

## 2023-04-15 DIAGNOSIS — M25621 Stiffness of right elbow, not elsewhere classified: Secondary | ICD-10-CM | POA: Diagnosis not present

## 2023-04-15 DIAGNOSIS — R600 Localized edema: Secondary | ICD-10-CM | POA: Diagnosis not present

## 2023-04-15 NOTE — Therapy (Signed)
OUTPATIENT PHYSICAL THERAPY    Patient Name: Eduardo Turner MRN: [id number] DOB:[birth year], 61 y.o., male Today's Date: 04/15/2023  END OF SESSION:  PT End of Session - 04/15/23 1214     Visit Number 7    Number of Visits 24    Date for PT Re-Evaluation 06/15/23    Authorization Type BCBS    PT Start Time 1145    PT Stop Time 1215    PT Time Calculation (min) 30 min    Activity Tolerance Patient tolerated treatment well    Behavior During Therapy [facility] for tasks assessed/performed                   Past Medical History:  Diagnosis Date   Allergy    Asthma    Dysplastic nevi    GERD (gastroesophageal reflux disease)    Hypertension    Multiple lipomas    Peptic esophageal ulcer    Stroke (HCC) 10/30/2019   Past Surgical History:  Procedure Laterality Date   COLONOSCOPY     KNEE SURGERY Left    SHOULDER SURGERY     bilateral   Patient Active Problem List   Diagnosis Date Noted   Fatty liver 10/31/2021   Acute medial meniscus tear, left, initial encounter 02/07/2021   History of adenomatous polyp of colon 03/19/2020   Dysarthria 01/03/2020   History of stroke 11/07/2019   Hyperlipidemia 07/21/2016   Ophthalmic migraine 09/20/2010   GERD 09/19/2009   Recurrent major depression in full remission (HCC) 06/21/2009   DYSPLASTIC NEVUS, BACK 02/05/2008   Lipoma 01/31/2008   ERECTILE DYSFUNCTION, MILD 03/17/2007   Essential hypertension 02/22/2007   Allergic rhinitis 02/22/2007   Asthma 02/22/2007    PCP: Dr Tana Conch  REFERRING PROVIDER: Dr Huel Cote   REFERRING DIAG:  Diagnosis  [phone] (ICD-10-CM) - Rupture of right distal biceps tendon, initial encounter    THERAPY DIAG:  Pain in right elbow  Stiffness of right elbow, not elsewhere classified  Localized edema  Rationale for Evaluation and Treatment: Rehabilitation  ONSET DATE: Sx on 03/19/2023  SUBJECTIVE:                                                                                                                                                                                       SUBJECTIVE STATEMENT: Pt states he is feeling "good". He states his Rt shoulder feels "tight"  Hand dominance: Right  PERTINENT HISTORY: Multiple Myloma, HTN, Stroke, Asthma, bilateral RTC   The patient was using a rope swing when he felt a pull in his arm. He suffered a distal biceps strain. He had a repair on 03/19/2023.  At this time his pain is well-controlled.  He is currently in a sling.  He does have significant swelling in his hand.  PAIN:  Are you having pain? Yes: NPRS scale: 1/10 Pain location: aching  Pain description: aching  Aggravating factors: being active Relieving factors: rest   PRECAUTIONS:  protocol in folder: Do not be aggressive with extension or supination.   Begin passive range of motion of the shoulder.  Avoid elbow extension.  Begin strengthening per protocol.  Dr. Steward Drone may request strengthening earlier.     WEIGHT BEARING RESTRICTIONS: Yes NWB  FALLS:  Has patient fallen in last 6 months? Fell off rope swing  LIVING ENVIRONMENT: Nothing pertinent   OCCUPATION: International aid/development worker at [employer]   PLOF: Independent  PATIENT GOALS:   To return to full function  NEXT MD VISIT: 04/29/23  OBJECTIVE:       SENSATION: Mild numbness into the hand   POSTURE:  Edema: significant edema into the hand.   UPPER EXTREMITY ROM:   Passive ROM Right eval Left eval Right 8/27  Shoulder flexion     Shoulder extension     Shoulder abduction     Shoulder adduction     Shoulder internal rotation     Shoulder external rotation     Elbow flexion 90 degrees  121 degrees  Elbow extension 30 degrees without coming to end feel  25 degrees without coming to end feel  Wrist flexion Within normal limits    Wrist extension Within normal limits    Wrist ulnar deviation     Wrist radial deviation     Wrist pronation     Wrist supination     (Blank rows =  not tested)  UPPER EXTREMITY MMT:  MMT Right eval Left eval  Shoulder flexion    Shoulder extension    Shoulder abduction    Shoulder adduction    Shoulder internal rotation    Shoulder external rotation    Middle trapezius    Lower trapezius    Elbow flexion    Elbow extension    Wrist flexion    Wrist extension    Wrist ulnar deviation    Wrist radial deviation    Wrist pronation    Wrist supination    Grip strength (lbs)    (Blank rows = not tested) not tested secondary to recent surgery    PALPATION:  No unexpected tenderness to palpation   TODAY'S TREATMENT:                                                                                                                                         [facility] Adult PT Treatment:  DATE: 04/15/23 Therapeutic Exercise: Shoulder flexion finger ladder within elbow ROM limits x 7 Doorway ER/pec stretch within limites of protocol (no shoulder hyperextension) Scap squeeze standing and seated Wrist AROM flex/ext with elbow at 90 Wrist radial/ulnar deviation with elbow at 90 Elbow flexion ROM as tolerated Elbow pronation/supination ROM as tolerated with elbow supported at 90 degrees Manual Therapy: STM wrist extensors PROM supination to tolerance Shoulder PROM   [facility] Adult PT Treatment:                                                DATE: 04/10/23 Therapeutic Exercise: Shoulder flexion finger ladder within elbow ROM limits x 7 Doorway ER/pec stretch within limites of protocol (no shoulder hyperextension) Wrist AROM flex/ext with elbow at 90 Wrist radial/ulnar deviation with elbow at 90 Elbow flexion ROM as tolerated Elbow pronation/supination ROM as tolerated with elbow supported at 90 degrees Scap squeeze seated and prone x 10 Manual Therapy: STM wrist extensors PROM supination to tolerance Scar massage Wrist mobs grade 2-3 to tolerance   [facility] Adult PT Treatment:                                                 DATE: 04/07/23 Therapeutic Exercise: Shoulder flexion finger ladder within elbow ROM limits x 7 Doorway ER/pec stretch within limites of protocol (no shoulder hyperextension) Wrist AROM flex/ext with elbow at 90 Wrist radial/ulnar deviation with elbow at 90 Elbow flexion ROM as tolerated Elbow pronation/supination ROM as tolerated with elbow supported at 90 degrees Scap squeeze Manual Therapy: Scar massage Shoulder PROM STM Rt pecs   PATIENT EDUCATION: Education details: HEP, symptom management Person educated: Patient Education method: Explanation, Demonstration, Tactile cues, Verbal cues, and Handouts Education comprehension: verbalized understanding, returned demonstration, verbal cues required, tactile cues required, and needs further education  HOME EXERCISE PROGRAM: Access Code: [email or web address] URL: [email or web address] Date: 03/31/2023 Prepared by: Reggy Eye  Exercises - Putty Squeezes  - 1 x daily - 7 x weekly - 3 sets - 10 reps - Seated Scapular Retraction  - 1 x daily - 7 x weekly - 3 sets - 10 reps - Wrist AROM Flexion Extension  - 1 x daily - 7 x weekly - 3 sets - 10 reps - Wrist Radial Ulnar Deviation AROM  - 1 x daily - 7 x weekly - 3 sets - 10 reps - Seated Forearm Pronation and Supination AROM  - 1 x daily - 7 x weekly - 3 sets - 10 reps - Single Arm Doorway Pec Stretch at 60 Elevation  - 1 x daily - 7 x weekly - 1 sets - 10 reps - 10 seconds hold  ASSESSMENT:  CLINICAL IMPRESSION: Pt continues to progress well through protocol. Improved scar mobility noted today, decreased tightness with supination   OBJECTIVE IMPAIRMENTS: decreased ROM, decreased strength, increased edema, impaired UE functional use, and pain.   GOALS: Goals reviewed with patient? Yes  SHORT TERM GOALS: Target date:  05/04/2023    Patient will demonstrate full elbow extension on the right side Baseline: Goal status: INITIAL  2.   Patient will demonstrate full elbow flexion on the right side Baseline:  Goal status: INITIAL  3.  Patient will be independent with  basic HEP to begin strengthening program for work Baseline:  Goal status: INITIAL  4.  Patient will show no signs of edema in his right hand Baseline:  Goal status: INITIAL   LONG TERM GOALS: Target date: 06/15/2023    Patient will lift 10 to 15 pound box without increased biceps pain when cleared by MD in order to return to work Baseline:  Goal status: INITIAL  2.  Patient will demonstrate full right elbow and shoulder strength in order to perform ADLs Baseline:  Goal status: INITIAL  3.  Patient will perform all hygiene and daily functional tasks with right upper extremity without increased pain Baseline:  Goal status: INITIAL  PLAN: PT FREQUENCY: 1-2x/week  PT DURATION: 12 weeks  PLANNED INTERVENTIONS:  Therapeutic exercises, Therapeutic activity, Neuromuscular re-education,  Patient/Family education, Self Care, Joint mobilization, Stair training, DME instructions, Aquatic Therapy, Dry Needling, Electrical stimulation, Cryotherapy, Moist heat, Taping, Manual therapy, and Re-evaluation.   PLAN FOR NEXT SESSION:  AROM elbow, wrist, shoulder per protocol  [employer], PT 04/15/2023, 12:15 PM

## 2023-04-21 ENCOUNTER — Encounter (HOSPITAL_BASED_OUTPATIENT_CLINIC_OR_DEPARTMENT_OTHER): Payer: BC Managed Care – PPO | Admitting: Physical Therapy

## 2023-04-22 ENCOUNTER — Encounter: Payer: Self-pay | Admitting: Physical Therapy

## 2023-04-22 ENCOUNTER — Ambulatory Visit: Payer: BC Managed Care – PPO | Admitting: Physical Therapy

## 2023-04-22 DIAGNOSIS — M25521 Pain in right elbow: Secondary | ICD-10-CM

## 2023-04-22 DIAGNOSIS — R600 Localized edema: Secondary | ICD-10-CM | POA: Diagnosis not present

## 2023-04-22 DIAGNOSIS — M25621 Stiffness of right elbow, not elsewhere classified: Secondary | ICD-10-CM

## 2023-04-22 NOTE — Therapy (Signed)
OUTPATIENT PHYSICAL THERAPY    Patient Name: Eduardo Turner MRN: [id number] DOB:1960-12-27, 62 y.o., male Today's Date: 04/22/2023  END OF SESSION:  PT End of Session - 04/22/23 1126     Visit Number 8    Number of Visits 24    Date for PT Re-Evaluation 06/15/23    Authorization Type BCBS    PT Start Time 1055    PT Stop Time 1127    PT Time Calculation (min) 32 min    Activity Tolerance Patient tolerated treatment well    Behavior During Therapy WFL for tasks assessed/performed                    Past Medical History:  Diagnosis Date   Allergy    Asthma    Dysplastic nevi    GERD (gastroesophageal reflux disease)    Hypertension    Multiple lipomas    Peptic esophageal ulcer    Stroke (HCC) 10/30/2019   Past Surgical History:  Procedure Laterality Date   COLONOSCOPY     KNEE SURGERY Left    SHOULDER SURGERY     bilateral   Patient Active Problem List   Diagnosis Date Noted   Fatty liver 10/31/2021   Acute medial meniscus tear, left, initial encounter 02/07/2021   History of adenomatous polyp of colon 03/19/2020   Dysarthria 01/03/2020   History of stroke 11/07/2019   Hyperlipidemia 07/21/2016   Ophthalmic migraine 09/20/2010   GERD 09/19/2009   Recurrent major depression in full remission (HCC) 06/21/2009   DYSPLASTIC NEVUS, BACK 02/05/2008   Lipoma 01/31/2008   ERECTILE DYSFUNCTION, MILD 03/17/2007   Essential hypertension 02/22/2007   Allergic rhinitis 02/22/2007   Asthma 02/22/2007    PCP: Dr Tana Conch  REFERRING PROVIDER: Dr Huel Cote   REFERRING DIAG:  Diagnosis  [phone] (ICD-10-CM) - Rupture of right distal biceps tendon, initial encounter    THERAPY DIAG:  Pain in right elbow  Stiffness of right elbow, not elsewhere classified  Localized edema  Rationale for Evaluation and Treatment: Rehabilitation  ONSET DATE: Sx on 03/19/2023  SUBJECTIVE:                                                                                                                                                                                       SUBJECTIVE STATEMENT: 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 He states he was a little sore last night but feels great today  Hand dominance: Right  PERTINENT HISTORY: Multiple Myloma, HTN, Stroke, Asthma, bilateral RTC   The patient was using a rope swing when he felt a  pull in his arm. He suffered a distal biceps strain. He had a repair on 03/19/2023.  At this time his pain is well-controlled.  He is currently in a sling.  He does have significant swelling in his hand.  PAIN:  Are you having pain? Yes: NPRS scale: 1/10 Pain location: aching  Pain description: aching  Aggravating factors: being active Relieving factors: rest   PRECAUTIONS:  protocol in folder: Do not be aggressive with extension or supination.   Begin passive range of motion of the shoulder.  Avoid elbow extension.  Begin strengthening per protocol.  Dr. Steward Drone may request strengthening earlier.     WEIGHT BEARING RESTRICTIONS: Yes NWB  FALLS:  Has patient fallen in last 6 months? Fell off rope swing  LIVING ENVIRONMENT: Nothing pertinent   OCCUPATION: International aid/development worker at [employer]   PLOF: Independent  PATIENT GOALS:   To return to full function  NEXT MD VISIT: 04/29/23  OBJECTIVE:       SENSATION: Mild numbness into the hand   POSTURE:  Edema: significant edema into the hand.   UPPER EXTREMITY ROM:   Passive ROM Right eval Left eval Right 8/27 Right 04/22/23  Shoulder flexion      Shoulder extension      Shoulder abduction      Shoulder adduction      Shoulder internal rotation      Shoulder external rotation      Elbow flexion 90 degrees  121 degrees 135  Elbow extension 30 degrees without coming to end feel  25 degrees without coming to end feel 4 degrees before end feel  Wrist flexion Within normal limits     Wrist extension  Within normal limits     Wrist ulnar deviation      Wrist radial deviation      Wrist pronation      Wrist supination      (Blank rows = not tested)  UPPER EXTREMITY MMT:  MMT Right eval Left eval  Shoulder flexion    Shoulder extension    Shoulder abduction    Shoulder adduction    Shoulder internal rotation    Shoulder external rotation    Middle trapezius    Lower trapezius    Elbow flexion    Elbow extension    Wrist flexion    Wrist extension    Wrist ulnar deviation    Wrist radial deviation    Wrist pronation    Wrist supination    Grip strength (lbs)    (Blank rows = not tested) not tested secondary to recent surgery    PALPATION:  No unexpected tenderness to palpation   TODAY'S TREATMENT:                                                                                                                                         [facility] Adult PT Treatment:  DATE: 04/22/23 Therapeutic Exercise: Shoulder flexion finger ladder within elbow ROM limits x 10 Doorway ER/pec stretch within limites of protocol (no shoulder hyperextension) Prone T, I all 2 x 10 (no shoulder hyperextension) Wrist AROM flex/ext with elbow at 90 Wrist radial/ulnar deviation with elbow at 90 Elbow pronation/supination ROM as tolerated with elbow supported at 90 degrees Manual Therapy: Wrist PROM as tolerated   [facility] Adult PT Treatment:                                                DATE: 04/15/23 Therapeutic Exercise: Shoulder flexion finger ladder within elbow ROM limits x 7 Doorway ER/pec stretch within limites of protocol (no shoulder hyperextension) Scap squeeze standing and seated Wrist AROM flex/ext with elbow at 90 Wrist radial/ulnar deviation with elbow at 90 Elbow flexion ROM as tolerated Elbow pronation/supination ROM as tolerated with elbow supported at 90 degrees Manual Therapy: STM wrist extensors PROM supination to  tolerance Shoulder PROM   [facility] Adult PT Treatment:                                                DATE: 04/10/23 Therapeutic Exercise: Shoulder flexion finger ladder within elbow ROM limits x 7 Doorway ER/pec stretch within limites of protocol (no shoulder hyperextension) Wrist AROM flex/ext with elbow at 90 Wrist radial/ulnar deviation with elbow at 90 Elbow flexion ROM as tolerated Elbow pronation/supination ROM as tolerated with elbow supported at 90 degrees Scap squeeze seated and prone x 10 Manual Therapy: STM wrist extensors PROM supination to tolerance Scar massage Wrist mobs grade 2-3 to tolerance   PATIENT EDUCATION: Education details: HEP, symptom management Person educated: Patient Education method: Explanation, Demonstration, Tactile cues, Verbal cues, and Handouts Education comprehension: verbalized understanding, returned demonstration, verbal cues required, tactile cues required, and needs further education  HOME EXERCISE PROGRAM: Access Code: [email or web address] URL: [email or web address] Date: 03/31/2023 Prepared by: Reggy Eye  Exercises - Putty Squeezes  - 1 x daily - 7 x weekly - 3 sets - 10 reps - Seated Scapular Retraction  - 1 x daily - 7 x weekly - 3 sets - 10 reps - Wrist AROM Flexion Extension  - 1 x daily - 7 x weekly - 3 sets - 10 reps - Wrist Radial Ulnar Deviation AROM  - 1 x daily - 7 x weekly - 3 sets - 10 reps - Seated Forearm Pronation and Supination AROM  - 1 x daily - 7 x weekly - 3 sets - 10 reps - Single Arm Doorway Pec Stretch at 60 Elevation  - 1 x daily - 7 x weekly - 1 sets - 10 reps - 10 seconds hold  ASSESSMENT:  CLINICAL IMPRESSION: Pt has improved elbow ROM and decreased edema. He has minimal to no pain and is progressing well through protocol. Pt sees MD next week for instructions on how to proceed with strengthening.   OBJECTIVE IMPAIRMENTS: decreased ROM, decreased strength, increased edema, impaired UE functional  use, and pain.   GOALS: Goals reviewed with patient? Yes  SHORT TERM GOALS: Target date:  05/04/2023    Patient will demonstrate full elbow extension on the right side Baseline: Goal status: INITIAL  2.  Patient will demonstrate full elbow flexion  on the right side Baseline:  Goal status: MET  3.  Patient will be independent with basic HEP to begin strengthening program for work Baseline:  Goal status: INITIAL  4.  Patient will show no signs of edema in his right hand Baseline:  Goal status: MET   LONG TERM GOALS: Target date: 06/15/2023    Patient will lift 10 to 15 pound box without increased biceps pain when cleared by MD in order to return to work Baseline:  Goal status: INITIAL  2.  Patient will demonstrate full right elbow and shoulder strength in order to perform ADLs Baseline:  Goal status: INITIAL  3.  Patient will perform all hygiene and daily functional tasks with right upper extremity without increased pain Baseline:  Goal status: INITIAL  PLAN: PT FREQUENCY: 1-2x/week  PT DURATION: 12 weeks  PLANNED INTERVENTIONS:  Therapeutic exercises, Therapeutic activity, Neuromuscular re-education,  Patient/Family education, Self Care, Joint mobilization, Stair training, DME instructions, Aquatic Therapy, Dry Needling, Electrical stimulation, Cryotherapy, Moist heat, Taping, Manual therapy, and Re-evaluation.   PLAN FOR NEXT SESSION:  AROM elbow, wrist, shoulder per protocol  Grabiela Wohlford, PT 04/22/2023, 11:27 AM

## 2023-04-24 ENCOUNTER — Encounter (HOSPITAL_BASED_OUTPATIENT_CLINIC_OR_DEPARTMENT_OTHER): Payer: BC Managed Care – PPO | Admitting: Physical Therapy

## 2023-04-28 ENCOUNTER — Encounter (HOSPITAL_BASED_OUTPATIENT_CLINIC_OR_DEPARTMENT_OTHER): Payer: BC Managed Care – PPO | Admitting: Physical Therapy

## 2023-04-29 ENCOUNTER — Ambulatory Visit (INDEPENDENT_AMBULATORY_CARE_PROVIDER_SITE_OTHER): Payer: BC Managed Care – PPO | Admitting: Orthopaedic Surgery

## 2023-04-29 DIAGNOSIS — S46211A Strain of muscle, fascia and tendon of other parts of biceps, right arm, initial encounter: Secondary | ICD-10-CM

## 2023-04-29 NOTE — Progress Notes (Signed)
Post Operative Evaluation    Procedure/Date of Surgery: Right distal biceps tendon repair 8/8  Interval History:   6 weeks status post distal biceps tendon repair.  Doing extremely well at today's visit.  He has been working with physical therapy.  Range of motion essentially normalized.  Tendon remains intact.  Little to no pain.  Numbness about the dorsal aspect of the right hand dramatically improved   PMH/PSH/Family History/Social History/Meds/Allergies:    Past Medical History:  Diagnosis Date   Allergy    Asthma    Dysplastic nevi    GERD (gastroesophageal reflux disease)    Hypertension    Multiple lipomas    Peptic esophageal ulcer    Stroke (HCC) 10/30/2019   Past Surgical History:  Procedure Laterality Date   COLONOSCOPY     KNEE SURGERY Left    SHOULDER SURGERY     bilateral   Social History   Socioeconomic History   Marital status: Married    Spouse name: Not on file   Number of children: Not on file   Years of education: Not on file   Highest education level: Not on file  Occupational History   Not on file  Tobacco Use   Smoking status: Never   Smokeless tobacco: Never  Vaping Use   Vaping status: Never Used  Substance and Sexual Activity   Alcohol use: Yes    Alcohol/week: 0.0 standard drinks of alcohol    Comment: occ   Drug use: No   Sexual activity: Not on file  Other Topics Concern   Not on file  Social History Narrative   Married. 2 children both out of house. 4 grandkids total (1 in [place] with son, Maurice March- son of Maddy). Granddaughter 69 and grandson 5 in 05/2022- keeping them full time      International aid/development worker at [employer].    Social Determinants of Health   Financial Resource Strain: Low Risk  (10/24/2021)   Received from [facility], [facility]   Overall Financial Resource Strain (CARDIA)    Difficulty of Paying Living Expenses: Not hard at all  Food Insecurity: Not on file  Transportation  Needs: Not on file  Physical Activity: Not on file  Stress: No Stress Concern Present (10/24/2021)   Received from [facility], [facility]   [employer] of Occupational Health - Occupational Stress Questionnaire    Feeling of Stress : Not at all  Social Connections: Unknown (12/10/2021)   Received from [facility], [facility]   Social Network    Social Network: Not on file   Family History  Problem Relation Age of Onset   Other Mother        routine surgery lead to sepsis- hysterectomy   Heart attack Father        age 62   Bipolar disorder Sister    Colon cancer Neg Hx    Esophageal cancer Neg Hx    Rectal cancer Neg Hx    Stomach cancer Neg Hx    Allergies  Allergen Reactions   Erythromycin Ethylsuccinate     REACTION: HIVES   Current Outpatient Medications  Medication Sig Dispense Refill   albuterol (VENTOLIN HFA) 108 (90 Base) MCG/ACT inhaler Inhale 1-2 puffs into the lungs every 6 (six) hours as needed for wheezing or shortness  of breath. 6.7 g 0   aspirin EC 325 MG tablet Take 1 tablet (325 mg total) by mouth daily. 14 tablet 0   aspirin EC 81 MG tablet Take 81 mg by mouth daily. Swallow whole.     bacitracin-polymyxin b (POLYSPORIN) ophthalmic ointment Place 1 Application into the right eye every 12 (twelve) hours. apply to eye every 12 hours while awake 3.5 g 0   citalopram (CELEXA) 20 MG tablet TAKE 1 TABLET(20 MG) BY MOUTH DAILY 90 tablet 3   cyclobenzaprine (FLEXERIL) 10 MG tablet Take 1 tablet (10 mg total) by mouth 3 (three) times daily as needed for muscle spasms (do not drive for 8 hours afte use). (Patient not taking: Reported on 12/04/2022) 30 tablet 0   lisinopril (ZESTRIL) 40 MG tablet TAKE 1 TABLET BY MOUTH DAILY 90 tablet 3   loratadine (CLARITIN) 10 MG tablet Take 10 mg by mouth daily.     omeprazole (PRILOSEC) 20 MG capsule Take 1 capsule (20 mg total) by mouth daily. 90 capsule 3   oxyCODONE (ROXICODONE) 5 MG immediate release tablet Take 1  tablet (5 mg total) by mouth every 4 (four) hours as needed for severe pain or breakthrough pain. 5 tablet 0   rosuvastatin (CRESTOR) 40 MG tablet TAKE 1 TABLET(40 MG) BY MOUTH DAILY 90 tablet 3   sildenafil (VIAGRA) 100 MG tablet Take 1 tablet (100 mg total) by mouth daily as needed for erectile dysfunction. 10 tablet 11   No current facility-administered medications for this visit.   No results found.  Review of Systems:   A ROS was performed including pertinent positives and negatives as documented in the HPI.   Musculoskeletal Exam:    There were no vitals taken for this visit.  Right elbow incision is well-healed.  Negative hook test.  Range of motion is from 2 degrees to 130 without pain.  Full supination without pain  Imaging:      I personally reviewed and interpreted the radiographs.   Assessment:   6 weeks status post right distal tendon biceps repair.  Overall doing extremely well.  This time he may gradually progress active range of motion with strengthening with very small weights and ramping up per protocol.  I will plan to see him back in 6 weeks for reassessment  Plan :    -Return to clinic in 6 weeks for reassessment      I personally saw and evaluated the patient, and participated in the management and treatment plan.  Huel Cote, MD Attending Physician, Orthopedic Surgery  This document was dictated using Dragon voice recognition software. A reasonable attempt at proof reading has been made to minimize errors.

## 2023-05-01 ENCOUNTER — Ambulatory Visit: Payer: BC Managed Care – PPO | Admitting: Physical Therapy

## 2023-05-01 ENCOUNTER — Encounter (HOSPITAL_BASED_OUTPATIENT_CLINIC_OR_DEPARTMENT_OTHER): Payer: BC Managed Care – PPO | Admitting: Physical Therapy

## 2023-05-01 ENCOUNTER — Encounter: Payer: Self-pay | Admitting: Physical Therapy

## 2023-05-01 DIAGNOSIS — M25621 Stiffness of right elbow, not elsewhere classified: Secondary | ICD-10-CM | POA: Diagnosis not present

## 2023-05-01 DIAGNOSIS — M25521 Pain in right elbow: Secondary | ICD-10-CM | POA: Diagnosis not present

## 2023-05-01 DIAGNOSIS — R600 Localized edema: Secondary | ICD-10-CM

## 2023-05-01 NOTE — Therapy (Signed)
OUTPATIENT PHYSICAL THERAPY    Patient Name: Eduardo Turner MRN: [id number] DOB:11-18-1960, 62 y.o., male Today's Date: 05/01/2023  END OF SESSION:  PT End of Session - 05/01/23 1052     Visit Number 9    Number of Visits 24    Date for PT Re-Evaluation 06/15/23    PT Start Time 1013    PT Stop Time 1052    PT Time Calculation (min) 39 min    Activity Tolerance Patient tolerated treatment well    Behavior During Therapy [facility] for tasks assessed/performed                     Past Medical History:  Diagnosis Date   Allergy    Asthma    Dysplastic nevi    GERD (gastroesophageal reflux disease)    Hypertension    Multiple lipomas    Peptic esophageal ulcer    Stroke (HCC) 10/30/2019   Past Surgical History:  Procedure Laterality Date   COLONOSCOPY     KNEE SURGERY Left    SHOULDER SURGERY     bilateral   Patient Active Problem List   Diagnosis Date Noted   Fatty liver 10/31/2021   Acute medial meniscus tear, left, initial encounter 02/07/2021   History of adenomatous polyp of colon 03/19/2020   Dysarthria 01/03/2020   History of stroke 11/07/2019   Hyperlipidemia 07/21/2016   Ophthalmic migraine 09/20/2010   GERD 09/19/2009   Recurrent major depression in full remission (HCC) 06/21/2009   DYSPLASTIC NEVUS, BACK 02/05/2008   Lipoma 01/31/2008   ERECTILE DYSFUNCTION, MILD 03/17/2007   Essential hypertension 02/22/2007   Allergic rhinitis 02/22/2007   Asthma 02/22/2007    PCP: Dr Tana Conch  REFERRING PROVIDER: Dr Huel Cote   REFERRING DIAG:  Diagnosis  [phone] (ICD-10-CM) - Rupture of right distal biceps tendon, initial encounter    THERAPY DIAG:  Pain in right elbow  Stiffness of right elbow, not elsewhere classified  Localized edema  Rationale for Evaluation and Treatment: Rehabilitation  ONSET DATE: Sx on 03/19/2023  SUBJECTIVE:                                                                                                                                                                                       SUBJECTIVE STATEMENT: Pt states he saw MD who is pleased with progress. Per MD pt is ok to begin strengthening with "small weights"  Hand dominance: Right  PERTINENT HISTORY: Multiple Myloma, HTN, Stroke, Asthma, bilateral RTC   The patient was using a rope swing when he felt a pull in his arm. He suffered a distal biceps strain. He had  a repair on 03/19/2023.  At this time his pain is well-controlled.  He is currently in a sling.  He does have significant swelling in his hand.  PAIN:  Are you having pain? Yes: NPRS scale: 1/10 Pain location: aching  Pain description: aching  Aggravating factors: being active Relieving factors: rest   PRECAUTIONS: Per MD visit 9/18: OK to begin with "small weights and progress per protocol"    WEIGHT BEARING RESTRICTIONS: Yes WBAT  FALLS:  Has patient fallen in last 6 months? Fell off rope swing  LIVING ENVIRONMENT: Nothing pertinent   OCCUPATION: International aid/development worker at [employer]   PLOF: Independent  PATIENT GOALS:   To return to full function  NEXT MD VISIT: 04/29/23  OBJECTIVE:       SENSATION: Mild numbness into the hand   POSTURE:  Edema: significant edema into the hand.   UPPER EXTREMITY ROM:   Passive ROM Right eval Left eval Right 8/27 Right 04/22/23 Right 9/20  Shoulder flexion       Shoulder extension       Shoulder abduction       Shoulder adduction       Shoulder internal rotation       Shoulder external rotation       Elbow flexion 90 degrees  121 degrees 135   Elbow extension 30 degrees without coming to end feel  25 degrees without coming to end feel 4 degrees before end feel 0 degrees  Wrist flexion Within normal limits      Wrist extension Within normal limits      Wrist ulnar deviation       Wrist radial deviation       Wrist pronation       Wrist supination       (Blank rows = not tested)  UPPER EXTREMITY MMT:  MMT  Right eval Left eval  Shoulder flexion    Shoulder extension    Shoulder abduction    Shoulder adduction    Shoulder internal rotation    Shoulder external rotation    Middle trapezius    Lower trapezius    Elbow flexion    Elbow extension    Wrist flexion    Wrist extension    Wrist ulnar deviation    Wrist radial deviation    Wrist pronation    Wrist supination    Grip strength (lbs)    (Blank rows = not tested) not tested secondary to recent surgery    PALPATION:  No unexpected tenderness to palpation   TODAY'S TREATMENT:                                                                                                                                         [facility] Adult PT Treatment:  DATE: 05/01/23 Therapeutic Exercise: Prone I, T, Y x 15 Submax isometrics at 90 degrees elbow flexion - flexion and extension 10 x 3 sec Bent elbow wall plank 2 x 30 seconds Rows green TB x 15 Shoulder ext green TB x 15 Bilat shoulder ER green TB x 15 Wrist flexion 2# x 30 Wrist ext 2# x 30 Radial deviation 2# x 30 Wrist supination/pronation 2# x 30 Rhythmic stabilization Rt shoulder in supine 3 x 30 sec   [facility] Adult PT Treatment:                                                DATE: 04/22/23 Therapeutic Exercise: Shoulder flexion finger ladder within elbow ROM limits x 10 Doorway ER/pec stretch within limites of protocol (no shoulder hyperextension) Prone T, I all 2 x 10 (no shoulder hyperextension) Wrist AROM flex/ext with elbow at 90 Wrist radial/ulnar deviation with elbow at 90 Elbow pronation/supination ROM as tolerated with elbow supported at 90 degrees Manual Therapy: Wrist PROM as tolerated   [facility] Adult PT Treatment:                                                DATE: 04/15/23 Therapeutic Exercise: Shoulder flexion finger ladder within elbow ROM limits x 7 Doorway ER/pec stretch within limites of protocol (no shoulder  hyperextension) Scap squeeze standing and seated Wrist AROM flex/ext with elbow at 90 Wrist radial/ulnar deviation with elbow at 90 Elbow flexion ROM as tolerated Elbow pronation/supination ROM as tolerated with elbow supported at 90 degrees Manual Therapy: STM wrist extensors PROM supination to tolerance Shoulder PROM   PATIENT EDUCATION: Education details: HEP, symptom management Person educated: Patient Education method: Explanation, Demonstration, Tactile cues, Verbal cues, and Handouts Education comprehension: verbalized understanding, returned demonstration, verbal cues required, tactile cues required, and needs further education  HOME EXERCISE PROGRAM: Access Code: [email or web address] URL: [email or web address] Date: 05/01/2023 Prepared by: Reggy Eye  Exercises - Putty Squeezes  - 1 x daily - 7 x weekly - 3 sets - 10 reps - Seated Scapular Retraction  - 1 x daily - 7 x weekly - 3 sets - 10 reps - Wrist AROM Flexion Extension  - 1 x daily - 7 x weekly - 3 sets - 10 reps - Wrist Radial Ulnar Deviation AROM  - 1 x daily - 7 x weekly - 3 sets - 10 reps - Seated Forearm Pronation and Supination AROM  - 1 x daily - 7 x weekly - 3 sets - 10 reps - Single Arm Doorway Pec Stretch at 60 Elevation  - 1 x daily - 7 x weekly - 1 sets - 10 reps - 10 seconds hold - Prone Single Arm Shoulder Y  - 1 x daily - 7 x weekly - 3 sets - 10 reps - Prone Shoulder Horizontal Abduction  - 1 x daily - 7 x weekly - 3 sets - 10 reps - Prone Shoulder Extension - Single Arm  - 1 x daily - 7 x weekly - 3 sets - 10 reps - Seated Isometric Elbow Flexion  - 1 x daily - 7 x weekly - 3 sets - 10 reps - 3 seconds hold - Seated Isometric Elbow Extension  -  1 x daily - 7 x weekly - 3 sets - 10 reps - 3 seconds hold - Forearm Plank on Wall  - 1 x daily - 7 x weekly - 1 sets - 3 reps - 30 seconds hold  ASSESSMENT:  CLINICAL IMPRESSION: Pt with good tolerance to initiation of light strengthening today.  No increase in pain with any activities. Updated HEP and educated pt on plan to progress per MD orders and protocol   OBJECTIVE IMPAIRMENTS: decreased ROM, decreased strength, increased edema, impaired UE functional use, and pain.   GOALS: Goals reviewed with patient? Yes  SHORT TERM GOALS: Target date:  05/04/2023    Patient will demonstrate full elbow extension on the right side Baseline: Goal status: INITIAL  2.  Patient will demonstrate full elbow flexion on the right side Baseline:  Goal status: MET  3.  Patient will be independent with basic HEP to begin strengthening program for work Baseline:  Goal status: INITIAL  4.  Patient will show no signs of edema in his right hand Baseline:  Goal status: MET   LONG TERM GOALS: Target date: 06/15/2023    Patient will lift 10 to 15 pound box without increased biceps pain when cleared by MD in order to return to work Baseline:  Goal status: INITIAL  2.  Patient will demonstrate full right elbow and shoulder strength in order to perform ADLs Baseline:  Goal status: INITIAL  3.  Patient will perform all hygiene and daily functional tasks with right upper extremity without increased pain Baseline:  Goal status: INITIAL  PLAN: PT FREQUENCY: 1-2x/week  PT DURATION: 12 weeks  PLANNED INTERVENTIONS:  Therapeutic exercises, Therapeutic activity, Neuromuscular re-education,  Patient/Family education, Self Care, Joint mobilization, Stair training, DME instructions, Aquatic Therapy, Dry Needling, Electrical stimulation, Cryotherapy, Moist heat, Taping, Manual therapy, and Re-evaluation.   PLAN FOR NEXT SESSION:  Per MD "light weights", shoulder, elbow, wrist strength. Continue per protocol. Try ball on wall, light weight carries, serratus wall slide  Bristyn Kulesza, PT 05/01/2023, 10:52 AM

## 2023-05-06 ENCOUNTER — Ambulatory Visit: Payer: BC Managed Care – PPO

## 2023-05-06 DIAGNOSIS — M25521 Pain in right elbow: Secondary | ICD-10-CM | POA: Diagnosis not present

## 2023-05-06 DIAGNOSIS — M25621 Stiffness of right elbow, not elsewhere classified: Secondary | ICD-10-CM | POA: Diagnosis not present

## 2023-05-06 DIAGNOSIS — R600 Localized edema: Secondary | ICD-10-CM

## 2023-05-06 NOTE — Therapy (Signed)
OUTPATIENT PHYSICAL THERAPY TREATMENT   Patient Name: Eduardo Turner MRN: [id number] DOB:05-02-1961, 62 y.o., male Today's Date: 05/06/2023  END OF SESSION:  PT End of Session - 05/06/23 1143     Visit Number 10    Number of Visits 24    Date for PT Re-Evaluation 06/15/23    Authorization Type [employer]    PT Start Time 1145    PT Stop Time 1223    PT Time Calculation (min) 38 min    Activity Tolerance Patient tolerated treatment well    Behavior During Therapy WFL for tasks assessed/performed            Past Medical History:  Diagnosis Date   Allergy    Asthma    Dysplastic nevi    GERD (gastroesophageal reflux disease)    Hypertension    Multiple lipomas    Peptic esophageal ulcer    Stroke (HCC) 10/30/2019   Past Surgical History:  Procedure Laterality Date   COLONOSCOPY     KNEE SURGERY Left    SHOULDER SURGERY     bilateral   Patient Active Problem List   Diagnosis Date Noted   Fatty liver 10/31/2021   Acute medial meniscus tear, left, initial encounter 02/07/2021   History of adenomatous polyp of colon 03/19/2020   Dysarthria 01/03/2020   History of stroke 11/07/2019   Hyperlipidemia 07/21/2016   Ophthalmic migraine 09/20/2010   GERD 09/19/2009   Recurrent major depression in full remission (HCC) 06/21/2009   DYSPLASTIC NEVUS, BACK 02/05/2008   Lipoma 01/31/2008   ERECTILE DYSFUNCTION, MILD 03/17/2007   Essential hypertension 02/22/2007   Allergic rhinitis 02/22/2007   Asthma 02/22/2007    PCP: Dr Tana Conch  REFERRING PROVIDER: Dr Huel Cote   REFERRING DIAG:  Diagnosis  [phone] (ICD-10-CM) - Rupture of right distal biceps tendon, initial encounter    THERAPY DIAG:  Pain in right elbow  Stiffness of right elbow, not elsewhere classified  Localized edema  Rationale for Evaluation and Treatment: Rehabilitation  ONSET DATE: Sx on 03/19/2023  SUBJECTIVE:                                                                                                                                                                                       SUBJECTIVE STATEMENT: Patient reports he was sore after last session but no pain. Patient states 05/14/23 is the deadline for his leave date for work but is planning on extending due to physicality of work.   Hand dominance: Right  PERTINENT HISTORY: Multiple Myloma, HTN, Stroke, Asthma, bilateral RTC   The patient was using a rope swing when he felt a pull in  his arm. He suffered a distal biceps strain. He had a repair on 03/19/2023.  At this time his pain is well-controlled.  He is currently in a sling.  He does have significant swelling in his hand.  PAIN:  Are you having pain? Yes: NPRS scale: 1/10 Pain location: aching  Pain description: aching  Aggravating factors: being active Relieving factors: rest   PRECAUTIONS: Per MD visit 9/18: OK to begin with "small weights and progress per protocol"    WEIGHT BEARING RESTRICTIONS: Yes WBAT  FALLS:  Has patient fallen in last 6 months? Fell off rope swing  LIVING ENVIRONMENT: Nothing pertinent   OCCUPATION: International aid/development worker at [employer]   PLOF: Independent  PATIENT GOALS:   To return to full function  NEXT MD VISIT: 06/12/23  OBJECTIVE:      SENSATION: Mild numbness into the hand   POSTURE:  Edema: significant edema into the hand.   UPPER EXTREMITY ROM:   Passive ROM Right eval Left eval Right 8/27 Right 04/22/23 Right 9/20  Shoulder flexion       Shoulder extension       Shoulder abduction       Shoulder adduction       Shoulder internal rotation       Shoulder external rotation       Elbow flexion 90 degrees  121 degrees 135   Elbow extension 30 degrees without coming to end feel  25 degrees without coming to end feel 4 degrees before end feel 0 degrees  Wrist flexion Within normal limits      Wrist extension Within normal limits      Wrist ulnar deviation       Wrist radial deviation       Wrist  pronation       Wrist supination       (Blank rows = not tested)  UPPER EXTREMITY MMT:  MMT Right eval Left eval  Shoulder flexion    Shoulder extension    Shoulder abduction    Shoulder adduction    Shoulder internal rotation    Shoulder external rotation    Middle trapezius    Lower trapezius    Elbow flexion    Elbow extension    Wrist flexion    Wrist extension    Wrist ulnar deviation    Wrist radial deviation    Wrist pronation    Wrist supination    Grip strength (lbs)    (Blank rows = not tested) not tested secondary to recent surgery    PALPATION:  No unexpected tenderness to palpation   TODAY'S TREATMENT:                                                                                                                                         [facility] Adult PT Treatment:  DATE: 05/06/2023 Therapeutic Exercise: Seated: Scap squeezes Shoulder shrugs Bkwd shoulder circles Cervical lateral flexion stretch Prone I, T, Y x 15 Elbow AROM  Wrist flexion 2# x30 Wrist ext 2# x30 Radial deviation 2# x30 Wrist supination/pronation 2# x 30 Standing: Small circles with ball on wall at 90 degrees 2x20 CW/CCW each Rows GTB x20 Bent elbow wall plank 3x30" SA wall slides with foam roller x10 SA wall slides on wall x15 --> cues for scap retraction/depression Corner pec stretch 3x30" Submax isometrics at 90 degrees elbow flexion - flexion and extension 10 x 3 sec    [facility] Adult PT Treatment:                                                DATE: 05/01/23 Therapeutic Exercise: Prone I, T, Y x 15 Submax isometrics at 90 degrees elbow flexion - flexion and extension 10 x 3 sec Bent elbow wall plank 2 x 30 seconds Rows green TB x 15 Shoulder ext green TB x 15 Bilat shoulder ER green TB x 15 Wrist flexion 2# x 30 Wrist ext 2# x 30 Radial deviation 2# x 30 Wrist supination/pronation 2# x 30 Rhythmic stabilization Rt shoulder in  supine 3 x 30 sec   [facility] Adult PT Treatment:                                                DATE: 04/22/23 Therapeutic Exercise: Shoulder flexion finger ladder within elbow ROM limits x 10 Doorway ER/pec stretch within limites of protocol (no shoulder hyperextension) Prone T, I all 2 x 10 (no shoulder hyperextension) Wrist AROM flex/ext with elbow at 90 Wrist radial/ulnar deviation with elbow at 90 Elbow pronation/supination ROM as tolerated with elbow supported at 90 degrees Manual Therapy: Wrist PROM as tolerated   PATIENT EDUCATION: Education details: Updated HEP Person educated: Patient Education method: Explanation, Demonstration, Tactile cues, Verbal cues, and Handouts Education comprehension: verbalized understanding, returned demonstration, verbal cues required, tactile cues required, and needs further education  HOME EXERCISE PROGRAM: Access Code: [email or web address] URL: [email or web address] Date: 05/06/2023 Prepared by: Carlynn Herald  Exercises - Putty Squeezes  - 1 x daily - 7 x weekly - 3 sets - 10 reps - Seated Scapular Retraction  - 1 x daily - 7 x weekly - 3 sets - 10 reps - Wrist AROM Flexion Extension  - 1 x daily - 7 x weekly - 3 sets - 10 reps - Wrist Radial Ulnar Deviation AROM  - 1 x daily - 7 x weekly - 3 sets - 10 reps - Seated Forearm Pronation and Supination AROM  - 1 x daily - 7 x weekly - 3 sets - 10 reps - Single Arm Doorway Pec Stretch at 60 Elevation  - 1 x daily - 7 x weekly - 1 sets - 10 reps - 10 seconds hold - Prone Single Arm Shoulder Y  - 1 x daily - 7 x weekly - 3 sets - 10 reps - Prone Shoulder Horizontal Abduction  - 1 x daily - 7 x weekly - 3 sets - 10 reps - Prone Shoulder Extension - Single Arm  - 1 x daily - 7 x weekly - 3 sets - 10 reps -  Seated Isometric Elbow Flexion  - 1 x daily - 7 x weekly - 3 sets - 10 reps - 3 seconds hold - Seated Isometric Elbow Extension  - 1 x daily - 7 x weekly - 3 sets - 10 reps - 3 seconds hold -  Forearm Plank on Wall  - 1 x daily - 7 x weekly - 1 sets - 3 reps - 30 seconds hold - Corner Pec Major Stretch  - 1 x daily - 7 x weekly - 1 sets - 3-5 reps - 30 sec hold - Serratus Activation at Wall  - 1 x daily - 7 x weekly - 3 sets - 10 reps  ASSESSMENT:  CLINICAL IMPRESSION: Short term goals updated; patient has met and continues to have mild swelling in R hand along lower knuckles. Noted tightness is pecs and increased forward rounded shoulder compensation with postural exercises; pec stretch in corner provided with good response from patient. Shoulder stabilization and elbow isometric exercises progressed within protocol guidelines.   OBJECTIVE IMPAIRMENTS: decreased ROM, decreased strength, increased edema, impaired UE functional use, and pain.   GOALS: Goals reviewed with patient? Yes  SHORT TERM GOALS: Target date: 05/04/2023  Patient will demonstrate full elbow extension on the right side Baseline: Goal status: MET  2.  Patient will demonstrate full elbow flexion on the right side Baseline:  Goal status: MET  3.  Patient will be independent with basic HEP to begin strengthening program for work Baseline:  Goal status: MET  4.  Patient will show no signs of edema in his right hand Baseline: mild swelling at base of  Goal status: IN PROGRESS   LONG TERM GOALS: Target date: 06/15/2023  Patient will lift 10 to 15 pound box without increased biceps pain when cleared by MD in order to return to work Baseline:  Goal status: INITIAL  2.  Patient will demonstrate full right elbow and shoulder strength in order to perform ADLs Baseline:  Goal status: INITIAL  3.  Patient will perform all hygiene and daily functional tasks with right upper extremity without increased pain Baseline:  Goal status: INITIAL  PLAN: PT FREQUENCY: 1-2x/week  PT DURATION: 12 weeks  PLANNED INTERVENTIONS:  Therapeutic exercises, Therapeutic activity, Neuromuscular re-education,   Patient/Family education, Self Care, Joint mobilization, Stair training, DME instructions, Aquatic Therapy, Dry Needling, Electrical stimulation, Cryotherapy, Moist heat, Taping, Manual therapy, and Re-evaluation.   PLAN FOR NEXT SESSION: Per MD "light weights", shoulder, elbow, wrist strength. Continue per protocol. Continue ball on wall, light weight carries, serratus wall slide   Sanjuana Mae, PTA 05/06/2023, 12:25 PM

## 2023-05-13 ENCOUNTER — Encounter: Payer: Self-pay | Admitting: Physical Therapy

## 2023-05-13 ENCOUNTER — Ambulatory Visit: Payer: BC Managed Care – PPO | Attending: Orthopaedic Surgery | Admitting: Physical Therapy

## 2023-05-13 DIAGNOSIS — M25621 Stiffness of right elbow, not elsewhere classified: Secondary | ICD-10-CM | POA: Diagnosis not present

## 2023-05-13 DIAGNOSIS — M25521 Pain in right elbow: Secondary | ICD-10-CM | POA: Insufficient documentation

## 2023-05-13 DIAGNOSIS — R600 Localized edema: Secondary | ICD-10-CM | POA: Diagnosis not present

## 2023-05-13 NOTE — Therapy (Signed)
OUTPATIENT PHYSICAL THERAPY TREATMENT   Patient Name: Eduardo Turner MRN: [id number] DOB:1961/01/01, 62 y.o., male Today's Date: 05/13/2023  END OF SESSION:  PT End of Session - 05/13/23 0923     Visit Number 11    Number of Visits 24    Date for PT Re-Evaluation 06/15/23    Authorization Type BCBS    PT Start Time 0845    PT Stop Time 0925    PT Time Calculation (min) 40 min    Activity Tolerance Patient tolerated treatment well    Behavior During Therapy [facility] for tasks assessed/performed             Past Medical History:  Diagnosis Date   Allergy    Asthma    Dysplastic nevi    GERD (gastroesophageal reflux disease)    Hypertension    Multiple lipomas    Peptic esophageal ulcer    Stroke (HCC) 10/30/2019   Past Surgical History:  Procedure Laterality Date   COLONOSCOPY     KNEE SURGERY Left    SHOULDER SURGERY     bilateral   Patient Active Problem List   Diagnosis Date Noted   Fatty liver 10/31/2021   Acute medial meniscus tear, left, initial encounter 02/07/2021   History of adenomatous polyp of colon 03/19/2020   Dysarthria 01/03/2020   History of stroke 11/07/2019   Hyperlipidemia 07/21/2016   Ophthalmic migraine 09/20/2010   GERD 09/19/2009   Recurrent major depression in full remission (HCC) 06/21/2009   DYSPLASTIC NEVUS, BACK 02/05/2008   Lipoma 01/31/2008   ERECTILE DYSFUNCTION, MILD 03/17/2007   Essential hypertension 02/22/2007   Allergic rhinitis 02/22/2007   Asthma 02/22/2007    PCP: Dr Tana Conch  REFERRING PROVIDER: Dr Huel Cote   REFERRING DIAG:  Diagnosis  [phone] (ICD-10-CM) - Rupture of right distal biceps tendon, initial encounter    THERAPY DIAG:  Pain in right elbow  Stiffness of right elbow, not elsewhere classified  Localized edema  Rationale for Evaluation and Treatment: Rehabilitation  ONSET DATE: Sx on 03/19/2023  SUBJECTIVE:                                                                                                                                                                                       SUBJECTIVE STATEMENT: Patient reports he used a broom and did some laundry yesterday. He is "sore" but no pain  Hand dominance: Right  PERTINENT HISTORY: Multiple Myloma, HTN, Stroke, Asthma, bilateral RTC   The patient was using a rope swing when he felt a pull in his arm. He suffered a distal biceps strain. He had a repair on 03/19/2023.  At  this time his pain is well-controlled.  He is currently in a sling.  He does have significant swelling in his hand.  PAIN:  Are you having pain? Yes: NPRS scale: 1/10 Pain location: aching  Pain description: aching  Aggravating factors: being active Relieving factors: rest   PRECAUTIONS: Per MD visit 9/18: OK to begin with "small weights and progress per protocol"    WEIGHT BEARING RESTRICTIONS: Yes WBAT  FALLS:  Has patient fallen in last 6 months? Fell off rope swing  LIVING ENVIRONMENT: Nothing pertinent   OCCUPATION: International aid/development worker at [employer]   PLOF: Independent  PATIENT GOALS:   To return to full function  NEXT MD VISIT: 06/12/23  OBJECTIVE:      SENSATION: Mild numbness into the hand   POSTURE:  Edema: significant edema into the hand.   UPPER EXTREMITY ROM:   Passive ROM Right eval Left eval Right 8/27 Right 04/22/23 Right 9/20  Shoulder flexion       Shoulder extension       Shoulder abduction       Shoulder adduction       Shoulder internal rotation       Shoulder external rotation       Elbow flexion 90 degrees  121 degrees 135   Elbow extension 30 degrees without coming to end feel  25 degrees without coming to end feel 4 degrees before end feel 0 degrees  Wrist flexion Within normal limits      Wrist extension Within normal limits      Wrist ulnar deviation       Wrist radial deviation       Wrist pronation       Wrist supination       (Blank rows = not tested)  UPPER EXTREMITY MMT:  MMT  Right eval Left eval  Shoulder flexion    Shoulder extension    Shoulder abduction    Shoulder adduction    Shoulder internal rotation    Shoulder external rotation    Middle trapezius    Lower trapezius    Elbow flexion    Elbow extension    Wrist flexion    Wrist extension    Wrist ulnar deviation    Wrist radial deviation    Wrist pronation    Wrist supination    Grip strength (lbs)    (Blank rows = not tested) not tested secondary to recent surgery    PALPATION:  No unexpected tenderness to palpation   TODAY'S TREATMENT:                                                                                                                                         [facility] Adult PT Treatment:  DATE: 05/13/23 Therapeutic Exercise: UBE L0 x 4 min alt fwd/bkwd Ball on wall circles CW/CCW 2 x 1 min Bent elbow plank on wall 3 x 30 sec Rows blue TB x 20 Corner pec stretch 2 x 30 sec 90/90 hold 3# x 2 laps Wrist flexion 3# x30 Wrist ext 3# x30 Radial deviation 3# x30 Wrist supination/pronation 3# x 30 Prone I, T1# x 20, Y 0# x 20 Submax isometrics at 90 degrees elbow flexion - flexion and extension 10 x 3 sec   [facility] Adult PT Treatment:                                                DATE: 05/06/2023 Therapeutic Exercise: Seated: Scap squeezes Shoulder shrugs Bkwd shoulder circles Cervical lateral flexion stretch Prone I, T, Y x 15 Elbow AROM  Wrist flexion 2# x30 Wrist ext 2# x30 Radial deviation 2# x30 Wrist supination/pronation 2# x 30 Standing: Small circles with ball on wall at 90 degrees 2x20 CW/CCW each Rows GTB x20 Bent elbow wall plank 3x30" SA wall slides with foam roller x10 SA wall slides on wall x15 --> cues for scap retraction/depression Corner pec stretch 3x30" Submax isometrics at 90 degrees elbow flexion - flexion and extension 10 x 3 sec    [facility] Adult PT Treatment:                                                 DATE: 05/01/23 Therapeutic Exercise: Prone I, T, Y x 15 Submax isometrics at 90 degrees elbow flexion - flexion and extension 10 x 3 sec Bent elbow wall plank 2 x 30 seconds Rows green TB x 15 Shoulder ext green TB x 15 Bilat shoulder ER green TB x 15 Wrist flexion 2# x 30 Wrist ext 2# x 30 Radial deviation 2# x 30 Wrist supination/pronation 2# x 30 Rhythmic stabilization Rt shoulder in supine 3 x 30 sec  PATIENT EDUCATION: Education details: Updated HEP Person educated: Patient Education method: Explanation, Demonstration, Tactile cues, Verbal cues, and Handouts Education comprehension: verbalized understanding, returned demonstration, verbal cues required, tactile cues required, and needs further education  HOME EXERCISE PROGRAM: Access Code: [email or web address] URL: [email or web address] Date: 05/06/2023 Prepared by: Carlynn Herald  Exercises - Putty Squeezes  - 1 x daily - 7 x weekly - 3 sets - 10 reps - Seated Scapular Retraction  - 1 x daily - 7 x weekly - 3 sets - 10 reps - Wrist AROM Flexion Extension  - 1 x daily - 7 x weekly - 3 sets - 10 reps - Wrist Radial Ulnar Deviation AROM  - 1 x daily - 7 x weekly - 3 sets - 10 reps - Seated Forearm Pronation and Supination AROM  - 1 x daily - 7 x weekly - 3 sets - 10 reps - Single Arm Doorway Pec Stretch at 60 Elevation  - 1 x daily - 7 x weekly - 1 sets - 10 reps - 10 seconds hold - Prone Single Arm Shoulder Y  - 1 x daily - 7 x weekly - 3 sets - 10 reps - Prone Shoulder Horizontal Abduction  - 1 x daily - 7 x weekly - 3 sets - 10 reps -  Prone Shoulder Extension - Single Arm  - 1 x daily - 7 x weekly - 3 sets - 10 reps - Seated Isometric Elbow Flexion  - 1 x daily - 7 x weekly - 3 sets - 10 reps - 3 seconds hold - Seated Isometric Elbow Extension  - 1 x daily - 7 x weekly - 3 sets - 10 reps - 3 seconds hold - Forearm Plank on Wall  - 1 x daily - 7 x weekly - 1 sets - 3 reps - 30 seconds hold - Corner Pec Major Stretch  -  1 x daily - 7 x weekly - 1 sets - 3-5 reps - 30 sec hold - Serratus Activation at Wall  - 1 x daily - 7 x weekly - 3 sets - 10 reps  ASSESSMENT:  CLINICAL IMPRESSION: Pt with good tolerance to increased endurance exercises today. No pain with holds or ball on wall. He was able to increase weight for wrist exercises without increase in symptoms. He plans to return to work next week and he returns to MD 10/18.   OBJECTIVE IMPAIRMENTS: decreased ROM, decreased strength, increased edema, impaired UE functional use, and pain.   GOALS: Goals reviewed with patient? Yes  SHORT TERM GOALS: Target date: 05/04/2023  Patient will demonstrate full elbow extension on the right side Baseline: Goal status: MET  2.  Patient will demonstrate full elbow flexion on the right side Baseline:  Goal status: MET  3.  Patient will be independent with basic HEP to begin strengthening program for work Baseline:  Goal status: MET  4.  Patient will show no signs of edema in his right hand Baseline: mild swelling at base of  Goal status: IN PROGRESS   LONG TERM GOALS: Target date: 06/15/2023  Patient will lift 10 to 15 pound box without increased biceps pain when cleared by MD in order to return to work Baseline:  Goal status: INITIAL  2.  Patient will demonstrate full right elbow and shoulder strength in order to perform ADLs Baseline:  Goal status: INITIAL  3.  Patient will perform all hygiene and daily functional tasks with right upper extremity without increased pain Baseline:  Goal status: MET  PLAN: PT FREQUENCY: 1-2x/week  PT DURATION: 12 weeks  PLANNED INTERVENTIONS:  Therapeutic exercises, Therapeutic activity, Neuromuscular re-education,  Patient/Family education, Self Care, Joint mobilization, Stair training, DME instructions, Aquatic Therapy, Dry Needling, Electrical stimulation, Cryotherapy, Moist heat, Taping, Manual therapy, and Re-evaluation.   PLAN FOR NEXT SESSION: Per MD  "light weights", shoulder, elbow, wrist strength. Continue per protocol. Continue ball on wall, light weight carries, serratus wall slide   Argelio Granier, PT 05/13/2023, 9:24 AM

## 2023-05-15 ENCOUNTER — Telehealth: Payer: Self-pay | Admitting: Orthopaedic Surgery

## 2023-05-15 NOTE — Telephone Encounter (Signed)
FMLA forms received. Please advise of work status. Please provide note. Last seen 09/18. Thank you.

## 2023-05-19 ENCOUNTER — Ambulatory Visit: Payer: BC Managed Care – PPO

## 2023-05-19 DIAGNOSIS — M25521 Pain in right elbow: Secondary | ICD-10-CM | POA: Diagnosis not present

## 2023-05-19 DIAGNOSIS — R600 Localized edema: Secondary | ICD-10-CM

## 2023-05-19 DIAGNOSIS — M25621 Stiffness of right elbow, not elsewhere classified: Secondary | ICD-10-CM | POA: Diagnosis not present

## 2023-05-19 NOTE — Therapy (Signed)
OUTPATIENT PHYSICAL THERAPY TREATMENT   Patient Name: Eduardo Turner MRN: [id number] DOB:[birth year], 62 y.o., male Today's Date: 05/19/2023  END OF SESSION:  PT End of Session - 05/19/23 0843     Visit Number 12    Number of Visits 24    Date for PT Re-Evaluation 06/15/23    Authorization Type BCBS    PT Start Time 0845    PT Stop Time 0925    PT Time Calculation (min) 40 min    Activity Tolerance Patient tolerated treatment well    Behavior During Therapy [facility] for tasks assessed/performed            Past Medical History:  Diagnosis Date   Allergy    Asthma    Dysplastic nevi    GERD (gastroesophageal reflux disease)    Hypertension    Multiple lipomas    Peptic esophageal ulcer    Stroke (HCC) 10/30/2019   Past Surgical History:  Procedure Laterality Date   COLONOSCOPY     KNEE SURGERY Left    SHOULDER SURGERY     bilateral   Patient Active Problem List   Diagnosis Date Noted   Fatty liver 10/31/2021   Acute medial meniscus tear, left, initial encounter 02/07/2021   History of adenomatous polyp of colon 03/19/2020   Dysarthria 01/03/2020   History of stroke 11/07/2019   Hyperlipidemia 07/21/2016   Ophthalmic migraine 09/20/2010   GERD 09/19/2009   Recurrent major depression in full remission (HCC) 06/21/2009   DYSPLASTIC NEVUS, BACK 02/05/2008   Lipoma 01/31/2008   ERECTILE DYSFUNCTION, MILD 03/17/2007   Essential hypertension 02/22/2007   Allergic rhinitis 02/22/2007   Asthma 02/22/2007    PCP: Dr Tana Conch  REFERRING PROVIDER: Dr Huel Cote   REFERRING DIAG:  Diagnosis  [phone] (ICD-10-CM) - Rupture of right distal biceps tendon, initial encounter    THERAPY DIAG:  Pain in right elbow  Stiffness of right elbow, not elsewhere classified  Localized edema  Rationale for Evaluation and Treatment: Rehabilitation  ONSET DATE: Sx on 03/19/2023  SUBJECTIVE:                                                                                                                                                                                       SUBJECTIVE STATEMENT: Patient reports he is doing more around the house - mowing and laundry - and had no pain just soreness. Patient states he has follow-up with surgeon next Friday and is pretty sure he will be cleared to return to work on 06/01/23. Hand dominance: Right  PERTINENT HISTORY: Multiple Myloma, HTN, Stroke, Asthma, bilateral RTC   The patient was using a  rope swing when he felt a pull in his arm. He suffered a distal biceps strain. He had a repair on 03/19/2023.  At this time his pain is well-controlled.  He is currently in a sling.  He does have significant swelling in his hand.  PAIN:  Are you having pain? Yes: NPRS scale: 1/10 Pain location: aching  Pain description: aching  Aggravating factors: being active Relieving factors: rest   PRECAUTIONS: Per MD visit 9/18: OK to begin with "small weights and progress per protocol"   WEIGHT BEARING RESTRICTIONS: Yes WBAT  FALLS:  Has patient fallen in last 6 months? Fell off rope swing  LIVING ENVIRONMENT: Nothing pertinent   OCCUPATION: International aid/development worker at [employer]   PLOF: Independent  PATIENT GOALS:   To return to full function  NEXT MD VISIT: 05/29/23  OBJECTIVE:      SENSATION: Mild numbness into the hand   POSTURE:  Edema: significant edema into the hand.   UPPER EXTREMITY ROM:   Passive ROM Right eval Left eval Right 8/27 Right 04/22/23 Right 9/20  Shoulder flexion       Shoulder extension       Shoulder abduction       Shoulder adduction       Shoulder internal rotation       Shoulder external rotation       Elbow flexion 90 degrees  121 degrees 135   Elbow extension 30 degrees without coming to end feel  25 degrees without coming to end feel 4 degrees before end feel 0 degrees  Wrist flexion Within normal limits      Wrist extension Within normal limits      Wrist ulnar deviation        Wrist radial deviation       Wrist pronation       Wrist supination       (Blank rows = not tested)  UPPER EXTREMITY MMT:  MMT Right eval Left eval  Shoulder flexion    Shoulder extension    Shoulder abduction    Shoulder adduction    Shoulder internal rotation    Shoulder external rotation    Middle trapezius    Lower trapezius    Elbow flexion    Elbow extension    Wrist flexion    Wrist extension    Wrist ulnar deviation    Wrist radial deviation    Wrist pronation    Wrist supination    Grip strength (lbs)    (Blank rows = not tested) not tested secondary to recent surgery    PALPATION:  No unexpected tenderness to palpation   TODAY'S TREATMENT:                                                                                                                                         [facility] Adult PT Treatment:  DATE: 05/19/2023 Therapeutic Exercise: UBE L0 x fwd/2 min bkwd Bent elbow plank on hi table 3x30" Ball on wall circles CW/CCW x10 each way --> ABCs Prone:  I 1#DB x20, x5 2#DB    T1# x20 Y 0# x 20 Serratus wall slides 2x10 Standing scap squeezes with noodle 10x5" Isometric neutral row step bkw/fwd + GTB 2x10 Corner pec stretch 2x30" Wrist flexion 3# x30 Wrist ext 3# x30 Radial deviation 3# x30 Wrist supination/pronation 3# x 30 90/90 hold 3# x 2 laps    [facility] Adult PT Treatment:                                                DATE: 05/13/23 Therapeutic Exercise: UBE L0 x 4 min alt fwd/bkwd Ball on wall circles CW/CCW 2 x 1 min Bent elbow plank on wall 3 x 30 sec Rows blue TB x 20 Corner pec stretch 2 x 30 sec 90/90 hold 3# x 2 laps Wrist flexion 3# x30 Wrist ext 3# x30 Radial deviation 3# x30 Wrist supination/pronation 3# x 30 Prone I, T1# x 20, Y 0# x 20 Submax isometrics at 90 degrees elbow flexion - flexion and extension 10 x 3 sec    [facility] Adult PT Treatment:                                                 DATE: 05/06/2023 Therapeutic Exercise: Seated: Scap squeezes Shoulder shrugs Bkwd shoulder circles Cervical lateral flexion stretch Prone I, T, Y x 15 Elbow AROM  Wrist flexion 2# x30 Wrist ext 2# x30 Radial deviation 2# x30 Wrist supination/pronation 2# x 30 Standing: Small circles with ball on wall at 90 degrees 2x20 CW/CCW each Rows GTB x20 Bent elbow wall plank 3x30" SA wall slides with foam roller x10 SA wall slides on wall x15 --> cues for scap retraction/depression Corner pec stretch 3x30" Submax isometrics at 90 degrees elbow flexion - flexion and extension 10 x 3 sec   PATIENT EDUCATION: Education details: Updated HEP Person educated: Patient Education method: Explanation, Demonstration, Tactile cues, Verbal cues, and Handouts Education comprehension: verbalized understanding, returned demonstration, verbal cues required, tactile cues required, and needs further education  HOME EXERCISE PROGRAM: Access Code: [email or web address] URL: [email or web address] Date: 05/06/2023 Prepared by: Carlynn Herald  Exercises - Putty Squeezes  - 1 x daily - 7 x weekly - 3 sets - 10 reps - Seated Scapular Retraction  - 1 x daily - 7 x weekly - 3 sets - 10 reps - Wrist AROM Flexion Extension  - 1 x daily - 7 x weekly - 3 sets - 10 reps - Wrist Radial Ulnar Deviation AROM  - 1 x daily - 7 x weekly - 3 sets - 10 reps - Seated Forearm Pronation and Supination AROM  - 1 x daily - 7 x weekly - 3 sets - 10 reps - Single Arm Doorway Pec Stretch at 60 Elevation  - 1 x daily - 7 x weekly - 1 sets - 10 reps - 10 seconds hold - Prone Single Arm Shoulder Y  - 1 x daily - 7 x weekly - 3 sets - 10 reps - Prone Shoulder Horizontal Abduction  - 1 x daily -  7 x weekly - 3 sets - 10 reps - Prone Shoulder Extension - Single Arm  - 1 x daily - 7 x weekly - 3 sets - 10 reps - Seated Isometric Elbow Flexion  - 1 x daily - 7 x weekly - 3 sets - 10 reps - 3 seconds hold - Seated  Isometric Elbow Extension  - 1 x daily - 7 x weekly - 3 sets - 10 reps - 3 seconds hold - Forearm Plank on Wall  - 1 x daily - 7 x weekly - 1 sets - 3 reps - 30 seconds hold - Corner Pec Major Stretch  - 1 x daily - 7 x weekly - 1 sets - 3-5 reps - 30 sec hold - Serratus Activation at Wall  - 1 x daily - 7 x weekly - 3 sets - 10 reps  ASSESSMENT:  CLINICAL IMPRESSION: Postural strengthening exercises progressed with no exacerbation of pain in patient and remaining within protocol guidelines. Cueing provided to improve scapula retraction/depression mechanics and stability with shoulder flexion.   OBJECTIVE IMPAIRMENTS: decreased ROM, decreased strength, increased edema, impaired UE functional use, and pain.   GOALS: Goals reviewed with patient? Yes  SHORT TERM GOALS: Target date: 05/04/2023  Patient will demonstrate full elbow extension on the right side Baseline: Goal status: MET  2.  Patient will demonstrate full elbow flexion on the right side Baseline:  Goal status: MET  3.  Patient will be independent with basic HEP to begin strengthening program for work Baseline:  Goal status: MET  4.  Patient will show no signs of edema in his right hand Baseline: mild swelling at base of  Goal status: IN PROGRESS   LONG TERM GOALS: Target date: 06/15/2023  Patient will lift 10 to 15 pound box without increased biceps pain when cleared by MD in order to return to work Baseline:  Goal status: INITIAL  2.  Patient will demonstrate full right elbow and shoulder strength in order to perform ADLs Baseline:  Goal status: INITIAL  3.  Patient will perform all hygiene and daily functional tasks with right upper extremity without increased pain Baseline:  Goal status: MET  PLAN: PT FREQUENCY: 1-2x/week  PT DURATION: 12 weeks  PLANNED INTERVENTIONS:  Therapeutic exercises, Therapeutic activity, Neuromuscular re-education,  Patient/Family education, Self Care, Joint mobilization,  Stair training, DME instructions, Aquatic Therapy, Dry Needling, Electrical stimulation, Cryotherapy, Moist heat, Taping, Manual therapy, and Re-evaluation.   PLAN FOR NEXT SESSION: Per MD "light weights", shoulder, elbow, wrist strength. Continue per protocol. Continue ball on wall, light weight carries, serratus wall slide   Sanjuana Mae, PTA 05/19/2023, 9:28 AM

## 2023-05-20 ENCOUNTER — Encounter (HOSPITAL_BASED_OUTPATIENT_CLINIC_OR_DEPARTMENT_OTHER): Payer: Self-pay | Admitting: Orthopaedic Surgery

## 2023-05-20 NOTE — Telephone Encounter (Signed)
LMOM to cb

## 2023-05-20 NOTE — Telephone Encounter (Signed)
Noted for [employer]. 

## 2023-05-26 ENCOUNTER — Ambulatory Visit: Payer: BC Managed Care – PPO | Admitting: Physical Therapy

## 2023-05-26 ENCOUNTER — Encounter: Payer: Self-pay | Admitting: Physical Therapy

## 2023-05-26 DIAGNOSIS — M25521 Pain in right elbow: Secondary | ICD-10-CM

## 2023-05-26 DIAGNOSIS — M25621 Stiffness of right elbow, not elsewhere classified: Secondary | ICD-10-CM

## 2023-05-26 DIAGNOSIS — R600 Localized edema: Secondary | ICD-10-CM | POA: Diagnosis not present

## 2023-05-26 NOTE — Therapy (Signed)
OUTPATIENT PHYSICAL THERAPY TREATMENT   Patient Name: Eduardo Turner MRN: [id number] DOB:[birth year], 62 y.o., male Today's Date: 05/26/2023  END OF SESSION:  PT End of Session - 05/26/23 1043     Visit Number 13    Number of Visits 24    Date for PT Re-Evaluation 06/15/23    Authorization Type [employer]    PT Start Time 1008    PT Stop Time 1046    PT Time Calculation (min) 38 min    Activity Tolerance Patient tolerated treatment well    Behavior During Therapy WFL for tasks assessed/performed             Past Medical History:  Diagnosis Date   Allergy    Asthma    Dysplastic nevi    GERD (gastroesophageal reflux disease)    Hypertension    Multiple lipomas    Peptic esophageal ulcer    Stroke (HCC) 10/30/2019   Past Surgical History:  Procedure Laterality Date   COLONOSCOPY     KNEE SURGERY Left    SHOULDER SURGERY     bilateral   Patient Active Problem List   Diagnosis Date Noted   Fatty liver 10/31/2021   Acute medial meniscus tear, left, initial encounter 02/07/2021   History of adenomatous polyp of colon 03/19/2020   Dysarthria 01/03/2020   History of stroke 11/07/2019   Hyperlipidemia 07/21/2016   Ophthalmic migraine 09/20/2010   GERD 09/19/2009   Recurrent major depression in full remission (HCC) 06/21/2009   DYSPLASTIC NEVUS, BACK 02/05/2008   Lipoma 01/31/2008   ERECTILE DYSFUNCTION, MILD 03/17/2007   Essential hypertension 02/22/2007   Allergic rhinitis 02/22/2007   Asthma 02/22/2007    PCP: Dr Tana Conch  REFERRING PROVIDER: Dr Huel Cote   REFERRING DIAG:  Diagnosis  [phone] (ICD-10-CM) - Rupture of right distal biceps tendon, initial encounter    THERAPY DIAG:  Pain in right elbow  Stiffness of right elbow, not elsewhere classified  Localized edema  Rationale for Evaluation and Treatment: Rehabilitation  ONSET DATE: Sx on 03/19/2023  SUBJECTIVE:                                                                                                                                                                                       SUBJECTIVE STATEMENT: Patient reports he plans to return to work next week. He sees Careers adviser on Friday. Hand dominance: Right  PERTINENT HISTORY: Multiple Myloma, HTN, Stroke, Asthma, bilateral RTC   The patient was using a rope swing when he felt a pull in his arm. He suffered a distal biceps strain. He had a repair on 03/19/2023.  At this time his  pain is well-controlled.  He is currently in a sling.  He does have significant swelling in his hand.  PAIN:  Are you having pain? Yes: NPRS scale: 1/10 Pain location: aching  Pain description: aching  Aggravating factors: being active Relieving factors: rest   PRECAUTIONS: Per MD visit 9/18: OK to begin with "small weights and progress per protocol"   WEIGHT BEARING RESTRICTIONS: Yes WBAT  FALLS:  Has patient fallen in last 6 months? Fell off rope swing  LIVING ENVIRONMENT: Nothing pertinent   OCCUPATION: International aid/development worker at [employer]   PLOF: Independent  PATIENT GOALS:   To return to full function  NEXT MD VISIT: 05/29/23  OBJECTIVE:      SENSATION: Mild numbness into the hand   POSTURE:  Edema: significant edema into the hand.   UPPER EXTREMITY ROM:   Passive ROM Right eval Left eval Right 8/27 Right 04/22/23 Right 9/20  Shoulder flexion       Shoulder extension       Shoulder abduction       Shoulder adduction       Shoulder internal rotation       Shoulder external rotation       Elbow flexion 90 degrees  121 degrees 135   Elbow extension 30 degrees without coming to end feel  25 degrees without coming to end feel 4 degrees before end feel 0 degrees  Wrist flexion Within normal limits      Wrist extension Within normal limits      Wrist ulnar deviation       Wrist radial deviation       Wrist pronation       Wrist supination       (Blank rows = not tested)  UPPER EXTREMITY MMT:  MMT Right eval  Left eval  Shoulder flexion    Shoulder extension    Shoulder abduction    Shoulder adduction    Shoulder internal rotation    Shoulder external rotation    Middle trapezius    Lower trapezius    Elbow flexion    Elbow extension    Wrist flexion    Wrist extension    Wrist ulnar deviation    Wrist radial deviation    Wrist pronation    Wrist supination    Grip strength (lbs)    (Blank rows = not tested) not tested secondary to recent surgery    PALPATION:  No unexpected tenderness to palpation   TODAY'S TREATMENT:                                                                                                                                         [facility] Adult PT Treatment:  DATE: 05/26/23 Therapeutic Exercise: UBE L0 x 4 min alt fwd/bkwd Ball on wall CW/CCW 2 x 30 sec Flexion 5# with opp isometric hold 2 x 10 Scaption 5# with opp isometric hold 2 x 10 Bicep curl 5# with opp 90 degree hold 2 x 10 Serratus wall slide red TB x 20 Forearm plank elevated table 3 x 30 sec Prone Y x 20, T 1# x 20, I 3# x 20 Wrist flexion 3# x30 Wrist ext 3# x30 Radial deviation 3# x30 Wrist supination/pronation 3# x 30 90/90 hold 3# x 2 laps   [facility] Adult PT Treatment:                                                DATE: 05/19/2023 Therapeutic Exercise: UBE L0 x fwd/2 min bkwd Bent elbow plank on hi table 3x30" Ball on wall circles CW/CCW x10 each way --> ABCs Prone:  I 1#DB x20, x5 2#DB    T1# x20 Y 0# x 20 Serratus wall slides 2x10 Standing scap squeezes with noodle 10x5" Isometric neutral row step bkw/fwd + GTB 2x10 Corner pec stretch 2x30" Wrist flexion 3# x30 Wrist ext 3# x30 Radial deviation 3# x30 Wrist supination/pronation 3# x 30 90/90 hold 3# x 2 laps    [facility] Adult PT Treatment:                                                DATE: 05/13/23 Therapeutic Exercise: UBE L0 x 4 min alt fwd/bkwd Ball on wall circles CW/CCW  2 x 1 min Bent elbow plank on wall 3 x 30 sec Rows blue TB x 20 Corner pec stretch 2 x 30 sec 90/90 hold 3# x 2 laps Wrist flexion 3# x30 Wrist ext 3# x30 Radial deviation 3# x30 Wrist supination/pronation 3# x 30 Prone I, T1# x 20, Y 0# x 20 Submax isometrics at 90 degrees elbow flexion - flexion and extension 10 x 3 sec    PATIENT EDUCATION: Education details: Updated HEP Person educated: Patient Education method: Explanation, Demonstration, Tactile cues, Verbal cues, and Handouts Education comprehension: verbalized understanding, returned demonstration, verbal cues required, tactile cues required, and needs further education  HOME EXERCISE PROGRAM: Access Code: [email or web address] URL: [email or web address] Date: 05/06/2023 Prepared by: Carlynn Herald  Exercises - Putty Squeezes  - 1 x daily - 7 x weekly - 3 sets - 10 reps - Seated Scapular Retraction  - 1 x daily - 7 x weekly - 3 sets - 10 reps - Wrist AROM Flexion Extension  - 1 x daily - 7 x weekly - 3 sets - 10 reps - Wrist Radial Ulnar Deviation AROM  - 1 x daily - 7 x weekly - 3 sets - 10 reps - Seated Forearm Pronation and Supination AROM  - 1 x daily - 7 x weekly - 3 sets - 10 reps - Single Arm Doorway Pec Stretch at 60 Elevation  - 1 x daily - 7 x weekly - 1 sets - 10 reps - 10 seconds hold - Prone Single Arm Shoulder Y  - 1 x daily - 7 x weekly - 3 sets - 10 reps - Prone Shoulder Horizontal Abduction  - 1 x daily - 7 x  weekly - 3 sets - 10 reps - Prone Shoulder Extension - Single Arm  - 1 x daily - 7 x weekly - 3 sets - 10 reps - Seated Isometric Elbow Flexion  - 1 x daily - 7 x weekly - 3 sets - 10 reps - 3 seconds hold - Seated Isometric Elbow Extension  - 1 x daily - 7 x weekly - 3 sets - 10 reps - 3 seconds hold - Forearm Plank on Wall  - 1 x daily - 7 x weekly - 1 sets - 3 reps - 30 seconds hold - Corner Pec Major Stretch  - 1 x daily - 7 x weekly - 1 sets - 3-5 reps - 30 sec hold - Serratus Activation at  Wall  - 1 x daily - 7 x weekly - 3 sets - 10 reps  ASSESSMENT:  CLINICAL IMPRESSION: Pt is able to tolerate all strengthening without increase in symptoms. PT educated pt on importance of ice and stretching during and after work - pt verbalizes understanding. Plan to assess response to return to work and continue to progress strength and endurance   OBJECTIVE IMPAIRMENTS: decreased ROM, decreased strength, increased edema, impaired UE functional use, and pain.   GOALS: Goals reviewed with patient? Yes  SHORT TERM GOALS: Target date: 05/04/2023  Patient will demonstrate full elbow extension on the right side Baseline: Goal status: MET  2.  Patient will demonstrate full elbow flexion on the right side Baseline:  Goal status: MET  3.  Patient will be independent with basic HEP to begin strengthening program for work Baseline:  Goal status: MET  4.  Patient will show no signs of edema in his right hand Baseline: mild swelling at base of  Goal status: IN PROGRESS   LONG TERM GOALS: Target date: 06/15/2023  Patient will lift 10 to 15 pound box without increased biceps pain when cleared by MD in order to return to work Baseline:  Goal status: INITIAL  2.  Patient will demonstrate full right elbow and shoulder strength in order to perform ADLs Baseline:  Goal status: INITIAL  3.  Patient will perform all hygiene and daily functional tasks with right upper extremity without increased pain Baseline:  Goal status: MET  PLAN: PT FREQUENCY: 1-2x/week  PT DURATION: 12 weeks  PLANNED INTERVENTIONS:  Therapeutic exercises, Therapeutic activity, Neuromuscular re-education,  Patient/Family education, Self Care, Joint mobilization, Stair training, DME instructions, Aquatic Therapy, Dry Needling, Electrical stimulation, Cryotherapy, Moist heat, Taping, Manual therapy, and Re-evaluation.   PLAN FOR NEXT SESSION: Per MD "light weights", shoulder, elbow, wrist strength. Continue per  protocol. Continue ball on wall, light weight carries, serratus wall slide   Derisha Funderburke, PT 05/26/2023, 10:44 AM

## 2023-05-29 ENCOUNTER — Ambulatory Visit (HOSPITAL_BASED_OUTPATIENT_CLINIC_OR_DEPARTMENT_OTHER): Payer: BC Managed Care – PPO | Admitting: Orthopaedic Surgery

## 2023-05-29 DIAGNOSIS — S46211A Strain of muscle, fascia and tendon of other parts of biceps, right arm, initial encounter: Secondary | ICD-10-CM

## 2023-05-29 NOTE — Progress Notes (Signed)
Post Operative Evaluation    Procedure/Date of Surgery: Right distal biceps tendon repair 8/8  Interval History:   12 weeks status post distal biceps tendon repair.  Overall presents doing very well.  He has been working on [employer] production planner.  Overall feels much better.   PMH/PSH/Family History/Social History/Meds/Allergies:    Past Medical History:  Diagnosis Date   Allergy    Asthma    Dysplastic nevi    GERD (gastroesophageal reflux disease)    Hypertension    Multiple lipomas    Peptic esophageal ulcer    Stroke (HCC) 10/30/2019   Past Surgical History:  Procedure Laterality Date   COLONOSCOPY     KNEE SURGERY Left    SHOULDER SURGERY     bilateral   Social History   Socioeconomic History   Marital status: Married    Spouse name: Not on file   Number of children: Not on file   Years of education: Not on file   Highest education level: Not on file  Occupational History   Not on file  Tobacco Use   Smoking status: Never   Smokeless tobacco: Never  Vaping Use   Vaping status: Never Used  Substance and Sexual Activity   Alcohol use: Yes    Alcohol/week: 0.0 standard drinks of alcohol    Comment: occ   Drug use: No   Sexual activity: Not on file  Other Topics Concern   Not on file  Social History Narrative   Married. 2 children both out of house. 4 grandkids total (1 in [place] with son, Maurice March- son of Maddy). Granddaughter 60 and grandson 5 in 05/2022- keeping them full time      International aid/development worker at [employer].    Social Determinants of Health   Financial Resource Strain: Low Risk  (10/24/2021)   Received from [facility], [facility]   Overall Financial Resource Strain (CARDIA)    Difficulty of Paying Living Expenses: Not hard at all  Food Insecurity: Not on file  Transportation Needs: Not on file  Physical Activity: Not on file  Stress: No Stress Concern Present (10/24/2021)   Received from [facility], [facility]   [employer] of Occupational Health - Occupational Stress Questionnaire    Feeling of Stress : Not at all  Social Connections: Unknown (12/10/2021)   Received from [facility], [facility]   Social Network    Social Network: Not on file   Family History  Problem Relation Age of Onset   Other Mother        routine surgery lead to sepsis- hysterectomy   Heart attack Father        age 11   Bipolar disorder Sister    Colon cancer Neg Hx    Esophageal cancer Neg Hx    Rectal cancer Neg Hx    Stomach cancer Neg Hx    Allergies  Allergen Reactions   Erythromycin Ethylsuccinate     REACTION: HIVES   Current Outpatient Medications  Medication Sig Dispense Refill   albuterol (VENTOLIN HFA) 108 (90 Base) MCG/ACT inhaler Inhale 1-2 puffs into the lungs every 6 (six) hours as needed for wheezing or shortness of breath. 6.7 g 0   aspirin EC 325 MG tablet Take 1 tablet (325 mg total) by mouth daily. 14 tablet 0  aspirin EC 81 MG tablet Take 81 mg by mouth daily. Swallow whole.     bacitracin-polymyxin b (POLYSPORIN) ophthalmic ointment Place 1 Application into the right eye every 12 (twelve) hours. apply to eye every 12 hours while awake 3.5 g 0   citalopram (CELEXA) 20 MG tablet TAKE 1 TABLET(20 MG) BY MOUTH DAILY 90 tablet 3   cyclobenzaprine (FLEXERIL) 10 MG tablet Take 1 tablet (10 mg total) by mouth 3 (three) times daily as needed for muscle spasms (do not drive for 8 hours afte use). (Patient not taking: Reported on 12/04/2022) 30 tablet 0   lisinopril (ZESTRIL) 40 MG tablet TAKE 1 TABLET BY MOUTH DAILY 90 tablet 3   loratadine (CLARITIN) 10 MG tablet Take 10 mg by mouth daily.     omeprazole (PRILOSEC) 20 MG capsule Take 1 capsule (20 mg total) by mouth daily. 90 capsule 3   oxyCODONE (ROXICODONE) 5 MG immediate release tablet Take 1 tablet (5 mg total) by mouth every 4 (four) hours as needed for severe pain or breakthrough pain. 5 tablet 0   rosuvastatin (CRESTOR) 40 MG  tablet TAKE 1 TABLET(40 MG) BY MOUTH DAILY 90 tablet 3   sildenafil (VIAGRA) 100 MG tablet Take 1 tablet (100 mg total) by mouth daily as needed for erectile dysfunction. 10 tablet 11   No current facility-administered medications for this visit.   No results found.  Review of Systems:   A ROS was performed including pertinent positives and negatives as documented in the HPI.   Musculoskeletal Exam:    There were no vitals taken for this visit.  Right elbow incision is well-healed.  Negative hook test.  Range of motion is from 2 degrees to 130 without pain.  Improved biceps bulk full supination without pain  Imaging:      I personally reviewed and interpreted the radiographs.   Assessment:   12 weeks status post right distal tendon biceps repair.  Overall doing extremely well.  At this time he will continue strengthening of the arm.  I will plan to see him back as needed.  Return to clinic note provided at this time  Plan :    -Return to work note provided      I personally saw and evaluated the patient, and participated in the management and treatment plan.  Huel Cote, MD Attending Physician, Orthopedic Surgery  This document was dictated using Dragon voice recognition software. A reasonable attempt at proof reading has been made to minimize errors.

## 2023-06-04 ENCOUNTER — Ambulatory Visit: Payer: BC Managed Care – PPO

## 2023-06-04 DIAGNOSIS — R600 Localized edema: Secondary | ICD-10-CM | POA: Diagnosis not present

## 2023-06-04 DIAGNOSIS — M25521 Pain in right elbow: Secondary | ICD-10-CM | POA: Diagnosis not present

## 2023-06-04 DIAGNOSIS — M25621 Stiffness of right elbow, not elsewhere classified: Secondary | ICD-10-CM

## 2023-06-04 NOTE — Therapy (Addendum)
OUTPATIENT PHYSICAL THERAPY TREATMENT AND DISCHARGE   Patient Name: Eduardo Turner MRN: [id number] DOB:Dec 24, 1960, 62 y.o., male Today's Date: 06/04/2023  END OF SESSION:  PT End of Session - 06/04/23 1013     Visit Number 14    Number of Visits 24    Date for PT Re-Evaluation 06/15/23    Authorization Type BC[employer]    PT Start Time 1015    PT Stop Time 1053    PT Time Calculation (min) 38 min    Activity Tolerance Patient tolerated treatment well    Behavior During Therapy WFL for tasks assessed/performed             Past Medical History:  Diagnosis Date   Allergy    Asthma    Dysplastic nevi    GERD (gastroesophageal reflux disease)    Hypertension    Multiple lipomas    Peptic esophageal ulcer    Stroke (HCC) 10/30/2019   Past Surgical History:  Procedure Laterality Date   COLONOSCOPY     KNEE SURGERY Left    SHOULDER SURGERY     bilateral   Patient Active Problem List   Diagnosis Date Noted   Fatty liver 10/31/2021   Acute medial meniscus tear, left, initial encounter 02/07/2021   History of adenomatous polyp of colon 03/19/2020   Dysarthria 01/03/2020   History of stroke 11/07/2019   Hyperlipidemia 07/21/2016   Ophthalmic migraine 09/20/2010   GERD 09/19/2009   Recurrent major depression in full remission (HCC) 06/21/2009   DYSPLASTIC NEVUS, BACK 02/05/2008   Lipoma 01/31/2008   ERECTILE DYSFUNCTION, MILD 03/17/2007   Essential hypertension 02/22/2007   Allergic rhinitis 02/22/2007   Asthma 02/22/2007    PCP: Dr Tana Conch  REFERRING PROVIDER: Dr Huel Cote   REFERRING DIAG:  Diagnosis  [phone] (ICD-10-CM) - Rupture of right distal biceps tendon, initial encounter    THERAPY DIAG:  Pain in right elbow  Stiffness of right elbow, not elsewhere classified  Localized edema  Rationale for Evaluation and Treatment: Rehabilitation  ONSET DATE: Sx on 03/19/2023  SUBJECTIVE:                                                                                                                                                                                       SUBJECTIVE STATEMENT: Patient reports surgeon released him to return to work next week. Patient states he used pressure washer at home and had fatigue at shoulder with arm above 90 degrees but no pain.  Hand dominance: Right  PERTINENT HISTORY: Multiple Myloma, HTN, Stroke, Asthma, bilateral RTC   The patient was using a rope swing when he felt a pull  in his arm. He suffered a distal biceps strain. He had a repair on 03/19/2023.  At this time his pain is well-controlled.  He is currently in a sling.  He does have significant swelling in his hand.  PAIN:  Are you having pain? Yes: NPRS scale: 1/10 Pain location: aching  Pain description: aching  Aggravating factors: being active Relieving factors: rest   PRECAUTIONS: Per MD visit 9/18: OK to begin with "small weights and progress per protocol"   WEIGHT BEARING RESTRICTIONS: Yes WBAT  FALLS:  Has patient fallen in last 6 months? Fell off rope swing  LIVING ENVIRONMENT: Nothing pertinent   OCCUPATION: International aid/development worker at [employer]   PLOF: Independent  PATIENT GOALS:   To return to full function  NEXT MD VISIT: 05/29/23  OBJECTIVE:      SENSATION: Mild numbness into the hand   POSTURE:  Edema: significant edema into the hand.   UPPER EXTREMITY ROM:   Passive ROM Right eval Left eval Right 8/27 Right 04/22/23 Right 9/20  Shoulder flexion       Shoulder extension       Shoulder abduction       Shoulder adduction       Shoulder internal rotation       Shoulder external rotation       Elbow flexion 90 degrees  121 degrees 135   Elbow extension 30 degrees without coming to end feel  25 degrees without coming to end feel 4 degrees before end feel 0 degrees  Wrist flexion Within normal limits      Wrist extension Within normal limits      Wrist ulnar deviation       Wrist radial deviation        Wrist pronation       Wrist supination       (Blank rows = not tested)  UPPER EXTREMITY MMT:  MMT Right eval Left eval  Shoulder flexion    Shoulder extension    Shoulder abduction    Shoulder adduction    Shoulder internal rotation    Shoulder external rotation    Middle trapezius    Lower trapezius    Elbow flexion    Elbow extension    Wrist flexion    Wrist extension    Wrist ulnar deviation    Wrist radial deviation    Wrist pronation    Wrist supination    Grip strength (lbs)    (Blank rows = not tested) not tested secondary to recent surgery    PALPATION:  No unexpected tenderness to palpation   TODAY'S TREATMENT:                                                                                                                                         [facility] Adult PT Treatment:  DATE: 06/04/2023 Therapeutic Exercise: UBE L2 x fwd/2 min bkwd Ball on wall CW/CCW 2x30" --> alphabet Overhead quick ball toss (stability/perturbations) + orange PB 2x1 min Flexion 6#DB + with opp isometric hold 2 x 10 Scaption 4#DB + with opp isometric hold 2 x 10 Bicep curl 4#DB + with opp 90 degree hold 2 x 10 Serratus wall slide RTB x 20 + foam roller Forearm plank on low table --> added fwd/bkwd shifts for shoulder stability --> alt hand fwd taps Lifting 10#KB & 15#KB from floor to table   [facility] Adult PT Treatment:                                                DATE: 05/26/23 Therapeutic Exercise: UBE L0 x 4 min alt fwd/bkwd Ball on wall CW/CCW 2 x 30 sec Flexion 5# with opp isometric hold 2 x 10 Scaption 5# with opp isometric hold 2 x 10 Bicep curl 5# with opp 90 degree hold 2 x 10 Serratus wall slide red TB x 20 Forearm plank elevated table 3 x 30 sec Prone Y x 20, T 1# x 20, I 3# x 20 Wrist flexion 3# x30 Wrist ext 3# x30 Radial deviation 3# x30 Wrist supination/pronation 3# x 30 90/90 hold 3# x 2 laps   [facility] Adult PT  Treatment:                                                DATE: 05/19/2023 Therapeutic Exercise: UBE L0 x fwd/2 min bkwd Bent elbow plank on hi table 3x30" Ball on wall circles CW/CCW x10 each way --> ABCs Prone:  I 1#DB x20, x5 2#DB    T1# x20 Y 0# x 20 Serratus wall slides 2x10 Standing scap squeezes with noodle 10x5" Isometric neutral row step bkw/fwd + GTB 2x10 Corner pec stretch 2x30" Wrist flexion 3# x30 Wrist ext 3# x30 Radial deviation 3# x30 Wrist supination/pronation 3# x 30 90/90 hold 3# x 2 laps   PATIENT EDUCATION: Education details: Updated HEP Person educated: Patient Education method: Explanation, Demonstration, Tactile cues, Verbal cues, and Handouts Education comprehension: verbalized understanding, returned demonstration, verbal cues required, tactile cues required, and needs further education  HOME EXERCISE PROGRAM: Access Code: [email or web address] URL: [email or web address] Date: 06/04/2023 Prepared by: Carlynn Herald  Exercises - Single Arm Doorway Pec Stretch at 60 Elevation  - 1 x daily - 7 x weekly - 1 sets - 10 reps - 10 seconds hold - Prone Single Arm Shoulder Y  - 1 x daily - 7 x weekly - 3 sets - 10 reps - Prone Shoulder Horizontal Abduction  - 1 x daily - 7 x weekly - 3 sets - 10 reps - Prone Shoulder Extension - Single Arm  - 1 x daily - 7 x weekly - 3 sets - 10 reps - Corner Pec Major Stretch  - 1 x daily - 7 x weekly - 1 sets - 3-5 reps - 30 sec hold - Standing Shoulder Flexion to 90 Degrees with Dumbbells  - 1 x daily - 7 x weekly - 3 sets - 10 reps - Standing Shoulder Flexion with Dumbbells  - 1 x daily - 7 x weekly - 3 sets -  10 reps - Scaption with Dumbbells  - 1 x daily - 7 x weekly - 3 sets - 10 reps - Standing Bicep Curls with Resistance  - 1 x daily - 7 x weekly - 3 sets - 10 reps - Standing Bent Over Triceps Extension  - 1 x daily - 7 x weekly - 3 sets - 10 reps  ASSESSMENT:  CLINICAL IMPRESSION: Session focused on  shoulder stabilization and endurance; moderate fatigue in R shoulder due to low endurance and weakness with overhead isometric exercises. Patient able to lift up to 15 pounds from floor to table with no exacerbation of pain; cueing provided to improve squat lifting mechanics. Shoulder strengthening progressed with combination of isometric and dynamic exercise.    OBJECTIVE IMPAIRMENTS: decreased ROM, decreased strength, increased edema, impaired UE functional use, and pain.   GOALS: Goals reviewed with patient? Yes  SHORT TERM GOALS: Target date: 05/04/2023  Patient will demonstrate full elbow extension on the right side Baseline: Goal status: MET  2.  Patient will demonstrate full elbow flexion on the right side Baseline:  Goal status: MET  3.  Patient will be independent with basic HEP to begin strengthening program for work Baseline:  Goal status: MET  4.  Patient will show no signs of edema in his right hand Baseline:  Goal status: MET   LONG TERM GOALS: Target date: 06/15/2023  Patient will lift 10 to 15 pound box without increased biceps pain when cleared by MD in order to return to work Baseline:  Goal status: MET  2.  Patient will demonstrate full right elbow and shoulder strength in order to perform ADLs Baseline:  Goal status: MET  3.  Patient will perform all hygiene and daily functional tasks with right upper extremity without increased pain Baseline:  Goal status: MET  PLAN: PT FREQUENCY: 1-2x/week  PT DURATION: 12 weeks  PLANNED INTERVENTIONS:  Therapeutic exercises, Therapeutic activity, Neuromuscular re-education,  Patient/Family education, Self Care, Joint mobilization, Stair training, DME instructions, Aquatic Therapy, Dry Needling, Electrical stimulation, Cryotherapy, Moist heat, Taping, Manual therapy, and Re-evaluation.   PLAN FOR NEXT SESSION: Discharge PHYSICAL THERAPY DISCHARGE SUMMARY  Visits from Start of Care: 14  Current functional  level related to goals / functional outcomes: Improved strength and ROM, improved activity tolerance   Remaining deficits: See above   Education / Equipment: HEP   Patient agrees to discharge. Patient goals were met. Patient is being discharged due to being pleased with the current functional level.  Reggy Eye, PT,DPT10/25/248:39 AM  Sanjuana Mae, PTA 06/04/2023, 10:55 AM

## 2023-06-10 ENCOUNTER — Encounter (HOSPITAL_BASED_OUTPATIENT_CLINIC_OR_DEPARTMENT_OTHER): Payer: Self-pay | Admitting: Orthopaedic Surgery

## 2023-06-10 ENCOUNTER — Telehealth (HOSPITAL_BASED_OUTPATIENT_CLINIC_OR_DEPARTMENT_OTHER): Payer: Self-pay | Admitting: Orthopaedic Surgery

## 2023-06-10 NOTE — Telephone Encounter (Signed)
Desire from [employer] [id number] (p)  claim number [id number] for patient needs to a note saying when the date is that  patient is returning to work.

## 2023-06-11 NOTE — Telephone Encounter (Signed)
Note faxed to [facility]

## 2023-06-12 ENCOUNTER — Ambulatory Visit (HOSPITAL_BASED_OUTPATIENT_CLINIC_OR_DEPARTMENT_OTHER): Payer: BC Managed Care – PPO | Admitting: Orthopaedic Surgery

## 2023-08-18 ENCOUNTER — Encounter: Payer: Self-pay | Admitting: Family Medicine

## 2023-08-19 ENCOUNTER — Telehealth (INDEPENDENT_AMBULATORY_CARE_PROVIDER_SITE_OTHER): Payer: BC Managed Care – PPO | Admitting: Family Medicine

## 2023-08-19 ENCOUNTER — Encounter: Payer: Self-pay | Admitting: Family Medicine

## 2023-08-19 VITALS — Ht 70.0 in | Wt 230.0 lb

## 2023-08-19 DIAGNOSIS — Z8673 Personal history of transient ischemic attack (TIA), and cerebral infarction without residual deficits: Secondary | ICD-10-CM | POA: Diagnosis not present

## 2023-08-19 DIAGNOSIS — E785 Hyperlipidemia, unspecified: Secondary | ICD-10-CM | POA: Diagnosis not present

## 2023-08-19 DIAGNOSIS — F3341 Major depressive disorder, recurrent, in partial remission: Secondary | ICD-10-CM

## 2023-08-19 DIAGNOSIS — I10 Essential (primary) hypertension: Secondary | ICD-10-CM

## 2023-08-19 MED ORDER — CITALOPRAM HYDROBROMIDE 20 MG PO TABS: 30.0000 mg | ORAL_TABLET | Freq: Every day | ORAL | 3 refills | Status: DC

## 2023-08-19 MED ORDER — ROSUVASTATIN CALCIUM 40 MG PO TABS: 40.0000 mg | ORAL_TABLET | Freq: Every day | ORAL | 3 refills | Status: AC

## 2023-08-19 NOTE — Progress Notes (Signed)
 Phone [phone] Virtual visit via Video note   Subjective:  Chief complaint: Chief Complaint  Patient presents with   discuss meds    Pt is wanting to discuss citlapr    Our team/I connected with Helene LELON Finder at  8:20 AM EST by a video enabled telemedicine application (caregility through epic) and verified that I am speaking with the correct person using two identifiers. Our team/I discussed the limitations of evaluation and management by telemedicine and the availability of in person appointments.No physical exam was performed (except for noted visual exam or audio findings with Telehealth visits).   Location patient: [place] Location provider: [facility], office Persons participating in the virtual visit:  patient  Past Medical History-  Patient Active Problem List   Diagnosis Date Noted   History of stroke 11/07/2019    Priority: High   Fatty liver 10/31/2021    Priority: Medium    History of adenomatous polyp of colon 03/19/2020    Priority: Medium    Hyperlipidemia 07/21/2016    Priority: Medium    GERD 09/19/2009    Priority: Medium    Recurrent major depression in full remission (HCC) 06/21/2009    Priority: Medium    Essential hypertension 02/22/2007    Priority: Medium    Asthma 02/22/2007    Priority: Medium    Ophthalmic migraine 09/20/2010    Priority: Low   DYSPLASTIC NEVUS, BACK 02/05/2008    Priority: Low   Lipoma 01/31/2008    Priority: Low   ERECTILE DYSFUNCTION, MILD 03/17/2007    Priority: Low   Allergic rhinitis 02/22/2007    Priority: Low   Acute medial meniscus tear, left, initial encounter 02/07/2021   Dysarthria 01/03/2020    Medications- reviewed and updated Current Outpatient Medications  Medication Sig Dispense Refill   aspirin  EC 81 MG tablet Take 81 mg by mouth daily. Swallow whole.     lisinopril  (ZESTRIL ) 40 MG tablet TAKE 1 TABLET BY MOUTH DAILY 90 tablet 3   loratadine (CLARITIN) 10 MG tablet Take 10 mg by mouth daily.      omeprazole  (PRILOSEC) 20 MG capsule Take 1 capsule (20 mg total) by mouth daily. 90 capsule 3   sildenafil  (VIAGRA ) 100 MG tablet Take 1 tablet (100 mg total) by mouth daily as needed for erectile dysfunction. 10 tablet 11   citalopram  (CELEXA ) 20 MG tablet Take 1.5 tablets (30 mg total) by mouth daily. 135 tablet 3   rosuvastatin  (CRESTOR ) 40 MG tablet Take 1 tablet (40 mg total) by mouth daily. 90 tablet 3   No current facility-administered medications for this visit.     Objective:  Ht 5' 10 (1.778 m)   Wt 230 lb (104.3 kg)   BMI 33.00 kg/m  self reported vitals Gen: NAD, resting comfortably Lungs: nonlabored, normal respiratory rate  Skin: appears dry, no obvious rash     Assessment and Plan   # Depression S: Medication:Celexa  20Mg  daily - he has noted irritability lately. Holidays were challenging and has 2 grandkids living with him and 1 daughter with legal issues. In last month has noted at times gets mad for no reason- doesn't feel like hruting anyone or himself.     08/19/2023    7:37 AM 12/04/2022    9:05 AM 06/03/2022   10:18 AM  Depression screen PHQ 2/9  Decreased Interest 1 0 0  Down, Depressed, Hopeless 1 1 0  PHQ - 2 Score 2 1 0  Altered sleeping 2 2 0  Tired, decreased energy 3 2 0  Change in appetite 0 2 1  Feeling bad or failure about yourself  0 0 0  Trouble concentrating 1 0 0  Moving slowly or fidgety/restless 0 0 0  Suicidal thoughts 0 0 0  PHQ-9 Score 8 7 1   Difficult doing work/chores Not difficult at all Somewhat difficult Not difficult at all      08/19/2023    7:38 AM 12/04/2022    9:05 AM  GAD 7 : Generalized Anxiety Score  Nervous, Anxious, on Edge 2 1  Control/stop worrying 1 1  Worry too much - different things 1 1  Trouble relaxing 1 1  Restless 1 0  Easily annoyed or irritable 1 1  Afraid - awful might happen 1 0  Total GAD 7 Score 8 5  Anxiety Difficulty Somewhat difficult Not difficult at all   A/P: Depression mildly  worsened-this certainly seems situational with home stressors.  We discussed options and he would like to both increase medicine and pursue therapy. - Increase citalopram  to 30 mg.  Gave option of 40 mg but wants to hold off for now.  No prior QT prolongation but consider updating EKG at follow-up especially if we further increase dose -has employee assistance program that he can start at work- plans to start in next few days   #history of CVA 10/2019 cared for by [facility](dysarthria)- LDL goal under 70 #hyperlipidemia S: Medication: Rosuvastatin  40Mg , asa 81mg - backup thas been off- want him to restart Lab Results  Component Value Date   CHOL 129 06/03/2022   HDL 40.70 06/03/2022   LDLCALC 63 06/03/2022   LDLDIRECT 64 05/11/2020   TRIG 123.0 06/03/2022   CHOLHDL 3 06/03/2022  A/P: With history of stroke patient is compliant with rosuvastatin  and lipids have been well-controlled (we discussed he is due for repeat and possibly can do that at 6-week follow-up)-unfortunately he has stopped his aspirin  and I encouraged him to restart this for stroke prevention-he agrees to do so -We did discuss some increased bleeding risk with aspirin  and citalopram  together.  Some of this may be counteracted by his omeprazole  which she will continue  #hypertension S: medication: Lisinopril  40Mg  Home readings #s: in general has been well controlled at home but doesn't have recent reading A/P: Reports typically well-controlled at home-continue current medication  $right bicep tendon tear requiring repair/surgery- worked with Dr. Genelle on this- tore it after going on rope swing  Recommended follow up: Return in about 6 weeks (around 09/30/2023) for followup or sooner if needed.Schedule b4 you leave. Future Appointments  Date Time Provider Department Center  02/15/2024  1:00 PM Katrinka Garnette KIDD, MD [facility]    Lab/Order associations:   ICD-10-CM   1. Recurrent major depression in partial remission (HCC)   F33.41     2. Essential hypertension  I10     3. Hyperlipidemia, unspecified hyperlipidemia type  E78.5     4. History of stroke  Z86.73       Meds ordered this encounter  Medications   citalopram  (CELEXA ) 20 MG tablet    Sig: Take 1.5 tablets (30 mg total) by mouth daily.    Dispense:  135 tablet    Refill:  3    **Patient requests 90 days supply**   rosuvastatin  (CRESTOR ) 40 MG tablet    Sig: Take 1 tablet (40 mg total) by mouth daily.    Dispense:  90 tablet    Refill:  3  Return precautions advised.  Garnette Lukes, MD

## 2023-08-19 NOTE — Patient Instructions (Addendum)
 Health Maintenance Due  Topic Date Due   Colonoscopy  03/10/2023  [facility] contact Please call to schedule visit and/or procedure Address: [address] [place], [place], [place] [address] Phone: [phone]  Taking the medicine as directed and not missing any doses is one of the best things you can do to treat your depression.  Here are some things to keep in mind:  Side effects (stomach upset, some increased anxiety) may happen before you notice a benefit.  These side effects typically go away over time. Changes to your dose of medicine or a change in medication all together is sometimes necessary Most people need to be on medication at least 6-12 months Many people will notice an improvement within two weeks but the full effect of the medication can take up to 4-6 weeks Stopping the medication when you start feeling better often results in a return of symptoms If you start having thoughts of hurting yourself or others after starting this medicine, call our office immediately at [phone] or seek care through 911.     Recommended follow up: No follow-ups on file.

## 2023-08-23 ENCOUNTER — Other Ambulatory Visit: Payer: Self-pay | Admitting: Family Medicine

## 2023-10-27 DIAGNOSIS — I10 Essential (primary) hypertension: Secondary | ICD-10-CM | POA: Diagnosis not present

## 2023-10-27 DIAGNOSIS — E669 Obesity, unspecified: Secondary | ICD-10-CM | POA: Diagnosis not present

## 2023-10-27 DIAGNOSIS — Z79899 Other long term (current) drug therapy: Secondary | ICD-10-CM | POA: Diagnosis not present

## 2023-10-27 DIAGNOSIS — K219 Gastro-esophageal reflux disease without esophagitis: Secondary | ICD-10-CM | POA: Diagnosis not present

## 2023-10-27 DIAGNOSIS — Z6833 Body mass index (BMI) 33.0-33.9, adult: Secondary | ICD-10-CM | POA: Diagnosis not present

## 2023-10-27 DIAGNOSIS — Z8673 Personal history of transient ischemic attack (TIA), and cerebral infarction without residual deficits: Secondary | ICD-10-CM | POA: Diagnosis not present

## 2023-10-27 DIAGNOSIS — E785 Hyperlipidemia, unspecified: Secondary | ICD-10-CM | POA: Diagnosis not present

## 2023-10-27 DIAGNOSIS — R300 Dysuria: Secondary | ICD-10-CM | POA: Diagnosis not present

## 2023-10-27 DIAGNOSIS — R319 Hematuria, unspecified: Secondary | ICD-10-CM | POA: Diagnosis not present

## 2023-10-27 DIAGNOSIS — J45909 Unspecified asthma, uncomplicated: Secondary | ICD-10-CM | POA: Diagnosis not present

## 2023-10-27 DIAGNOSIS — R309 Painful micturition, unspecified: Secondary | ICD-10-CM | POA: Diagnosis not present

## 2023-10-27 DIAGNOSIS — Z881 Allergy status to other antibiotic agents status: Secondary | ICD-10-CM | POA: Diagnosis not present

## 2023-10-29 ENCOUNTER — Ambulatory Visit: Admitting: Urology

## 2023-10-29 ENCOUNTER — Encounter: Payer: Self-pay | Admitting: Urology

## 2023-10-29 VITALS — BP 119/84 | HR 66 | Ht 70.0 in | Wt 235.0 lb

## 2023-10-29 DIAGNOSIS — R310 Gross hematuria: Secondary | ICD-10-CM

## 2023-10-29 DIAGNOSIS — R399 Unspecified symptoms and signs involving the genitourinary system: Secondary | ICD-10-CM

## 2023-10-29 LAB — URINALYSIS, ROUTINE W REFLEX MICROSCOPIC
Bilirubin, UA: NEGATIVE
Glucose, UA: NEGATIVE
Ketones, UA: NEGATIVE
Leukocytes,UA: NEGATIVE
Nitrite, UA: NEGATIVE
Protein,UA: NEGATIVE
Specific Gravity, UA: 1.025 (ref 1.005–1.030)
Urobilinogen, Ur: 1 mg/dL (ref 0.2–1.0)
pH, UA: 5.5 (ref 5.0–7.5)

## 2023-10-29 LAB — MICROSCOPIC EXAMINATION

## 2023-10-29 NOTE — Progress Notes (Signed)
 Assessment: 1. Lower urinary tract symptoms (LUTS)   2. Gross hematuria      Plan: Diagnosis and evaluation of gross hematuria discussed in detail today with patient Will send voided urine today for cytology Schedule cystoscopy  Chief Complaint: blood in urine  History of Present Illness:  Eduardo Turner is a 63 y.o. male who is seen in consultation from Shelva Majestic, MD for evaluation of gross hematuria.  Patient reports that 2 days ago he had an initial episode of hematuria.  He states that he was at a restaurant and went to the bathroom and noted pain with urination and after he was through urinating passed a small clot and then had some blood staining in his underwear.  On subsequent voiding he also noted hematuria throughout his stream as well as some passage of several small clots.  Since yesterday afternoon he has had no more visible hematuria but has had persistent mild dysuria.  Urinalysis today shows 3-10 RBC/hpf  CT abdomen pelvis with contrast 10/27/23 demonstrated normal appearance of the kidneys ureter and bladder.  No other acute findings  Last psa 05/2022= 0.32  Past Medical History:  Past Medical History:  Diagnosis Date   Allergy    Asthma    Dysplastic nevi    GERD (gastroesophageal reflux disease)    Hypertension    Multiple lipomas    Peptic esophageal ulcer    Stroke (HCC) 10/30/2019    Past Surgical History:  Past Surgical History:  Procedure Laterality Date   COLONOSCOPY     KNEE SURGERY Left    SHOULDER SURGERY     bilateral    Allergies:  Allergies  Allergen Reactions   Erythromycin Ethylsuccinate     REACTION: HIVES    Family History:  Family History  Problem Relation Age of Onset   Other Mother        routine surgery lead to sepsis- hysterectomy   Heart attack Father        age 69   Bipolar disorder Sister    Colon cancer Neg Hx    Esophageal cancer Neg Hx    Rectal cancer Neg Hx    Stomach cancer Neg Hx     Social  History:  Social History   Tobacco Use   Smoking status: Never   Smokeless tobacco: Never  Vaping Use   Vaping status: Never Used  Substance Use Topics   Alcohol use: Yes    Alcohol/week: 0.0 standard drinks of alcohol    Comment: occ   Drug use: No    Review of symptoms:  Constitutional:  Negative for unexplained weight loss, night sweats, fever, chills ENT:  Negative for nose bleeds, sinus pain, painful swallowing CV:  Negative for chest pain, shortness of breath, exercise intolerance, palpitations, loss of consciousness Resp:  Negative for cough, wheezing, shortness of breath GI:  Negative for nausea, vomiting, diarrhea, bloody stools GU:  Positives noted in HPI; otherwise negative for gross hematuria, dysuria, urinary incontinence Neuro:  Negative for seizures, poor balance, limb weakness, slurred speech Psych:  Negative for lack of energy, depression, anxiety Endocrine:  Negative for polydipsia, polyuria, symptoms of hypoglycemia (dizziness, hunger, sweating) Hematologic:  Negative for anemia, purpura, petechia, prolonged or excessive bleeding, use of anticoagulants  Allergic:  Negative for difficulty breathing or choking as a result of exposure to anything; no shellfish allergy; no allergic response (rash/itch) to materials, foods  Physical exam: BP 119/84   Pulse 66   Ht 5\' 10"  (  1.778 m)   Wt 235 lb (106.6 kg)   BMI 33.72 kg/m  GENERAL APPEARANCE:  Well appearing, well developed, well nourished, NAD    Results: UA demonstrates 3-10 RBC/hpf

## 2023-11-04 ENCOUNTER — Ambulatory Visit: Admitting: Urology

## 2023-11-04 ENCOUNTER — Encounter: Payer: Self-pay | Admitting: Urology

## 2023-11-04 VITALS — BP 117/78 | HR 68

## 2023-11-04 DIAGNOSIS — R310 Gross hematuria: Secondary | ICD-10-CM

## 2023-11-04 DIAGNOSIS — Z860100 Personal history of colon polyps, unspecified: Secondary | ICD-10-CM

## 2023-11-04 DIAGNOSIS — R399 Unspecified symptoms and signs involving the genitourinary system: Secondary | ICD-10-CM

## 2023-11-04 LAB — URINALYSIS, ROUTINE W REFLEX MICROSCOPIC
Bilirubin, UA: NEGATIVE
Glucose, UA: NEGATIVE
Ketones, UA: NEGATIVE
Leukocytes,UA: NEGATIVE
Nitrite, UA: NEGATIVE
Protein,UA: NEGATIVE
RBC, UA: NEGATIVE
Specific Gravity, UA: >1.03 — ABNORMAL HIGH (ref 1.005–1.030)
Urobilinogen, Ur: 1 mg/dL (ref 0.2–1.0)
pH, UA: 5.5 (ref 5.0–7.5)

## 2023-11-04 LAB — MICROSCOPIC EXAMINATION: Bacteria, UA: NONE SEEN

## 2023-11-04 MED ORDER — FINASTERIDE 5 MG PO TABS: 5.0000 mg | ORAL_TABLET | Freq: Every day | ORAL | 3 refills | Status: AC

## 2023-11-04 MED ORDER — SULFAMETHOXAZOLE-TRIMETHOPRIM 800-160 MG PO TABS
1.0000 | ORAL_TABLET | Freq: Once | ORAL | Status: AC
Start: 2023-11-04 — End: 2023-11-04
  Administered 2023-11-04: 1 via ORAL

## 2023-11-04 NOTE — Addendum Note (Signed)
 Addended by: Carolin Coy on: 11/04/2023 12:50 PM   Modules accepted: Orders

## 2023-11-04 NOTE — Progress Notes (Signed)
 Assessment: 1. Gross hematuria     Plan: Today I discussed with the patient that his GU evaluation for gross hematuria did not reveal significant findings other than a friable prostatic urothelium which suggest that this is likely the source of his bleeding.  He has also had worsening lower urinary tract symptoms recently as well.  We discussed options for further management.  He would like to begin new medical therapy. Rx: Finasteride 5 mg daily Nature of medication including proper utilization as well as potential adverse events and side effects reviewed.  Patient is due for physical with PSA testing later this summer.  His PSA has fluctuated between very low levels between 0.3 and 2.0 over the last several years. Follow-up 6 months for recheck  Chief Complaint: Blood in urine  HPI: Eduardo Turner is a 63 y.o. male who presents for continued evaluation of gross hematuria. See my note 10/29/2023 at time of initial visit for detailed history. Patient also has had recently worsening LUTS. IPSS=11  Heme eval 10/2023-- CT abdomen pelvis with contrast 10/27/23 demonstrated normal appearance of the kidneys ureter and bladder.  No other acute findings  Urine cytology:  negative Cysto neg except for mild bladder trabulation and bilobar prostatic enlargement with friable prostate urothelium especially near bladder neck.  Portions of the above documentation were copied from a prior visit for review purposes only.  Allergies: Allergies  Allergen Reactions   Erythromycin Ethylsuccinate     REACTION: HIVES    PMH: Past Medical History:  Diagnosis Date   Allergy    Asthma    Dysplastic nevi    GERD (gastroesophageal reflux disease)    Hypertension    Multiple lipomas    Peptic esophageal ulcer    Stroke (HCC) 10/30/2019    PSH: Past Surgical History:  Procedure Laterality Date   COLONOSCOPY     KNEE SURGERY Left    SHOULDER SURGERY     bilateral    SH: Social History    Tobacco Use   Smoking status: Never   Smokeless tobacco: Never  Vaping Use   Vaping status: Never Used  Substance Use Topics   Alcohol use: Yes    Alcohol/week: 0.0 standard drinks of alcohol    Comment: occ   Drug use: No    ROS: Constitutional:  Negative for fever, chills, weight loss CV: Negative for chest pain, previous MI, hypertension Respiratory:  Negative for shortness of breath, wheezing, sleep apnea, frequent cough GI:  Negative for nausea, vomiting, bloody stool, GERD  PE: Vitals:   11/04/23 1006  BP: 117/78  Pulse: 68    GENERAL APPEARANCE:  Well appearing, well developed, well nourished, NAD  GU: Normal external genitalia. DRE: Normal sphincter tone; prostate is approximately 40 g without evidence of nodules or induration    Results: UA today is negative for significant blood or infection   Procedure: Cystoscopy   Indication: Gross hematuria  Description of procedure: The patient was brought to the procedure room where he was correctly identified and the procedure again reviewed with him with informed consent obtained.  The patient was positioned supine and extra genitalia were prepped and draped in the usual fashion.  Flexible cystoscopy was subsequently performed. Anterior urethra appeared normal. Prostatic urethra revealed lateral lobe enlargement with friable prostatic urothelium near the bladder neck with some bullous edema.  No active bleeding.  No median lobe. Bladder was entered and carefully inspected systematically.  Ureteral openings appeared normal.  There were no focal  mucosal lesions appreciated.  There was mild bladder trabeculation. Procedure well-tolerated.  No complications.

## 2023-12-25 ENCOUNTER — Encounter: Payer: Self-pay | Admitting: Pediatrics

## 2023-12-28 ENCOUNTER — Encounter: Payer: Self-pay | Admitting: Pediatrics

## 2023-12-28 ENCOUNTER — Ambulatory Visit (AMBULATORY_SURGERY_CENTER): Admitting: *Deleted

## 2023-12-28 VITALS — Ht 70.0 in | Wt 240.0 lb

## 2023-12-28 DIAGNOSIS — Z860100 Personal history of colon polyps, unspecified: Secondary | ICD-10-CM

## 2023-12-28 MED ORDER — SUTAB 1479-225-188 MG PO TABS: 24.0000 | ORAL_TABLET | Freq: Once | ORAL | 0 refills | Status: AC

## 2023-12-28 NOTE — Progress Notes (Signed)
 Pre visit completed over telephone. Instructions sent through MyChart. Patient to call if insurance doesn't cover SUTAB . Order indicates Sutab  is preferred with his insurance.     No egg or soy allergy known to patient  No issues known to pt with past sedation with any surgeries or procedures Patient denies ever being told they had issues or difficulty with intubation  No FH of Malignant Hyperthermia Pt is not on diet pills Pt is not on  home 02  Pt is not on blood thinners  Pt denies issues with constipation  No A fib or A flutter Have any cardiac testing pending--NO Pt instructed to use [email or web address] or GoodRx for a price reduction on prep

## 2024-01-06 ENCOUNTER — Other Ambulatory Visit: Payer: Self-pay | Admitting: Family Medicine

## 2024-01-11 ENCOUNTER — Telehealth: Payer: Self-pay | Admitting: Pediatrics

## 2024-01-11 NOTE — Telephone Encounter (Signed)
 Inbound call from patient, states Sutabs would be $88 OTC and patient would like a more affordable option sent in

## 2024-01-12 MED ORDER — PEG 3350-KCL-NA BICARB-NACL 420 G PO SOLR: 4000.0000 mL | Freq: Once | ORAL | 0 refills | Status: AC

## 2024-01-12 NOTE — Telephone Encounter (Signed)
 Golytely RX sent to pharmacy listed in chart and a copy of Golytely instructions sent to pt's my chart.

## 2024-01-12 NOTE — Telephone Encounter (Signed)
Attempt to reach pt. LM with call back #  

## 2024-01-14 NOTE — Telephone Encounter (Signed)
 PT is calling in regards to Golytely. He went to pick it up today and it has not been received by the pharmacy.It should be sent to [facility] [address] Johna Myers

## 2024-01-14 NOTE — Telephone Encounter (Signed)
 Called and spoke with pharmacist- verbal order for Golytely given as order had not been received;  Pharmacist reports RX will be ready this afternoon;  Called and spoke with patient- patient advised that RX would be ready for him this afternoon; patient advised to go ahead and pick up prep from pharmacy; patient reports he has been reviewing prep instructions;   Patient verbalized understanding of information/instructions;    Patient advised to call back to the office at [phone] should questions/concerns arise;

## 2024-01-18 NOTE — Progress Notes (Unsigned)
 [facility] History and Physical   Primary Care Physician:  Almira Jaeger, MD   Reason for Procedure:  History of adenomatous colon polyps  Plan:    Surveillance colonoscopy     HPI: Eduardo Turner is a 63 y.o. male undergoing surveillance colonoscopy for history of adenomatous colon polyps.  Patient's last colonoscopy was performed in 2021 demonstrating 3 polyps all of which were tubular adenomas on pathology.  1 polyp was greater than 10 mm.  No documented family history of colorectal cancer or polyps.  Patient denies current change in bowel habits or rectal bleeding.   Past Medical History:  Diagnosis Date   Allergy    Asthma    Dysplastic nevi    GERD (gastroesophageal reflux disease)    Hypertension    Multiple lipomas    Peptic esophageal ulcer    Stroke (HCC) 10/30/2019    Past Surgical History:  Procedure Laterality Date   BICEPS TENDON REPAIR Right    COLONOSCOPY     KNEE SURGERY Left    SHOULDER SURGERY     bilateral    Prior to Admission medications   Medication Sig Start Date End Date Taking? Authorizing Provider  acetaminophen  (TYLENOL ) 325 MG tablet Take 650 mg by mouth.    [provider]  albuterol  (VENTOLIN  HFA) 108 (90 Base) MCG/ACT inhaler Inhale 1-2 puffs into the lungs. 02/09/21   [provider]  aspirin  EC 81 MG tablet Take 81 mg by mouth daily. Swallow whole.    [provider]  citalopram  (CELEXA ) 20 MG tablet TAKE 1 TABLET BY MOUTH EVERY DAY 08/24/23   Almira Jaeger, MD  cyclobenzaprine  (FLEXERIL ) 10 MG tablet Take 10 mg by mouth. 10/23/21   [provider]  finasteride  (PROSCAR ) 5 MG tablet Take 1 tablet (5 mg total) by mouth daily. 11/04/23   Scarlet Curly, MD  lisinopril  (ZESTRIL ) 40 MG tablet TAKE 1 TABLET BY MOUTH DAILY 01/07/24   Almira Jaeger, MD  loratadine (CLARITIN) 10 MG tablet Take 10 mg by mouth daily.    [provider]  omeprazole  (PRILOSEC) 20 MG capsule Take 1  capsule (20 mg total) by mouth daily. 09/13/19   Almira Jaeger, MD  rosuvastatin  (CRESTOR ) 40 MG tablet Take 1 tablet (40 mg total) by mouth daily. 08/19/23   Almira Jaeger, MD  sildenafil  (VIAGRA ) 100 MG tablet Take 1 tablet (100 mg total) by mouth daily as needed for erectile dysfunction. 06/06/21   Almira Jaeger, MD    Current Outpatient Medications  Medication Sig Dispense Refill   citalopram  (CELEXA ) 20 MG tablet TAKE 1 TABLET BY MOUTH EVERY DAY 30 tablet 5   finasteride  (PROSCAR ) 5 MG tablet Take 1 tablet (5 mg total) by mouth daily. 90 tablet 3   lisinopril  (ZESTRIL ) 40 MG tablet TAKE 1 TABLET BY MOUTH DAILY 90 tablet 3   loratadine (CLARITIN) 10 MG tablet Take 10 mg by mouth daily.     omeprazole  (PRILOSEC) 20 MG capsule Take 1 capsule (20 mg total) by mouth daily. 90 capsule 3   rosuvastatin  (CRESTOR ) 40 MG tablet Take 1 tablet (40 mg total) by mouth daily. 90 tablet 3   acetaminophen  (TYLENOL ) 325 MG tablet Take 650 mg by mouth.     albuterol  (VENTOLIN  HFA) 108 (90 Base) MCG/ACT inhaler Inhale 1-2 puffs into the lungs.     aspirin  EC 81 MG tablet Take 81 mg by mouth daily. Swallow whole.     cyclobenzaprine  (  FLEXERIL ) 10 MG tablet Take 10 mg by mouth.     sildenafil  (VIAGRA ) 100 MG tablet Take 1 tablet (100 mg total) by mouth daily as needed for erectile dysfunction. 10 tablet 11   Current Facility-Administered Medications  Medication Dose Route Frequency Provider Last Rate Last Admin   0.9 %  sodium chloride  infusion  500 mL Intravenous Once Naija Troost, Scarlette Currier, MD        Allergies as of 01/20/2024 - Review Complete 01/20/2024  Allergen Reaction Noted   Erythromycin ethylsuccinate Hives 08/28/2006    Family History  Problem Relation Age of Onset   Other Mother        routine surgery lead to sepsis- hysterectomy   Heart attack Father        age 44   Bipolar disorder Sister    Colon cancer Neg Hx    Esophageal cancer Neg Hx    Rectal cancer Neg Hx    Stomach  cancer Neg Hx     Social History   Socioeconomic History   Marital status: Married    Spouse name: Not on file   Number of children: Not on file   Years of education: Not on file   Highest education level: Not on file  Occupational History   Not on file  Tobacco Use   Smoking status: Never   Smokeless tobacco: Never  Vaping Use   Vaping status: Never Used  Substance and Sexual Activity   Alcohol use: Yes    Alcohol/week: 0.0 standard drinks of alcohol    Comment: occ   Drug use: No   Sexual activity: Not on file  Other Topics Concern   Not on file  Social History Narrative   Married. 2 children both out of house. 4 grandkids total (1 in [place] with son, Kaye Parsons- son of Maddy). Granddaughter 85 and grandson 5 in 05/2022- keeping them full time      International aid/development worker at [employer].    Social Drivers of Corporate investment banker Strain: Low Risk  (10/24/2021)   Received from [facility] [address], [facility]   Overall Financial Resource Strain (CARDIA)    Difficulty of Paying Living Expenses: Not hard at all  Food Insecurity: Not on file  Transportation Needs: Not on file  Physical Activity: Not on file  Stress: No Stress Concern Present (10/24/2021)   Received from [facility], [facility]   [employer] of Occupational Health - Occupational Stress Questionnaire    Feeling of Stress : Not at all  Social Connections: Unknown (12/10/2021)   Received from [facility], [facility]   Social Network    Social Network: Not on file  Intimate Partner Violence: Not At Risk (10/27/2023)   Received from [facility]   HITS    Over the last 12 months how often did your partner physically hurt you?: Never    Over the last 12 months how often did your partner insult you or talk down to you?: Never    Over the last 12 months how often did your partner threaten you with physical harm?: Never    Over the last 12 months how often did your partner scream or curse at you?: Never     Review of Systems:  All other review of systems negative except as mentioned in the HPI.  Physical Exam: Vital signs BP (!) 123/91   Pulse (P) 63   Temp 97.9 F (36.6 C) (Temporal)   Resp (P) 16   Ht  5' 10 (1.778 m)   Wt 240 lb (108.9 kg)   SpO2 94%   BMI 34.44 kg/m   General:   Alert,  Well-developed, well-nourished, pleasant and cooperative in NAD Airway:  Mallampati 2 Lungs:  Clear throughout to auscultation.   Heart:  Regular rate and rhythm; no murmurs, clicks, rubs,  or gallops. Abdomen:  Soft, nontender and nondistended. Normal bowel sounds.   Neuro/Psych:  Normal mood and affect. A and O x 3  Eugenia Hess, MD [facility]

## 2024-01-20 ENCOUNTER — Ambulatory Visit (AMBULATORY_SURGERY_CENTER): Admitting: Pediatrics

## 2024-01-20 ENCOUNTER — Encounter: Payer: Self-pay | Admitting: Pediatrics

## 2024-01-20 VITALS — BP 114/76 | HR 57 | Temp 97.9°F | Resp 13 | Ht 70.0 in | Wt 240.0 lb

## 2024-01-20 DIAGNOSIS — D122 Benign neoplasm of ascending colon: Secondary | ICD-10-CM | POA: Diagnosis not present

## 2024-01-20 DIAGNOSIS — K5730 Diverticulosis of large intestine without perforation or abscess without bleeding: Secondary | ICD-10-CM | POA: Diagnosis not present

## 2024-01-20 DIAGNOSIS — Z860100 Personal history of colon polyps, unspecified: Secondary | ICD-10-CM

## 2024-01-20 DIAGNOSIS — Z1211 Encounter for screening for malignant neoplasm of colon: Secondary | ICD-10-CM | POA: Diagnosis not present

## 2024-01-20 MED ORDER — SODIUM CHLORIDE 0.9 % IV SOLN: 500.0000 mL | Freq: Once | INTRAVENOUS | Status: DC

## 2024-01-20 NOTE — Progress Notes (Signed)
 Placed #9 oral airway due to osa obstruction

## 2024-01-20 NOTE — Op Note (Signed)
 [facility] Patient Name: Eduardo Turner Procedure Date: 01/20/2024 7:27 AM MRN: [id number] Endoscopist: Eugenia Hess , MD, [id number] Age: 63 Referring MD:  Date of Birth: 1960/10/24 Gender: Male Account #: [id number] Procedure:                Colonoscopy Indications:              High risk colon cancer surveillance: Personal                            history of adenoma (10 mm or greater in size), High                            risk colon cancer surveillance: Personal history of                            multiple (3 or more) adenomas, Last colonoscopy:                            July 2021 Medicines:                Monitored Anesthesia Care Procedure:                Pre-Anesthesia Assessment:                           - Prior to the procedure, a History and Physical                            was performed, and patient medications and                            allergies were reviewed. The patient's tolerance of                            previous anesthesia was also reviewed. The risks                            and benefits of the procedure and the sedation                            options and risks were discussed with the patient.                            All questions were answered, and informed consent                            was obtained. Prior Anticoagulants: The patient has                            taken no anticoagulant or antiplatelet agents. ASA                            Grade Assessment: II - A patient with mild systemic  disease. After reviewing the risks and benefits,                            the patient was deemed in satisfactory condition to                            undergo the procedure.                           After obtaining informed consent, the colonoscope                            was passed under direct vision. Throughout the                            procedure, the patient's blood pressure, pulse, and                             oxygen saturations were monitored continuously. The                            Colonoscope was introduced through the anus and                            advanced to the cecum, identified by appendiceal                            orifice and ileocecal valve. The colonoscopy was                            technically difficult and complex due to a                            redundant colon, significant looping and a tortuous                            colon. An abdominal binder was prior to the start                            of the procedure. It was removed during the case.                            Successful completion of the procedure was aided by                            changing the patient to a supine position (not                            helpful), using manual pressure and straightening                            and shortening the scope to obtain bowel loop  reduction. Applying manual pressure and utilizing                            the variable stiffener on the colonoscope were most                            helpful. The patient tolerated the procedure. The                            quality of the bowel preparation was good. The                            ileocecal valve, appendiceal orifice, and rectum                            were photographed. Scope In: 7:58:35 AM Scope Out: 8:37:11 AM Scope Withdrawal Time: 0 hours 15 minutes 12 seconds  Total Procedure Duration: 0 hours 38 minutes 36 seconds  Findings:                 The perianal and digital rectal examinations were                            normal. Pertinent negatives include normal                            sphincter tone and no palpable rectal lesions.                           Multiple small-mouthed diverticula were found in                            the sigmoid colon and descending colon.                           Two sessile polyps were found in the  ascending                            colon. The polyps were 4 to 5 mm in size. These                            polyps were removed with a cold snare. Resection                            and retrieval were complete.                           The retroflexed view of the distal rectum and anal                            verge was normal and showed no anal or rectal                            abnormalities. Complications:  No immediate complications. Estimated blood loss:                            Minimal. Estimated Blood Loss:     Estimated blood loss was minimal. Impression:               - Diverticulosis in the sigmoid colon and in the                            descending colon.                           - Two 4 to 5 mm polyps in the ascending colon,                            removed with a cold snare. Resected and retrieved.                           - The distal rectum and anal verge are normal on                            retroflexion view. Recommendation:           - Discharge patient to home (ambulatory).                           - Await pathology results.                           - Repeat colonoscopy for surveillance based on                            pathology results. Consider scheduling extended                            time for next colonoscopy given challenging anatomy.                           - The findings and recommendations were discussed                            with the patient's family.                           - Return to referring physician.                           - Patient has a contact number available for                            emergencies. The signs and symptoms of potential                            delayed complications were discussed with the  patient. Return to normal activities tomorrow.                            Written discharge instructions were provided to the                             patient. Eugenia Hess, MD 01/20/2024 8:44:58 AM This report has been signed electronically.

## 2024-01-20 NOTE — Patient Instructions (Signed)
Discharge instructions given. Handouts on polyps and Diverticulosis. Resume previous medications. YOU HAD AN ENDOSCOPIC PROCEDURE TODAY AT THE [facility]:   Refer to the procedure report that was given to you for any specific questions about what was found during the examination.  If the procedure report does not answer your questions, please call your gastroenterologist to clarify.  If you requested that your care partner not be given the details of your procedure findings, then the procedure report has been included in a sealed envelope for you to review at your convenience later.  YOU SHOULD EXPECT: Some feelings of bloating in the abdomen. Passage of more gas than usual.  Walking can help get rid of the air that was put into your GI tract during the procedure and reduce the bloating. If you had a lower endoscopy (such as a colonoscopy or flexible sigmoidoscopy) you may notice spotting of blood in your stool or on the toilet paper. If you underwent a bowel prep for your procedure, you may not have a normal bowel movement for a few days.  Please Note:  You might notice some irritation and congestion in your nose or some drainage.  This is from the oxygen used during your procedure.  There is no need for concern and it should clear up in a day or so.  SYMPTOMS TO REPORT IMMEDIATELY:  Following lower endoscopy (colonoscopy or flexible sigmoidoscopy):  Excessive amounts of blood in the stool  Significant tenderness or worsening of abdominal pains  Swelling of the abdomen that is new, acute  Fever of 100F or higher  For urgent or emergent issues, a gastroenterologist can be reached at any hour by calling [phone]. Do not use MyChart messaging for urgent concerns.    DIET:  We do recommend a small meal at first, but then you may proceed to your regular diet.  Drink plenty of fluids but you should avoid alcoholic beverages for 24 hours.  ACTIVITY:  You should plan to take it  easy for the rest of today and you should NOT DRIVE or use heavy machinery until tomorrow (because of the sedation medicines used during the test).    FOLLOW UP: Our staff will call the number listed on your records the next business day following your procedure.  We will call around 7:15- 8:00 am to check on you and address any questions or concerns that you may have regarding the information given to you following your procedure. If we do not reach you, we will leave a message.     If any biopsies were taken you will be contacted by phone or by letter within the next 1-3 weeks.  Please call us at [phone] if you have not heard about the biopsies in 3 weeks.    SIGNATURES/CONFIDENTIALITY: You and/or your care partner have signed paperwork which will be entered into your electronic medical record.  These signatures attest to the fact that that the information above on your After Visit Summary has been reviewed and is understood.  Full responsibility of the confidentiality of this discharge information lies with you and/or your care-partner. 

## 2024-01-20 NOTE — Progress Notes (Signed)
 Vss nad trans to pacu

## 2024-01-20 NOTE — Progress Notes (Signed)
 Called to room to assist during endoscopic procedure.  Patient ID and intended procedure confirmed with present staff. Received instructions for my participation in the procedure from the performing physician.

## 2024-01-21 ENCOUNTER — Telehealth: Payer: Self-pay | Admitting: Lactation Services

## 2024-01-21 NOTE — Telephone Encounter (Signed)
 No answer left voice mail

## 2024-01-23 ENCOUNTER — Ambulatory Visit: Payer: Self-pay | Admitting: Pediatrics

## 2024-02-15 ENCOUNTER — Encounter: Payer: Self-pay | Admitting: Family Medicine

## 2024-02-15 ENCOUNTER — Ambulatory Visit (INDEPENDENT_AMBULATORY_CARE_PROVIDER_SITE_OTHER): Payer: BC Managed Care – PPO | Admitting: Family Medicine

## 2024-02-15 VITALS — BP 100/72 | HR 68 | Temp 97.7°F | Ht 70.0 in | Wt 247.2 lb

## 2024-02-15 DIAGNOSIS — E669 Obesity, unspecified: Secondary | ICD-10-CM | POA: Diagnosis not present

## 2024-02-15 DIAGNOSIS — Z125 Encounter for screening for malignant neoplasm of prostate: Secondary | ICD-10-CM

## 2024-02-15 DIAGNOSIS — Z79899 Other long term (current) drug therapy: Secondary | ICD-10-CM

## 2024-02-15 DIAGNOSIS — Z23 Encounter for immunization: Secondary | ICD-10-CM

## 2024-02-15 DIAGNOSIS — E785 Hyperlipidemia, unspecified: Secondary | ICD-10-CM | POA: Diagnosis not present

## 2024-02-15 DIAGNOSIS — Z0000 Encounter for general adult medical examination without abnormal findings: Secondary | ICD-10-CM

## 2024-02-15 DIAGNOSIS — Z131 Encounter for screening for diabetes mellitus: Secondary | ICD-10-CM

## 2024-02-15 MED ORDER — SILDENAFIL CITRATE 100 MG PO TABS: 100.0000 mg | ORAL_TABLET | Freq: Every day | ORAL | 11 refills | Status: AC | PRN

## 2024-02-15 NOTE — Patient Instructions (Addendum)
 Please stop by lab before you go If you have mychart- we will send your results within 3 business days of us  receiving them.  If you do not have mychart- we will call you about results within 5 business days of us  receiving them.  *please also note that you will see labs on mychart as soon as they post. I will later go in and write notes on them- will say notes from Dr. Katrinka Atlas get back on the weight loss journey and goal exercise 150 minutes a week.   Final shingrix  today  Recommended follow up: Return in about 6 months (around 08/17/2024) for followup or sooner if needed.Schedule b4 you leave.

## 2024-02-15 NOTE — Progress Notes (Signed)
 Phone: [phone]   Subjective:  Patient presents today for their annual physical. Chief complaint-noted.   See problem oriented charting- ROS- full  review of systems was completed and negative  Per full ROS sheet completed by patient except for topics noted under acute/chronic concerns   The following were reviewed and entered/updated in epic: Past Medical History:  Diagnosis Date   Allergy    Asthma    Dysplastic nevi    GERD (gastroesophageal reflux disease)    Hypertension    Multiple lipomas    Peptic esophageal ulcer    Stroke (HCC) 10/30/2019   Patient Active Problem List   Diagnosis Date Noted   History of stroke 11/07/2019    Priority: High   Fatty liver 10/31/2021    Priority: Medium    History of adenomatous polyp of colon 03/19/2020    Priority: Medium    Hyperlipidemia 07/21/2016    Priority: Medium    GERD 09/19/2009    Priority: Medium    Recurrent major depression in full remission (HCC) 06/21/2009    Priority: Medium    Essential hypertension 02/22/2007    Priority: Medium    Asthma 02/22/2007    Priority: Medium    Ophthalmic migraine 09/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   Dysarthria 01/03/2020   Past Surgical History:  Procedure Laterality Date   BICEPS TENDON REPAIR Right    COLONOSCOPY     KNEE SURGERY Left    SHOULDER SURGERY     bilateral    Family History  Problem Relation Age of Onset   Other Mother        routine surgery lead to sepsis- hysterectomy   Heart attack Father        age 66   Bipolar disorder Sister    Colon cancer Neg Hx    Esophageal cancer Neg Hx    Rectal cancer Neg Hx    Stomach cancer Neg Hx     Medications- reviewed and  updated Current Outpatient Medications  Medication Sig Dispense Refill   acetaminophen  (TYLENOL ) 325 MG tablet Take 650 mg by mouth.     albuterol  (VENTOLIN  HFA) 108 (90 Base) MCG/ACT inhaler Inhale 1-2 puffs into the lungs.     aspirin  EC 81 MG tablet Take 81 mg by mouth daily. Swallow whole.     citalopram  (CELEXA ) 20 MG tablet TAKE 1 TABLET BY MOUTH EVERY DAY 30 tablet 5   cyclobenzaprine  (FLEXERIL ) 10 MG tablet Take 10 mg by mouth.     finasteride  (PROSCAR ) 5 MG tablet Take 1 tablet (5 mg total) by mouth daily. 90 tablet 3   lisinopril  (ZESTRIL ) 40 MG tablet TAKE 1 TABLET BY MOUTH DAILY 90 tablet 3   loratadine (CLARITIN) 10 MG tablet Take 10 mg by mouth daily.     omeprazole  (PRILOSEC) 20 MG capsule Take 1 capsule (20 mg total) by mouth daily. 90 capsule 3   rosuvastatin  (CRESTOR ) 40 MG tablet Take 1 tablet (40 mg total) by mouth daily. 90 tablet 3   sildenafil  (VIAGRA ) 100 MG tablet Take 1 tablet (100 mg total) by mouth daily as needed for erectile dysfunction. 10 tablet 11   No  current facility-administered medications for this visit.    Allergies-reviewed and updated Allergies  Allergen Reactions   Erythromycin Ethylsuccinate Hives    REACTION: HIVES    Social History   Social History Narrative   Married. 2 children both out of house. 4 grandkids total (1 in [place] with son, Stuart- son of Maddy). Granddaughter 39 and grandson 5 in 05/2022- keeping them full time      International aid/development worker at [employer].    Objective  Objective:  BP 100/72   Pulse 68   Temp 97.7 F (36.5 C)   Ht 5' 10 (1.778 m)   Wt 247 lb 3.2 oz (112.1 kg)   SpO2 95%   BMI 35.47 kg/m  Gen: NAD, resting comfortably HEENT: Mucous membranes are moist. Oropharynx normal Neck: no thyromegaly CV: RRR no murmurs rubs or gallops Lungs: CTAB no crackles, wheeze, rhonchi Abdomen: soft/nontender/nondistended/normal bowel sounds. No rebound or guarding.  Ext: no edema Skin: warm, dry Neuro: grossly normal,  moves all extremities, PERRLA   Assessment and Plan  63 y.o. male presenting for annual physical.  Health Maintenance counseling: 1. Anticipatory guidance: Patient counseled regarding regular dental exams -q6 months, eye exams -yearly,  avoiding smoking and second hand smoke , limiting alcohol to 2 beverages per day - 2 a week, no illicit drugs .   2. Risk factor reduction:  Advised patient of need for regular exercise and diet rich and fruits and vegetables to reduce risk of heart attack and stroke.  Exercise- feels could improve- goal 150 minutes a week for cardiovascular health. Bowls some. Golfs some. Active with grandkids. .  Diet/weight management-feels cruise with [employer] for 10 days upped the weight- plans to reverse this now- plans to get back on app that was helpful last year.  Wt Readings from Last 3 Encounters:  02/15/24 247 lb 3.2 oz (112.1 kg)  01/20/24 240 lb (108.9 kg)  12/28/23 240 lb (108.9 kg)  3. Immunizations/screenings/ancillary studies- Prevnar 20 discussed. COVID opts out.  Immunization History  Administered Date(s) Administered   Influenza Whole 05/08/2008, 06/21/2009   Influenza,inj,Quad PF,6+ Mos 05/25/2018, 06/01/2019, 05/11/2020, 05/31/2021, 06/03/2022   Influenza-Unspecified 06/11/2016   PFIZER(Purple Top)SARS-COV-2 Vaccination 12/02/2019, 12/26/2019   Td 08/11/2002   Tdap 10/16/2014   Zoster Recombinant(Shingrix ) 12/04/2022, 02/15/2024  4. Prostate cancer screening-  PSA may be lower as now on finasteride - update levels- check today Lab Results  Component Value Date   PSA 0.32 06/03/2022   PSA 0.28 11/19/2021   PSA 2.39 05/31/2021   5. Colon cancer screening - tubular adenoma June 2025 with 5-year repeat with Dr. Suzann finn gastroenterology  6. Skin cancer screening- no dermatology advised regular sunscreen use. Denies worrisome, changing, or new skin lesions.  7. Smoking associated screening (lung cancer screening, AAA screen 65-75, UA)-  never smoker 8. STD screening - only active with wife    Status of chronic or acute concerns   #history of CVA 10/2019 cared for by [facility](dysarthria)- LDL goal under 70 #hyperlipidemia S: Medication: Rosuvastatin  40Mg , asa 81mg  Lab Results  Component Value Date   CHOL 129 06/03/2022   HDL 40.70 06/03/2022   LDLCALC 63 06/03/2022   LDLDIRECT 64 05/11/2020   TRIG 123.0 06/03/2022   CHOLHDL 3 06/03/2022  A/P: hopefully stable- update lipid panel today. Continue current meds for now  -continue aspirin  to reduce stroke risk  # Depression S: Medication:Celexa  20Mg --> 30mg   - didn't end up needing therapy    02/15/2024   12:43 PM 08/19/2023  7:37 AM 12/04/2022    9:05 AM  Depression screen PHQ 2/9  Decreased Interest 0 1 0  Down, Depressed, Hopeless 0 1 1  PHQ - 2 Score 0 2 1  Altered sleeping 0 2 2  Tired, decreased energy 0 3 2  Change in appetite 0 0 2  Feeling bad or failure about yourself  0 0 0  Trouble concentrating 0 1 0  Moving slowly or fidgety/restless 0 0 0  Suicidal thoughts 0 0 0  PHQ-9 Score 0 8 7  Difficult doing work/chores Not difficult at all Not difficult at all Somewhat difficult  A/P: doing well- wants to remain on current dose for now- can readdress next visit  # Asthma S: Maintenance Medication: None As needed medication: Albuterol . Patient is using this almost never  A/P: controlled mainly without treatment(s) continue to monitor    # GERD with small hiatal hernia discovered 10/24/21 [facility]:Medication: Prilosec 20Mg  down from 40 mg. -failed Trial of h2 bloc:ker 05/11/20 B12 levels related to PPI use: normal feb 2021 A/P: still doing reasonably well- continue current medications   - intermittent B12 OTC (available over the counter without a prescription)- will check levels  #hypertension S: medication: Lisinopril  40Mg  BP Readings from Last 3 Encounters:  02/15/24 100/72  01/20/24 114/76  11/04/23 117/78  A/P: well controlled continue  current medications - usually 120s over 70s or 80s  #BPH- on finasteride  through urology DrRONITA Sharyl Hurst. Urinary symptom(s) improved - cystoscopy for gross hematuria reassuring and CT abdomen pelvis 10/27/23 reassuring   #Fatty liver-incidentally discovered during cellulitis/sepsis work-up-will update LFTs- bilirubin may be baseline slightly high Lab Results  Component Value Date   ALT 22 06/03/2022   AST 21 06/03/2022   ALKPHOS 51 06/03/2022   BILITOT 2.2 (H) 06/03/2022   Recommended follow up: Return in about 6 months (around 08/17/2024) for followup or sooner if needed.Schedule b4 you leave. Future Appointments  Date Time Provider Department Center  05/09/2024  9:00 AM Stoneking, Adine PARAS., MD [facility] None   Lab/Order associations: fasting   ICD-10-CM   1. Preventative health care  Z00.00     2. Need for shingles vaccine  Z23 Zoster Recombinant (Shingrix  )    3. High risk medication use  Z79.899     4. Screening for diabetes mellitus  Z13.1     5. Obesity (BMI 30-39.9)  E66.9     6. Screening for prostate cancer  Z12.5     7. Hyperlipidemia, unspecified hyperlipidemia type  E78.5       No orders of the defined types were placed in this encounter.   Return precautions advised.  Garnette Lukes, MD

## 2024-02-16 ENCOUNTER — Ambulatory Visit: Payer: Self-pay | Admitting: Family Medicine

## 2024-02-16 LAB — COMPREHENSIVE METABOLIC PANEL WITH GFR
ALT: 19 U/L (ref 0–53)
AST: 20 U/L (ref 0–37)
Albumin: 4.6 g/dL (ref 3.5–5.2)
Alkaline Phosphatase: 44 U/L (ref 39–117)
BUN: 18 mg/dL (ref 6–23)
CO2: 26 meq/L (ref 19–32)
Calcium: 9.2 mg/dL (ref 8.4–10.5)
Chloride: 103 meq/L (ref 96–112)
Creatinine, Ser: 0.74 mg/dL (ref 0.40–1.50)
GFR: 97.75 mL/min (ref >60.00)
Glucose, Bld: 78 mg/dL (ref 70–99)
Potassium: 3.7 meq/L (ref 3.5–5.1)
Sodium: 139 meq/L (ref 135–145)
Total Bilirubin: 1.5 mg/dL — ABNORMAL HIGH (ref 0.2–1.2)
Total Protein: 6.9 g/dL (ref 6.0–8.3)

## 2024-02-16 LAB — CBC WITH DIFFERENTIAL/PLATELET
Basophils Absolute: 0 K/uL (ref 0.0–0.1)
Basophils Relative: 0.6 % (ref 0.0–3.0)
Eosinophils Absolute: 0.2 K/uL (ref 0.0–0.7)
Eosinophils Relative: 2.1 % (ref 0.0–5.0)
HCT: 43 % (ref 39.0–52.0)
Hemoglobin: 14.5 g/dL (ref 13.0–17.0)
Lymphocytes Relative: 20.9 % (ref 12.0–46.0)
Lymphs Abs: 1.6 K/uL (ref 0.7–4.0)
MCHC: 33.8 g/dL (ref 30.0–36.0)
MCV: 86.5 fl (ref 78.0–100.0)
Monocytes Absolute: 0.4 K/uL (ref 0.1–1.0)
Monocytes Relative: 5 % (ref 3.0–12.0)
Neutro Abs: 5.5 K/uL (ref 1.4–7.7)
Neutrophils Relative %: 71.4 % (ref 43.0–77.0)
Platelets: 202 K/uL (ref 150.0–400.0)
RBC: 4.97 Mil/uL (ref 4.22–5.81)
RDW: 13.3 % (ref 11.5–15.5)
WBC: 7.7 K/uL (ref 4.0–10.5)

## 2024-02-16 LAB — LIPID PANEL
Cholesterol: 146 mg/dL (ref 0–200)
HDL: 43.4 mg/dL (ref >39.00)
LDL Cholesterol: 65 mg/dL (ref 0–99)
NonHDL: 102.44
Total CHOL/HDL Ratio: 3
Triglycerides: 189 mg/dL — ABNORMAL HIGH (ref 0.0–149.0)
VLDL: 37.8 mg/dL (ref 0.0–40.0)

## 2024-02-16 LAB — HEMOGLOBIN A1C: Hgb A1c MFr Bld: 5.9 % (ref 4.6–6.5)

## 2024-02-16 LAB — VITAMIN B12: Vitamin B-12: 368 pg/mL (ref 211–911)

## 2024-02-16 LAB — PSA: PSA: 0.15 ng/mL (ref 0.10–4.00)

## 2024-05-09 ENCOUNTER — Ambulatory Visit: Admitting: Urology

## 2024-07-28 ENCOUNTER — Ambulatory Visit: Payer: Self-pay

## 2024-07-28 NOTE — Telephone Encounter (Signed)
 FYI Only or Action Required?: FYI only for provider: appointment scheduled on 07/29/2024.  Patient was last seen in primary care on 02/15/2024 by Katrinka Garnette KIDD, MD.  Called Nurse Triage reporting Anxiety.  Symptoms began a week ago.  Interventions attempted: Nothing.  Symptoms are: gradually worsening.  Triage Disposition: See Physician Within 24 Hours  Patient/caregiver understands and will follow disposition?: Yes   Copied from CRM #[id number]. Topic: Clinical - Red Word Triage >> Jul 28, 2024  8:59 AM Deaijah H wrote: Red Word that prompted transfer to Nurse Triage: citalopram  (CELEXA ) 20 MG tablet not doing was it supposed to. Causing depression/angry. Reason for Disposition  Patient sounds very upset or troubled to the triager  Answer Assessment - Initial Assessment Questions 1. CONCERN: Did anything happen that prompted you to call today?      Increased / more stress  2. ANXIETY SYMPTOMS: Can you describe how you (your loved one; patient) have been feeling? (e.g., tense, restless, panicky, anxious, keyed up, overwhelmed, sense of impending doom).      Overwhelmed, anxious, melt down 3. ONSET: How long have you been feeling this way? (e.g., hours, days, weeks)     Month and worsening last week 4. SEVERITY: How would you rate the level of anxiety? (e.g., 0 - 10; or mild, moderate, severe).     moderate 5. FUNCTIONAL IMPAIRMENT: How have these feelings affected your ability to do daily activities? Have you had more difficulty than usual doing your normal daily activities? (e.g., getting better, same, worse; self-care, school, work, interactions)     yes 6. HISTORY: Have you felt this way before? Have you ever been diagnosed with an anxiety problem in the past? (e.g., generalized anxiety disorder, panic attacks, PTSD). If Yes, ask: How was this problem treated? (e.g., medicines, counseling, etc.)     anxiety 7. RISK OF HARM - SUICIDAL IDEATION: Do you ever have  thoughts of hurting or killing yourself? If Yes, ask:  Do you have these feelings now? Do you have a plan on how you would do this?     no 8. TREATMENT:  What has been done so far to treat this anxiety? (e.g., medicines, relaxation strategies). What has helped?     nothing 9. THERAPIST: Do you have a counselor or therapist? If Yes, ask: What is their name?     no 10. POTENTIAL TRIGGERS: Do you drink caffeinated beverages (e.g., coffee, colas, teas), and how much daily? Do you drink alcohol or use any drugs? Have you started any new medicines recently?       no 11. PATIENT SUPPORT: Who is with you now? Who do you live with? Do you have family or friends who you can talk to?        yes 12. OTHER SYMPTOMS: Do you have any other symptoms? (e.g., feeling depressed, trouble concentrating, trouble sleeping, trouble breathing, palpitations or fast heartbeat, chest pain, sweating, nausea, or diarrhea)       na 13. PREGNANCY: Is there any chance you are pregnant? When was your last menstrual period?       na  Depressed at times, short tempered, pt is usually the peace keeper  Loves to bowl, went boweling and did not enjoy it, snapping on people  Protocols used: Anxiety and Panic Attack-A-AH

## 2024-07-28 NOTE — Telephone Encounter (Signed)
 Scheduled to see Corean Comment 07/29/2024. Dr. Katrinka will review triage notes.

## 2024-07-29 ENCOUNTER — Encounter: Payer: Self-pay | Admitting: Family

## 2024-07-29 ENCOUNTER — Ambulatory Visit: Admitting: Family

## 2024-07-29 VITALS — BP 130/86 | HR 93 | Temp 97.9°F | Ht 70.0 in | Wt 245.8 lb

## 2024-07-29 DIAGNOSIS — F3342 Major depressive disorder, recurrent, in full remission: Secondary | ICD-10-CM

## 2024-07-29 MED ORDER — CITALOPRAM HYDROBROMIDE 40 MG PO TABS: 20.0000 mg | ORAL_TABLET | Freq: Every day | ORAL | 5 refills | Status: DC

## 2024-07-29 NOTE — Progress Notes (Unsigned)
 "  Patient ID: Eduardo Turner, male    DOB: 07/28/1961, 63 y.o.   MRN: [id number]  Chief Complaint  Patient presents with   Anxiety    Pt c/o Anxiety and mood swings and worsening.    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     albuterol  (VENTOLIN  HFA) 108 (90 Base) MCG/ACT inhaler Inhale 1-2 puffs into the lungs.     aspirin  EC 81 MG tablet Take 81 mg by mouth daily. Swallow whole.     citalopram  (CELEXA ) 20 MG tablet TAKE 1 TABLET BY MOUTH EVERY DAY 30 tablet 5   cyclobenzaprine  (FLEXERIL ) 10 MG tablet Take 10 mg by mouth.     finasteride  (PROSCAR ) 5 MG tablet Take 1 tablet (5 mg total) by mouth daily. 90 tablet 3   lisinopril  (ZESTRIL ) 40 MG tablet TAKE 1 TABLET BY MOUTH DAILY 90 tablet 3   loratadine (CLARITIN) 10 MG tablet Take 10 mg by mouth daily.     omeprazole  (PRILOSEC) 20 MG capsule Take 1 capsule (20 mg total) by mouth daily. 90 capsule 3   rosuvastatin  (CRESTOR ) 40 MG tablet Take 1 tablet (40 mg total) by mouth daily. 90 tablet 3   sildenafil  (VIAGRA ) 100 MG tablet Take 1 tablet (100 mg total) by mouth daily as needed for erectile dysfunction. 10 tablet 11   No facility-administered medications prior to visit.   Past Medical History:  Diagnosis Date   Allergy    Asthma    Dysplastic nevi    GERD (gastroesophageal reflux disease)    Hypertension    Multiple lipomas    Peptic esophageal ulcer    Stroke (HCC) 10/30/2019   Past Surgical History:  Procedure Laterality Date   BICEPS TENDON REPAIR Right    COLONOSCOPY     KNEE SURGERY Left    SHOULDER SURGERY     bilateral   Allergies[1]    Objective:     Physical Exam Vitals and nursing note reviewed.  Constitutional:      General: He is not in acute distress.    Appearance: Normal appearance.  HENT:     Head: Normocephalic.  Cardiovascular:     Rate and Rhythm: Normal rate and regular rhythm.  Pulmonary:     Effort: Pulmonary effort is normal.     Breath sounds: Normal breath sounds.  Musculoskeletal:        General: Normal range of motion.     Cervical back: Normal range of motion.  Skin:    General: Skin is warm and dry.  Neurological:     Mental Status: He is alert and oriented to person, place, and time.  Psychiatric:        Mood and Affect: Mood normal.    BP 130/86 (BP Location: Left Arm, Patient Position: Sitting, Cuff Size: Large)   Pulse 93   Temp 97.9 F (36.6 C) (Temporal)   Ht 5' 10 (1.778 m)   Wt 245 lb 12.8 oz (111.5 kg)   SpO2 99%   BMI 35.27 kg/m  Wt Readings from Last 3 Encounters:  07/29/24 245 lb 12.8 oz (  111.5 kg)  02/15/24 247 lb 3.2 oz (112.1 kg)  01/20/24 240 lb (108.9 kg)       Abbegayle Denault, NP       [1] Allergies Allergen Reactions   Erythromycin Ethylsuccinate Hives    REACTION: HIVES  "

## 2024-08-18 ENCOUNTER — Ambulatory Visit: Payer: Self-pay | Admitting: Family Medicine

## 2024-08-18 ENCOUNTER — Encounter: Payer: Self-pay | Admitting: Family Medicine

## 2024-08-18 ENCOUNTER — Ambulatory Visit: Admitting: Family Medicine

## 2024-08-18 VITALS — BP 112/78 | HR 66 | Temp 98.2°F | Ht 70.0 in | Wt 245.8 lb

## 2024-08-18 DIAGNOSIS — Z131 Encounter for screening for diabetes mellitus: Secondary | ICD-10-CM

## 2024-08-18 DIAGNOSIS — E785 Hyperlipidemia, unspecified: Secondary | ICD-10-CM

## 2024-08-18 DIAGNOSIS — I10 Essential (primary) hypertension: Secondary | ICD-10-CM

## 2024-08-18 DIAGNOSIS — F3342 Major depressive disorder, recurrent, in full remission: Secondary | ICD-10-CM | POA: Diagnosis not present

## 2024-08-18 DIAGNOSIS — E669 Obesity, unspecified: Secondary | ICD-10-CM | POA: Diagnosis not present

## 2024-08-18 DIAGNOSIS — Z23 Encounter for immunization: Secondary | ICD-10-CM | POA: Diagnosis not present

## 2024-08-18 DIAGNOSIS — Z79899 Other long term (current) drug therapy: Secondary | ICD-10-CM

## 2024-08-18 DIAGNOSIS — K760 Fatty (change of) liver, not elsewhere classified: Secondary | ICD-10-CM | POA: Diagnosis not present

## 2024-08-18 LAB — CBC WITH DIFFERENTIAL/PLATELET
Basophils Absolute: 0 K/uL (ref 0.0–0.1)
Basophils Relative: 0.5 % (ref 0.0–3.0)
Eosinophils Absolute: 0.1 K/uL (ref 0.0–0.7)
Eosinophils Relative: 1.7 % (ref 0.0–5.0)
HCT: 43 % (ref 39.0–52.0)
Hemoglobin: 14.6 g/dL (ref 13.0–17.0)
Lymphocytes Relative: 21.8 % (ref 12.0–46.0)
Lymphs Abs: 1.4 K/uL (ref 0.7–4.0)
MCHC: 33.9 g/dL (ref 30.0–36.0)
MCV: 87.2 fl (ref 78.0–100.0)
Monocytes Absolute: 0.4 K/uL (ref 0.1–1.0)
Monocytes Relative: 6.2 % (ref 3.0–12.0)
Neutro Abs: 4.4 K/uL (ref 1.4–7.7)
Neutrophils Relative %: 69.8 % (ref 43.0–77.0)
Platelets: 210 K/uL (ref 150.0–400.0)
RBC: 4.93 Mil/uL (ref 4.22–5.81)
RDW: 13.6 % (ref 11.5–15.5)
WBC: 6.3 K/uL (ref 4.0–10.5)

## 2024-08-18 LAB — COMPREHENSIVE METABOLIC PANEL WITH GFR
ALT: 21 U/L (ref 3–53)
AST: 21 U/L (ref 5–37)
Albumin: 4.7 g/dL (ref 3.5–5.2)
Alkaline Phosphatase: 47 U/L (ref 39–117)
BUN: 21 mg/dL (ref 6–23)
CO2: 27 meq/L (ref 19–32)
Calcium: 9.3 mg/dL (ref 8.4–10.5)
Chloride: 104 meq/L (ref 96–112)
Creatinine, Ser: 0.71 mg/dL (ref 0.40–1.50)
GFR: 98.63 mL/min
Glucose, Bld: 87 mg/dL (ref 70–99)
Potassium: 4.1 meq/L (ref 3.5–5.1)
Sodium: 138 meq/L (ref 135–145)
Total Bilirubin: 1.2 mg/dL (ref 0.2–1.2)
Total Protein: 7.4 g/dL (ref 6.0–8.3)

## 2024-08-18 LAB — VITAMIN B12: Vitamin B-12: 337 pg/mL (ref 211–911)

## 2024-08-18 LAB — HEMOGLOBIN A1C: Hgb A1c MFr Bld: 5.6 % (ref 4.6–6.5)

## 2024-08-18 LAB — TSH: TSH: 0.86 u[IU]/mL (ref 0.35–5.50)

## 2024-08-18 MED ORDER — CITALOPRAM HYDROBROMIDE 40 MG PO TABS: 40.0000 mg | ORAL_TABLET | Freq: Every day | ORAL | 3 refills | Status: AC

## 2024-08-18 NOTE — Patient Instructions (Addendum)
 Change citalopram  to 40 mg daily 90 day supply- if not continuing to improve please see us  sooner than the 6 month visit  Please stop by lab before you go If you have mychart- we will send your results within 3 business days of us  receiving them.  If you do not have mychart- we will call you about results within 5 business days of us  receiving them.  *please also note that you will see labs on mychart as soon as they post. I will later go in and write notes on them- will say notes from Dr. Katrinka   Recommended follow up: Return in about 6 months (around 02/15/2025) for physical or sooner if needed.Schedule b4 you leave.

## 2024-08-18 NOTE — Progress Notes (Signed)
 " Phone [phone] In person visit   Subjective:   Eduardo Turner is a 64 y.o. year old very pleasant male patient who presents for/with See problem oriented charting Chief Complaint  Patient presents with   Medical Management of Chronic Issues    6 month follow up; would like to discuss the celexa ;    Past Medical History-  Patient Active Problem List   Diagnosis Date Noted   History of stroke 11/07/2019    Priority: High   Fatty liver 10/31/2021    Priority: Medium    History of adenomatous polyp of colon 03/19/2020    Priority: Medium    Hyperlipidemia 07/21/2016    Priority: Medium    GERD 09/19/2009    Priority: Medium    Recurrent major depression in full remission 06/21/2009    Priority: Medium    Essential hypertension 02/22/2007    Priority: Medium    Asthma 02/22/2007    Priority: Medium    Ophthalmic migraine 09/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   Dysarthria 01/03/2020    Medications- reviewed and updated Current Outpatient Medications  Medication Sig Dispense Refill   acetaminophen  (TYLENOL ) 325 MG tablet Take 650 mg by mouth.     albuterol  (VENTOLIN  HFA) 108 (90 Base) MCG/ACT inhaler Inhale 1-2 puffs into the lungs.     aspirin  EC 81 MG tablet Take 81 mg by mouth daily. Swallow whole.     cyclobenzaprine  (FLEXERIL ) 10 MG tablet Take 10 mg by mouth.     finasteride  (PROSCAR ) 5 MG tablet Take 1 tablet (5 mg total) by mouth daily. 90 tablet 3   lisinopril  (ZESTRIL ) 40 MG tablet TAKE 1 TABLET BY MOUTH DAILY 90 tablet 3   loratadine (CLARITIN) 10 MG tablet Take 10 mg by mouth daily.     omeprazole  (PRILOSEC) 20 MG capsule Take 1 capsule (20 mg total) by mouth daily. 90 capsule 3    rosuvastatin  (CRESTOR ) 40 MG tablet Take 1 tablet (40 mg total) by mouth daily. 90 tablet 3   sildenafil  (VIAGRA ) 100 MG tablet Take 1 tablet (100 mg total) by mouth daily as needed for erectile dysfunction. 10 tablet 11   citalopram  (CELEXA ) 40 MG tablet Take 1 tablet (40 mg total) by mouth daily. 90 tablet 3   No current facility-administered medications for this visit.     Objective:  BP 112/78 (BP Location: Left Arm, Patient Position: Sitting, Cuff Size: Normal)   Pulse 66   Temp 98.2 F (36.8 C) (Temporal)   Ht 5' 10 (1.778 m)   Wt 245 lb 12.8 oz (111.5 kg)   SpO2 95%   BMI 35.27 kg/m  Gen: NAD, resting comfortably CV: RRR no murmurs rubs or gallops Lungs: CTAB no crackles, wheeze, rhonchi Ext: no edema Skin: warm, dry     Assessment and Plan   # Right shoulder pain- something fell at work and he went to catch it with right arm and felt immediate strain- may have torn partial bicep tendon or other- working with [employer] on this. Saw [facility]- needs MRI- he's calling to follow up on that.    # Depression/situational stress S: Medication:Celexa  20Mg --> 30mg  -->  40 mg. He feels this recent change has helped- helped him navigate a few tough work situations  From note with stephanie Hudnell NP on 07/29/24 Over the past two weeks his mental health has worsened after previously doing well on Celexa , which was increased to 30 mg about eight months ago. 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 He usually takes Celexa  in the evenings and recently missed one dose. He lives with his wife, granddaughter, and grandson. His daughter is incarcerated and the children's father is in prison/rehab, and he is  carrying significant caregiving responsibilities at home. He is not involved in therapy, but his employer does offer services through their EAP.    08/18/2024    9:20 AM 02/15/2024   12:43 PM 08/19/2023    7:37 AM  Depression screen PHQ 2/9  Decreased Interest 0 0 1  Down, Depressed, Hopeless 0 0 1  PHQ - 2 Score 0 0 2  Altered sleeping 2 0 2  Tired, decreased energy 2 0 3  Change in appetite 0 0 0  Feeling bad or failure about yourself  0 0 0  Trouble concentrating 1 0 1  Moving slowly or fidgety/restless 0 0 0  Suicidal thoughts 0 0 0  PHQ-9 Score 5 0  8   Difficult doing work/chores Somewhat difficult Not difficult at all Not difficult at all     Data saved with a previous flowsheet row definition      08/18/2024    9:21 AM 08/19/2023    7:38 AM 12/04/2022    9:05 AM  GAD 7 : Generalized Anxiety Score  Nervous, Anxious, on Edge 1 2 1   Control/stop worrying 1 1 1   Worry too much - different things 1 1 1   Trouble relaxing 1 1 1   Restless 1 1 0  Easily annoyed or irritable 1 1 1   Afraid - awful might happen 1 1 0  Total GAD 7 Score 7 8 5   Anxiety Difficulty Somewhat difficult Somewhat difficult Not difficult at all  A/P: suspect #s are improving- still early with this change- we changed to 90 day supply citalopram  40 mg and I asked him to see me back within 1-2 months if he feels he is not making the strides he would like to make- may have to switch or augment therapy. He's trying to set up EAP.  -his granddaughter also getting support for what she went through- seeing psychiatry  #history of CVA 10/2019 cared for by [facility](dysarthria)- LDL goal under 70 #hyperlipidemia S: Medication: Rosuvastatin  40Mg , asa 81mg   Lab Results  Component Value Date   CHOL 146 02/15/2024   HDL 43.40 02/15/2024   LDLCALC 65 02/15/2024   LDLDIRECT 64 05/11/2020   TRIG 189.0 (H) 02/15/2024   CHOLHDL 3 02/15/2024  A/P: for history of stroke- LDL at goal. Triglyceride(s) mildly high- need to work on  healthy eating and regular exercise but right now focus has to be mental health. Continue current medications for now recheck next visit  # Asthma S: Maintenance Medication: None As needed medication: Albuterol . Patient is using this almost never- once in 3 months.  A/P: minimal issues- discussed SMART therapy abut opted with good control to stay stable   # GERD with small hiatal  hernia discovered 10/24/21 at [facility] S:Medication: Prilosec 20Mg  down from 40 mg. -failed Trial of h2 blocker in past Lab Results  Component Value Date   VITAMINB12 368 02/15/2024  - he bought some B12 but not sure if taking- we will recheck today A/P: gastroesophageal reflux disease well controlled continue current medications B12 low normal in past- update levels   #hypertension S: medication: Lisinopril  40Mg  BP Readings from Last 3 Encounters:  08/18/24 112/78  07/29/24 130/86  02/15/24 100/72  A/P: doing very well- continue current medications   #BPH- on finasteride  through urology Dr. Layman Hurst - cystoscopy for gross hematuria reassuring and CT abdomen pelvis 10/27/23 reassuring   #Fatty liver-incidentally discovered during cellulitis/sepsis work-up - bilirubin slightly high but his baseline - rest of liver looks good- update today. In long run once again focus on healthy eating and regular exercise and weight loss but as above focus on mental health as first step Lab Results  Component Value Date   ALT 19 02/15/2024   AST 20 02/15/2024   ALKPHOS 44 02/15/2024   BILITOT 1.5 (H) 02/15/2024    # Hyperglycemia/insulin resistance/prediabetes S:  Medication: none Lab Results  Component Value Date   HGBA1C 5.9 02/15/2024    A/P: prediabetes noted last visit- update values today- as above logn term goal healthy eating and regular exercise and weight loss  Recommended follow up: Return in about 6 months (around 02/15/2025) for physical or sooner if needed.Schedule b4 you leave.  Lab/Order  associations:   ICD-10-CM   1. Hyperlipidemia, unspecified hyperlipidemia type  E78.5 Comprehensive metabolic panel with GFR    CBC with Differential/Platelet    TSH    2. Immunization due  Z23 Flu vaccine trivalent PF, 6mos and older(Flulaval,Afluria,Fluarix,Fluzone)    3. Screening for diabetes mellitus  Z13.1 Hemoglobin A1c    4. Obesity (BMI 30-39.9)  E66.9 Hemoglobin A1c    5. Essential hypertension  I10 Comprehensive metabolic panel with GFR    CBC with Differential/Platelet    6. Fatty liver  K76.0     7. Recurrent major depression in full remission  F33.42 citalopram  (CELEXA ) 40 MG tablet    8. High risk medication use  Z79.899 Vitamin B12      Meds ordered this encounter  Medications   citalopram  (CELEXA ) 40 MG tablet    Sig: Take 1 tablet (40 mg total) by mouth daily.    Dispense:  90 tablet    Refill:  3    Return precautions advised.  Garnette Lukes, MD  "

## 2024-09-15 ENCOUNTER — Telehealth: Payer: Self-pay | Admitting: Family Medicine

## 2024-09-15 NOTE — Telephone Encounter (Signed)
 LVM to r/s appt from 02/20/25 for physical

## 2025-02-20 ENCOUNTER — Encounter: Admitting: Family Medicine
